# Patient Record
Sex: Male | Born: 1975 | Race: Black or African American | Hispanic: No | Marital: Single | Smoking: Former smoker
Health system: Southern US, Community
[De-identification: ages and names within clinical notes are randomized; demographics above are authoritative.]

## PROBLEM LIST (undated history)

## (undated) DIAGNOSIS — F329 Major depressive disorder, single episode, unspecified: Secondary | ICD-10-CM

## (undated) DIAGNOSIS — Z21 Asymptomatic human immunodeficiency virus [HIV] infection status: Secondary | ICD-10-CM

## (undated) DIAGNOSIS — H6690 Otitis media, unspecified, unspecified ear: Secondary | ICD-10-CM

## (undated) DIAGNOSIS — R0789 Other chest pain: Secondary | ICD-10-CM

## (undated) DIAGNOSIS — B20 Human immunodeficiency virus [HIV] disease: Secondary | ICD-10-CM

## (undated) DIAGNOSIS — A549 Gonococcal infection, unspecified: Secondary | ICD-10-CM

## (undated) DIAGNOSIS — I10 Essential (primary) hypertension: Secondary | ICD-10-CM

## (undated) DIAGNOSIS — J029 Acute pharyngitis, unspecified: Secondary | ICD-10-CM

## (undated) DIAGNOSIS — K219 Gastro-esophageal reflux disease without esophagitis: Secondary | ICD-10-CM

## (undated) DIAGNOSIS — J302 Other seasonal allergic rhinitis: Secondary | ICD-10-CM

## (undated) HISTORY — DX: Otitis media, unspecified, unspecified ear: H66.90

## (undated) HISTORY — DX: Other seasonal allergic rhinitis: J30.2

## (undated) HISTORY — DX: Acute pharyngitis, unspecified: J02.9

## (undated) HISTORY — DX: Major depressive disorder, single episode, unspecified: F32.9

---
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
## 2007-09-08 ENCOUNTER — Emergency Department (HOSPITAL_COMMUNITY): Admission: EM | Admit: 2007-09-08 | Discharge: 2007-09-08 | Payer: Self-pay | Admitting: Emergency Medicine

## 2011-03-16 ENCOUNTER — Emergency Department (HOSPITAL_COMMUNITY)
Admission: EM | Admit: 2011-03-16 | Discharge: 2011-03-16 | Disposition: A | Discharge status: Home / Self Care | Payer: Self-pay | Attending: Emergency Medicine | Admitting: Emergency Medicine

## 2011-03-16 DIAGNOSIS — R1084 Generalized abdominal pain: Secondary | ICD-10-CM | POA: Insufficient documentation

## 2011-03-16 DIAGNOSIS — R197 Diarrhea, unspecified: Secondary | ICD-10-CM | POA: Insufficient documentation

## 2011-03-16 DIAGNOSIS — R112 Nausea with vomiting, unspecified: Secondary | ICD-10-CM | POA: Insufficient documentation

## 2011-03-16 DIAGNOSIS — R42 Dizziness and giddiness: Secondary | ICD-10-CM | POA: Insufficient documentation

## 2011-03-16 LAB — DIFFERENTIAL
Basophils Relative: 0 % (ref 0–1)
Eosinophils Absolute: 0 10*3/uL (ref 0.0–0.7)
Eosinophils Relative: 0 % (ref 0–5)
Lymphs Abs: 1.4 10*3/uL (ref 0.7–4.0)
Monocytes Relative: 6 % (ref 3–12)

## 2011-03-16 LAB — CBC
HCT: 47.2 % (ref 39.0–52.0)
Hemoglobin: 16.6 g/dL (ref 13.0–17.0)
MCH: 32 pg (ref 26.0–34.0)
MCHC: 35.2 g/dL (ref 30.0–36.0)
MCV: 90.9 fL (ref 78.0–100.0)
Platelets: 264 10*3/uL (ref 150–400)
RBC: 5.19 MIL/uL (ref 4.22–5.81)
RDW: 11.9 % (ref 11.5–15.5)
WBC: 13.7 10*3/uL — ABNORMAL HIGH (ref 4.0–10.5)

## 2011-03-16 LAB — COMPREHENSIVE METABOLIC PANEL
AST: 20 U/L (ref 0–37)
Albumin: 4 g/dL (ref 3.5–5.2)
Calcium: 9.8 mg/dL (ref 8.4–10.5)
Creatinine, Ser: 0.87 mg/dL (ref 0.50–1.35)
GFR calc non Af Amer: >60 mL/min (ref >60)
Sodium: 138 mEq/L (ref 135–145)
Total Protein: 8.6 g/dL — ABNORMAL HIGH (ref 6.0–8.3)

## 2011-09-04 ENCOUNTER — Emergency Department (HOSPITAL_COMMUNITY)
Admission: EM | Admit: 2011-09-04 | Discharge: 2011-09-05 | Disposition: A | Discharge status: Home / Self Care | Payer: Self-pay | Attending: Emergency Medicine | Admitting: Emergency Medicine

## 2011-09-04 ENCOUNTER — Encounter (HOSPITAL_COMMUNITY): Payer: Self-pay | Admitting: *Deleted

## 2011-09-04 DIAGNOSIS — R599 Enlarged lymph nodes, unspecified: Secondary | ICD-10-CM | POA: Insufficient documentation

## 2011-09-04 DIAGNOSIS — J351 Hypertrophy of tonsils: Secondary | ICD-10-CM | POA: Insufficient documentation

## 2011-09-04 DIAGNOSIS — J36 Peritonsillar abscess: Secondary | ICD-10-CM

## 2011-09-04 DIAGNOSIS — R070 Pain in throat: Secondary | ICD-10-CM | POA: Insufficient documentation

## 2011-09-04 NOTE — ED Notes (Signed)
The pt  Has had a sorethroat for 4 days with a temp

## 2011-09-05 LAB — STREP A DNA PROBE: Group A Strep Probe: POSITIVE

## 2011-09-05 MED ORDER — HYDROCODONE-ACETAMINOPHEN 5-325 MG PO TABS
1.0000 | ORAL_TABLET | Freq: Once | ORAL | Status: AC
  Administered 2011-09-05: 1 via ORAL
  Filled 2011-09-05: qty 1

## 2011-09-05 MED ORDER — CLINDAMYCIN PHOSPHATE 900 MG/50ML IV SOLN
900.0000 mg | INTRAVENOUS | Status: AC
  Administered 2011-09-05: 900 mg via INTRAVENOUS
  Filled 2011-09-05: qty 50

## 2011-09-05 MED ORDER — SODIUM CHLORIDE 0.9 % IV BOLUS (SEPSIS)
1000.0000 mL | Freq: Once | INTRAVENOUS | Status: AC
  Administered 2011-09-05: 1000 mL via INTRAVENOUS

## 2011-09-05 MED ORDER — CLINDAMYCIN HCL 150 MG PO CAPS: 300.0000 mg | ORAL_CAPSULE | Freq: Three times a day (TID) | ORAL | Status: AC

## 2011-09-05 MED ORDER — HYDROCODONE-ACETAMINOPHEN 5-325 MG PO TABS: 1.0000 | ORAL_TABLET | Freq: Four times a day (QID) | ORAL | Status: AC | PRN

## 2011-09-05 NOTE — ED Notes (Signed)
Patient given copy of discharge paperwork; went over discharge instructions with patient.  Instructed patient to follow up with Dr. Annalee Genta at 9 am this morning, to take antibiotic and pain medication as directed, and to return to the ED for new, worsening, or concerning symptoms.

## 2011-09-05 NOTE — ED Provider Notes (Signed)
Medical screening examination/treatment/procedure(s) were performed by non-physician practitioner and as supervising physician I was immediately available for consultation/collaboration.  Flint Melter, MD 09/05/11 [phone]

## 2011-09-05 NOTE — ED Provider Notes (Signed)
Medical screening examination/treatment/procedure(s) were performed by non-physician practitioner and as supervising physician I was immediately available for consultation/collaboration.  Patient symptomatic for 5 days. He has clinical peritonsillar abscess and may exposure to streptococcal pharyngitis. Patient is currently mild distress. The PA will contact the ENT doctor to help arrange disposition/treatment.  Flint Melter, MD 09/05/11 Marlyne Beards

## 2011-09-05 NOTE — ED Notes (Signed)
Patient currently sitting up in bed; no respiratory or acute distress noted.  Patient updated on plan of care; informed patient that we are currently waiting on clindamycin to be delivered from pharmacy.  Patient has no other questions or concerns at this time; will continue to monitor.

## 2011-09-05 NOTE — ED Provider Notes (Signed)
History     CSN: [id number]  Arrival date & time 09/04/11  2215   First MD Initiated Contact with Patient 09/04/11 2328      Chief Complaint  Patient presents with  . Sore Throat    HPI  History provided by the patient. Patient is a 36 year old male with no significant past medical history who presents with complaints of sore throat for the past 4-5 days. Patient reports having acute onset of sore throat and taking a leftover Augmentin pill from old prescription and having some improvement of pains only to have return of significant right-sided sore throat and pain the past few days. he has been taking Tylenol and some ibuprofen for his pain without any improvement. He has been constant. Pain is worse with swallowing. Patient denies any difficulty swallowing or breathing. He reports feeling warm but has not taken his temperature. He denies having chills, sweats, nausea or vomiting. Patient has no other significant medical problems.   History reviewed. No pertinent past medical history.  History reviewed. No pertinent past surgical history.  History reviewed. No pertinent family history.  History  Substance Use Topics  . Smoking status: Current Everyday Smoker  . Smokeless tobacco: Not on file  . Alcohol Use: Yes      Review of Systems  Constitutional: Negative for fever, chills and diaphoresis.  HENT: Positive for sore throat.   Respiratory: Negative for cough.   Gastrointestinal: Negative for nausea and vomiting.  All other systems reviewed and are negative.    Allergies  Review of patient's allergies indicates no known allergies.  Home Medications   Current Outpatient Rx  Name Route Sig Dispense Refill  . ACETAMINOPHEN 500 MG PO TABS Oral Take 1,000 mg by mouth every 6 (six) hours as needed. For pain    . AMOXICILLIN-POT CLAVULANATE 875-125 MG PO TABS Oral Take 1 tablet by mouth daily.    . ADULT MULTIVITAMIN W/MINERALS CH Oral Take 1 tablet by mouth daily.       BP 130/91  Pulse 85  Temp(Src) 98.6 F (37 C) (Oral)  Resp 18  SpO2 97%  Physical Exam  Nursing note and vitals reviewed. Constitutional: He is oriented to person, place, and time. He appears well-developed and well-nourished.  HENT:  Head: Normocephalic and atraumatic.       Leftward deviation of the uvula with prominence of swelling around the right peritonsillar area. Tonsils with exudate. Pharynx erythematous. Voice is slightly muffled.  Neck: Normal range of motion. Neck supple. No tracheal deviation present.  Cardiovascular: Normal rate and regular rhythm.   Pulmonary/Chest: Effort normal and breath sounds normal. No respiratory distress. He has no wheezes. He has no rales.  Abdominal: Soft.  Lymphadenopathy:    He has cervical adenopathy.  Neurological: He is alert and oriented to person, place, and time.  Skin: Skin is warm. No rash noted. No erythema.  Psychiatric: He has a normal mood and affect. His behavior is normal.    ED Course  Procedures    Labs Reviewed  RAPID STREP SCREEN   Results for orders placed during the hospital encounter of 09/04/11  RAPID STREP SCREEN      Component Value Range   Streptococcus, Group A Screen (Direct) NEGATIVE  NEGATIVE       1. Peritonsillar abscess       MDM  11:45 PM patient seen and evaluated. Patient no acute distress.  Patient seen and discussed with attending physician. Plan to consult ENT specialist for further  recommendations.   12:30AM  I spoke with Dr. Annalee Genta on-call with ENT. He recommends giving the patient IV fluids to rehydrate him and to give 900 mg IV clindamycin. He would like to see patient in the office tomorrow. Patient has been instructed to call the office at 9 AM. Plan to also provide prescription for clindamycin and pain medication.      Angus Seller, [place] 09/05/11 [phone]

## 2011-09-05 NOTE — ED Notes (Signed)
Patient currently sitting up in bed; no respiratory or acute distress noted.  Patient updated on plan of care; informed patient that he can be discharged home as soon as his 1 liter NS bolus is finished.  Patient has no other questions or concerns at this time; family present at bedside.  Will continue to monitor.

## 2011-09-06 ENCOUNTER — Emergency Department (HOSPITAL_COMMUNITY)
Admission: EM | Admit: 2011-09-06 | Discharge: 2011-09-06 | Disposition: A | Discharge status: Home / Self Care | Payer: Self-pay | Attending: Otolaryngology | Admitting: Otolaryngology

## 2011-09-06 ENCOUNTER — Encounter (HOSPITAL_COMMUNITY): Payer: Self-pay | Admitting: Emergency Medicine

## 2011-09-06 DIAGNOSIS — R509 Fever, unspecified: Secondary | ICD-10-CM | POA: Insufficient documentation

## 2011-09-06 DIAGNOSIS — R259 Unspecified abnormal involuntary movements: Secondary | ICD-10-CM | POA: Insufficient documentation

## 2011-09-06 DIAGNOSIS — J36 Peritonsillar abscess: Secondary | ICD-10-CM | POA: Insufficient documentation

## 2011-09-06 DIAGNOSIS — R599 Enlarged lymph nodes, unspecified: Secondary | ICD-10-CM | POA: Insufficient documentation

## 2011-09-06 DIAGNOSIS — J351 Hypertrophy of tonsils: Secondary | ICD-10-CM | POA: Insufficient documentation

## 2011-09-06 DIAGNOSIS — J029 Acute pharyngitis, unspecified: Secondary | ICD-10-CM | POA: Insufficient documentation

## 2011-09-06 MED ORDER — CLINDAMYCIN PHOSPHATE 600 MG/4ML IJ SOLN
INTRAMUSCULAR | Status: AC
  Filled 2011-09-06: qty 4

## 2011-09-06 MED ORDER — DEXTROSE 5 % IV SOLN
INTRAVENOUS | Status: AC
  Filled 2011-09-06: qty 50

## 2011-09-06 MED ORDER — LIDOCAINE-EPINEPHRINE 1 %-1:100000 IJ SOLN
INTRAMUSCULAR | Status: AC
  Filled 2011-09-06: qty 1

## 2011-09-06 MED ORDER — CLINDAMYCIN PHOSPHATE 600 MG/50ML IV SOLN
600.0000 mg | INTRAVENOUS | Status: AC
  Administered 2011-09-06: 600 mg via INTRAVENOUS
  Filled 2011-09-06: qty 50

## 2011-09-06 MED ORDER — MORPHINE SULFATE 4 MG/ML IJ SOLN
4.0000 mg | Freq: Once | INTRAMUSCULAR | Status: AC
  Administered 2011-09-06: 4 mg via INTRAVENOUS
  Filled 2011-09-06: qty 1

## 2011-09-06 NOTE — Progress Notes (Signed)
Reason for Consult:Acute tonsillitis and Rt PTA Referring Physician: EDP  Jayquan Bradsher is an 36 y.o. male.  HPI: Hx of progressive ST and fever, pt txed with Cleocin. Cont trismus, fever and ST.  History reviewed. No pertinent past medical history.  History reviewed. No pertinent past surgical history.  No family history on file.  Social History:  reports that he has been smoking.  He does not have any smokeless tobacco history on file. He reports that he drinks alcohol. His drug history not on file.  Allergies: No Known Allergies  Medications: I have reviewed the patient's current medications.  Results for orders placed during the hospital encounter of 09/04/11 (from the past 48 hour(s))  RAPID STREP SCREEN     Status: Normal   Collection Time   09/04/11 10:14 PM      Component Value Range Comment   Streptococcus, Group A Screen (Direct) NEGATIVE  NEGATIVE    STREP A DNA PROBE     Status: Normal   Collection Time   09/04/11 10:14 PM      Component Value Range Comment   Specimen Description THROAT      Special Requests ADDED 09/05/11 0024      Group A Strep Probe POSITIVE      Report Status 09/06/2011 FINAL       No results found.  ROS:per ER adm  Blood pressure 145/97, pulse 99, temperature 100.4 F (38 C), temperature source Oral, resp. rate 18, SpO2 97.00%.  PHYSICAL EXAM: Overall appearance:  Healthy appearing, in no distress Head:  Normocephalic, atraumatic. Ears: External auditory canals are clear; tympanic membranes are intact in the middle ears are free of any effusion. Nose: External nose is healthy in appearance. Internal nasal exam free of any lesions or obstruction. Oral Cavity:  3+ tonsils with exudate, Rt PTA, trismus.  Neuro:  No identifiable neurologic deficits. Neck: Bil LA   Studies Reviewed:N/A  Assessment/Plan: I&D Rt PTA, no complications. Cont current po cleocin and prn pain meds, Motrin 600 tid, increase po fluids. Plan f/u in 2 - 3  wks  Arminta Gamm L 09/06/2011, 4:46 PM

## 2011-09-06 NOTE — ED Provider Notes (Signed)
Medical screening examination/treatment/procedure(s) were conducted as a shared visit with non-physician practitioner(s) and myself.  I personally evaluated the patient during the encounter   Joya Gaskins, MD 09/06/11 [phone]

## 2011-09-06 NOTE — ED Notes (Signed)
Dr Shoemaker at bedside. 

## 2011-09-06 NOTE — ED Notes (Signed)
Was seen on 1/13 for sore throat dx w/ peritonsilar abcess pt states is no better

## 2011-09-06 NOTE — ED Notes (Signed)
Awaiting dr Annalee Genta. ent cart and tray at bedside.

## 2011-09-06 NOTE — ED Notes (Signed)
Pt states got rx filled and has been taking

## 2011-09-06 NOTE — ED Provider Notes (Signed)
History     CSN: [id number]  Arrival date & time 09/06/11  1227   First MD Initiated Contact with Patient 09/06/11 1455      Chief Complaint  Patient presents with  . Sore Throat    Patient is a 36 y.o. male presenting with pharyngitis. The history is provided by the patient.  Sore Throat This is a recurrent problem. The current episode started more than 2 days ago. The problem occurs hourly. The problem has been gradually worsening. Pertinent negatives include no chest pain and no shortness of breath. The symptoms are aggravated by swallowing. The symptoms are relieved by nothing.  his course is worsening He reports recent diagnosis of peritonsillar abscess, started on antibiotics, was supposed to f/u with ENT, he called them and they told him to go to the ER for evaluation.    No cp/sob No drooling No focal weakness  PMH - none  History reviewed. No pertinent past surgical history.  No family history on file.  History  Substance Use Topics  . Smoking status: Current Everyday Smoker  . Smokeless tobacco: Not on file  . Alcohol Use: Yes      Review of Systems  Respiratory: Negative for shortness of breath.   Cardiovascular: Negative for chest pain.  All other systems reviewed and are negative.    Allergies  Review of patient's allergies indicates no known allergies.  Home Medications   Current Outpatient Rx  Name Route Sig Dispense Refill  . ACETAMINOPHEN 500 MG PO TABS Oral Take 1,000 mg by mouth every 6 (six) hours as needed. For pain    . CLINDAMYCIN HCL 150 MG PO CAPS Oral Take 2 capsules (300 mg total) by mouth 3 (three) times daily. 42 capsule 0  . HYDROCODONE-ACETAMINOPHEN 5-325 MG PO TABS Oral Take 1 tablet by mouth every 6 (six) hours as needed for pain. 30 tablet 0  . ADULT MULTIVITAMIN W/MINERALS CH Oral Take 1 tablet by mouth daily.      BP 145/97  Pulse 99  Temp(Src) 100.4 F (38 C) (Oral)  Resp 18  SpO2 97%  Physical Exam CONSTITUTIONAL:  Well developed/well nourished HEAD AND FACE: Normocephalic/atraumatic EYES: EOMI/PERRL ENMT: Mucous membranes moist, right PTA noted with deviation of uvula to left No stridor NECK: supple no meningeal signs SPINE:entire spine nontender CV: S1/S2 noted, no murmurs/rubs/gallops noted LUNGS: Lungs are clear to auscultation bilaterally, no apparent distress ABDOMEN: soft, nontender, no rebound or guarding GU:no cva tenderness NEURO: Pt is awake/alert, moves all extremitiesx4 EXTREMITIES: pulses normal, full ROM SKIN: warm, color normal PSYCH: no abnormalities of mood noted  ED Course  Procedures    3:26 PM D/w dr Annalee Genta, he is going to see patient in and drain PTA Pt stable at this time  MDM  Nursing notes reviewed and considered in documentation Previous records reviewed and considered         Joya Gaskins, MD 09/06/11 1526

## 2011-09-06 NOTE — ED Provider Notes (Signed)
Patient has been seen and treated by Dr. Annalee Genta.  His instructions are to continue current po cleocin and prn pain meds, Motrin 600 tid, increase po fluids. Plan f/u in 2 - 3 wks.   Jimmye Norman, NP 09/06/11 1845

## 2011-09-06 NOTE — Progress Notes (Signed)
09/06/2011 4:53 PM    Jennye Moccasin  [id number]   Pre-Op Dx:  Elinor Parkinson  Peritonsillar Abscess  Post-op Dx: same  Proc:  I&D Rt PTA  Surg: Kary Kos: local  EBL:  min  Comp:  none  Findings:  C/w PTA  Procedure: Local anesth. I&D Rt PTA for mod purulent d/c.  No complications  Dispo:  D/c to home  Plan:  Hydration, antibiosis, analgesia.   Given low anticipated risk of post-anesthetic or post-surgical complications, feel an outpatient venue is appropriate.  Osborn Coho, MD

## 2011-09-06 NOTE — ED Notes (Signed)
Consulting md to drain abcess at bedside.

## 2012-02-08 ENCOUNTER — Encounter (HOSPITAL_COMMUNITY): Payer: Self-pay | Admitting: Emergency Medicine

## 2012-02-08 ENCOUNTER — Emergency Department (HOSPITAL_COMMUNITY)
Admission: EM | Admit: 2012-02-08 | Discharge: 2012-02-08 | Disposition: A | Discharge status: Home / Self Care | Payer: BC Managed Care – PPO | Attending: Emergency Medicine | Admitting: Emergency Medicine

## 2012-02-08 DIAGNOSIS — F17200 Nicotine dependence, unspecified, uncomplicated: Secondary | ICD-10-CM | POA: Insufficient documentation

## 2012-02-08 DIAGNOSIS — J069 Acute upper respiratory infection, unspecified: Secondary | ICD-10-CM

## 2012-02-08 DIAGNOSIS — H6980 Other specified disorders of Eustachian tube, unspecified ear: Secondary | ICD-10-CM

## 2012-02-08 DIAGNOSIS — H6990 Unspecified Eustachian tube disorder, unspecified ear: Secondary | ICD-10-CM | POA: Insufficient documentation

## 2012-02-08 MED ORDER — AMOXICILLIN 500 MG PO CAPS: 500.0000 mg | ORAL_CAPSULE | Freq: Three times a day (TID) | ORAL | Status: AC

## 2012-02-08 MED ORDER — AMOXICILLIN 500 MG PO CAPS: 500.0000 mg | ORAL_CAPSULE | Freq: Three times a day (TID) | ORAL | Status: DC

## 2012-02-08 MED ORDER — FLUTICASONE PROPIONATE 50 MCG/ACT NA SUSP: 2.0000 | Freq: Every day | NASAL | Status: DC

## 2012-02-08 NOTE — Discharge Instructions (Signed)
Take motrin every 6 hours for discomfort. Return for worsening symptoms  RESOURCE GUIDE  Chronic Pain Problems: Contact [facility]  [phone] Patients need to be referred by their primary care doctor.  Insufficient Money for Medicine: Contact United Way:  call "211" or [facility] 8594999929.  No Primary Care Doctor: - Call [facility]  [phone] - can help you locate a primary care doctor that  accepts your insurance, provides certain services, etc. - Physician Referral Service- [phone]  Agencies that provide inexpensive medical care: - [facility]  [phone] - [facility]  [phone] - [facility]  [phone] - Women's Clinic  [phone] - Planned Parenthood  [phone] [facility]  [phone]  Medicaid-accepting [facility] Providers: - [facility]- [address]  [phone], Mon-Fri 9am-7pm, Sat 9am-1pm - [facility]- [address] [place], [address] [place]  [phone] - [facility]- [address] [place]  [phone] [facility]- [address]  [phone] - Renaye Rakers- [address] [place], [address], [phone]  Only accepts [place] patients after they have their name  applied to their card  Self Pay (no insurance) in [place]: - Sickle Cell Patients: Dr Willey Blade, [facility]  [address] [place], [phone] - [facility]- [address] [place]  [phone]       [facility] [place]- [address]       -     [facility]- see information above (Speak to [employer] if you do not have insurance)       -  [facility]- [address] [place], [phone]       -  [facility]- [address],  [phone]       -  [facility]- [address], [phone]       -  Dr Julio Sicks-  [address], [address], [place], [phone]       -  [facility]- [address] [phone]       -  [facility]- [address] [phone], also [address], [phone]       -    [facility]- [address] [place], [phone], 1st & 3rd Saturday   every month, 10am-1pm  1) Find a Doctor and Pay Out of Pocket Although you won't have to find out who is covered by your insurance plan, it is a good idea to ask around and get recommendations. You will then need to call the office and see if the doctor you have chosen will accept you as a new patient and what types of options they offer for patients who are self-pay. Some doctors offer discounts or will set up payment plans for their patients who do not have insurance, but you will need to ask so you aren't surprised when you get to your appointment.  2) Contact Your Local Health Department Not all health departments have doctors that can see patients for sick visits, but many do, so it is worth a call to see if yours does. If you don't know where your local health department is, you can check in your phone book. The CDC also has a tool to help  you locate your state's health department, and many state websites also have listings of all of their local health departments.  3) Find a Walk-in Clinic If your illness is not likely to be very severe or complicated, you may want to try a walk in clinic. These are popping up all over the country in pharmacies, drugstores, and shopping centers. They're usually staffed by nurse practitioners or physician assistants that have been trained to treat common illnesses and complaints. They're usually fairly quick and inexpensive. However, if you have serious medical issues or chronic medical problems, these are probably not your best option  STD Testing - [facility] Department of [facility] [place], STD Clinic, [address] [place], phone [phone] or [phone].   Monday - Friday, call for an appointment. [facility] Department of [employer], STD Clinic, [address], [place], phone [phone] or [phone].  Monday - Friday, call for an appointment.  Abuse/Neglect: [facility] Child Abuse Hotline 716-815-6143 [facility] Child Abuse Hotline 636 318 4732 (After Hours)  Emergency Shelter:  [facility] [phone]  Maternity Homes: - Room at the [place] [facility] 9597857098 - [facility] [phone]  MRSA Hotline #:   (616) 562-0647  [facility] Resources  [facility]  [employer] [facility] Dept. [address]                 [address]         [address] [place] [address]  Blondell Reveal Phone:  [phone]                                  Phone:  [phone]                   Phone:  [phone]  [facility], [phone] - [phone]       -     [facility] in [place], [address]                                  ([phone], [facility] Child Abuse Hotline 940-040-1236 or 360-127-1717 (After Hours)   Behavioral Health Services  Substance Abuse Resources: - [facility] Drug Services  805-094-0852 - [facility] [phone] - [facility] [phone] Floydene Flock [phone] - Residential & Outpatient Substance Abuse Program  [phone]  Psychological Services: [facility]  ([phone] Services  [phone] - [facility], [phone] [place]. [address] [place], ACCESS LINE: 8731556106 or ([phone], [employer]  Dental Assistance  If unable to pay or uninsured, contact:  [facility] or [facility]. to become qualified for the adult dental clinic.  Patients with Medicaid: [facility] [phone]  Sarina Ser, [phone] [address] [phone]  If unable to pay, or uninsured, contact [facility] (807)[phone]) or [facility] Department [phone] in [place], [phone] in [facility]) to become qualified for the adult dental clinic  Other Low-Cost Community Dental Services: - [facility]- [address] [place], [place], [place], [address], [phone], [address]. [address], 2nd and 4th Thursday of the month at 6:30am.  10 clients each day by appointment, can sometimes see walk-in patients if someone does not show for an appointment. [facility]- [address] [place], [place], [address], [phone] - [facility]- [address] [place], [place], [address], [phone] - [facility]- [phone] [facility] Health Department- [phone] [facility]- [phone]

## 2012-02-08 NOTE — ED Provider Notes (Signed)
Medical screening examination/treatment/procedure(s) were performed by non-physician practitioner and as supervising physician I was immediately available for consultation/collaboration.  Cheri Guppy, MD 02/08/12 [phone]

## 2012-02-08 NOTE — ED Notes (Signed)
Pt. Stated, I'm having ear pain for 2 weeks with nasal congestion and some chest pain.

## 2012-02-08 NOTE — ED Provider Notes (Signed)
History     CSN: [id number]  Arrival date & time 02/08/12  1000   First MD Initiated Contact with Patient 02/08/12 1103      Chief Complaint  Patient presents with  . Otalgia    (Consider location/radiation/quality/duration/timing/severity/associated sxs/prior treatment) Patient is a 36 y.o. male presenting with ear pain. The history is provided by the patient.  Otalgia The current episode started more than 1 week ago. There is pain in the left ear. The problem occurs daily. The problem has been gradually worsening. There has been no fever. Pertinent negatives include no cough.   36 y/o male iNAD c/o left ear pressure/popping sensation with purulent nasal drainage x2wks. Denies fever, cough and SOB. Pt also reports a sternal chest pain that predominantly occurs at night x2 months. No FH of cardiac disease. Pt also had single episode of NBNB emesis and diarrhea x2 days ago.    History reviewed. No pertinent past medical history.  History reviewed. No pertinent past surgical history.  No family history on file.  History  Substance Use Topics  . Smoking status: Current Everyday Smoker    Types: Cigarettes  . Smokeless tobacco: Not on file  . Alcohol Use: Yes      Review of Systems  Constitutional: Negative for fever.  HENT: Positive for ear pain.   Respiratory: Negative for cough and shortness of breath.   Cardiovascular: Positive for chest pain. Negative for palpitations and leg swelling.  All other systems reviewed and are negative.    Allergies  Review of patient's allergies indicates not on file.  Home Medications   Current Outpatient Rx  Name Route Sig Dispense Refill  . ADULT MULTIVITAMIN W/MINERALS CH Oral Take 1 tablet by mouth daily.      BP 136/87  Pulse 80  Temp 98 F (36.7 C) (Oral)  Resp 19  SpO2 97%  Physical Exam  Nursing note and vitals reviewed. Constitutional: He is oriented to person, place, and time. He appears well-developed and  well-nourished. No distress.  HENT:  Head: Normocephalic and atraumatic.  Right Ear: External ear normal.  Left Ear: External ear normal.  Nose: Nose normal.  Mouth/Throat: Oropharynx is clear and moist. No oropharyngeal exudate.       Air fluid level in both ears  Eyes: Conjunctivae and EOM are normal. Pupils are equal, round, and reactive to light.  Neck: Normal range of motion. Neck supple.  Cardiovascular: Normal rate and regular rhythm.   Pulmonary/Chest: Effort normal and breath sounds normal. No respiratory distress. He has no wheezes. He has no rales. He exhibits no tenderness.  Abdominal: Soft. Bowel sounds are normal.  Musculoskeletal: Normal range of motion.  Neurological: He is alert and oriented to person, place, and time.  Skin: Skin is warm and dry. He is not diaphoretic.  Psychiatric: He has a normal mood and affect. His behavior is normal.    ED Course  Procedures (including critical care time)  Labs Reviewed - No data to display No results found.   1. Eustachian tube dysfunction   2. URI (upper respiratory infection)       MDM  36 y/o mael with left ear pressure and runny nose worsening over the course of 2 weeks. Will treat with amoxicillin and fluticasone.  Pt verbalized understanding and agrees with care plan. Outpatient follow-up and return precautions given.           Wynetta Emery, PA-C 02/08/12 1209

## 2012-02-09 ENCOUNTER — Encounter (HOSPITAL_COMMUNITY): Payer: Self-pay | Admitting: Emergency Medicine

## 2012-08-14 ENCOUNTER — Emergency Department (HOSPITAL_COMMUNITY)
Admission: EM | Admit: 2012-08-14 | Discharge: 2012-08-14 | Disposition: A | Discharge status: Home / Self Care | Payer: BC Managed Care – PPO | Attending: Emergency Medicine | Admitting: Emergency Medicine

## 2012-08-14 ENCOUNTER — Encounter (HOSPITAL_COMMUNITY): Payer: Self-pay | Admitting: Emergency Medicine

## 2012-08-14 DIAGNOSIS — Z8619 Personal history of other infectious and parasitic diseases: Secondary | ICD-10-CM | POA: Insufficient documentation

## 2012-08-14 DIAGNOSIS — J309 Allergic rhinitis, unspecified: Secondary | ICD-10-CM | POA: Insufficient documentation

## 2012-08-14 DIAGNOSIS — A64 Unspecified sexually transmitted disease: Secondary | ICD-10-CM | POA: Insufficient documentation

## 2012-08-14 DIAGNOSIS — H9209 Otalgia, unspecified ear: Secondary | ICD-10-CM | POA: Insufficient documentation

## 2012-08-14 DIAGNOSIS — A549 Gonococcal infection, unspecified: Secondary | ICD-10-CM | POA: Insufficient documentation

## 2012-08-14 DIAGNOSIS — N342 Other urethritis: Secondary | ICD-10-CM

## 2012-08-14 DIAGNOSIS — F17200 Nicotine dependence, unspecified, uncomplicated: Secondary | ICD-10-CM | POA: Insufficient documentation

## 2012-08-14 DIAGNOSIS — Z7251 High risk heterosexual behavior: Secondary | ICD-10-CM | POA: Insufficient documentation

## 2012-08-14 DIAGNOSIS — R300 Dysuria: Secondary | ICD-10-CM | POA: Insufficient documentation

## 2012-08-14 DIAGNOSIS — J3489 Other specified disorders of nose and nasal sinuses: Secondary | ICD-10-CM | POA: Insufficient documentation

## 2012-08-14 HISTORY — DX: Gonococcal infection, unspecified: A54.9

## 2012-08-14 MED ORDER — CEFTRIAXONE SODIUM 250 MG IJ SOLR
250.0000 mg | Freq: Once | INTRAMUSCULAR | Status: AC
  Administered 2012-08-14: 250 mg via INTRAMUSCULAR
  Filled 2012-08-14: qty 250

## 2012-08-14 MED ORDER — FLUTICASONE PROPIONATE 50 MCG/ACT NA SUSP: 2.0000 | Freq: Every day | NASAL | Status: DC

## 2012-08-14 MED ORDER — AZITHROMYCIN 250 MG PO TABS
1000.0000 mg | ORAL_TABLET | Freq: Once | ORAL | Status: AC
  Administered 2012-08-14: 1000 mg via ORAL
  Filled 2012-08-14: qty 4

## 2012-08-14 MED ORDER — OXYMETAZOLINE HCL 0.05 % NA SOLN
1.0000 | Freq: Once | NASAL | Status: AC
  Administered 2012-08-14: 1 via NASAL
  Filled 2012-08-14: qty 15

## 2012-08-14 MED ORDER — LIDOCAINE HCL (PF) 1 % IJ SOLN
INTRAMUSCULAR | Status: AC
  Filled 2012-08-14: qty 5

## 2012-08-14 NOTE — ED Notes (Signed)
Pt c/o bilateral ear pain x 1.5 months; pt sts penile discharge x 3 days with burning with urination; pt sts has had unprotected sex

## 2012-08-14 NOTE — ED Notes (Signed)
Pt states "my nose is steady running and I cant breathe or smell through my nose and my ears feel like they on an airplane." Pt also c/o burning with urination and a yellow penile discharge.

## 2012-08-14 NOTE — ED Provider Notes (Signed)
History   This chart was scribed for Gwyneth Sprout, MD by Sofie Rower, ED Scribe. The patient was seen in room [facility] and the patient's care was started at 6:47PM.     CSN: [id number]  Arrival date & time 08/14/12  1729   First MD Initiated Contact with Patient 08/14/12 1847      Chief Complaint  Patient presents with  . Otalgia  . Penile Discharge    (Consider location/radiation/quality/duration/timing/severity/associated sxs/prior treatment) Patient is a 36 y.o. male presenting with ear pain and penile discharge. The history is provided by the patient. No language interpreter was used.  Otalgia The current episode started more than 1 week ago. There is pain in both ears. The problem occurs constantly. The problem has been gradually worsening. There has been no fever. The pain is moderate. Pertinent negatives include no headaches and no abdominal pain.  Penile Discharge This is a new problem. The current episode started more than 2 days ago. The problem occurs constantly. The problem has been gradually worsening. Pertinent negatives include no abdominal pain and no headaches. Nothing aggravates the symptoms. Nothing relieves the symptoms. He has tried nothing for the symptoms. The treatment provided no relief.    Cody Barton is a 36 y.o. male , with a hx of STD (diagnosed 5 years ago), who presents to the Emergency Department complaining of gradual, progressively worsening, otalgia, located at the bilateral ears, onset 1.5 months ago.  Associated symptoms include rhinorrhea. The pt has taken nasal spray which does not provide relief of his otalgia. Furthermore, the pt presents to the emergency department complaining of sudden, progressively worsening yellow penile discharge, onset three days ago (08/11/12). Associated symptoms include dysuria. The pt reports he had unprotected sex two weeks ago and has two separate sexual partners.  The pt denies fever and abdominal pain. The pt also  denies any allergy to medications.   The pt is a current everyday smoker, in addition to drinking alcohol.       Past Medical History  Diagnosis Date  . Gonorrhea     History reviewed. No pertinent past surgical history.  History reviewed. No pertinent family history.  History  Substance Use Topics  . Smoking status: Current Every Day Smoker  . Smokeless tobacco: Not on file  . Alcohol Use: Yes      Review of Systems  HENT: Positive for ear pain.   Gastrointestinal: Negative for abdominal pain.  Genitourinary: Positive for dysuria and discharge.  Neurological: Negative for headaches.  All other systems reviewed and are negative.    Allergies  Review of patient's allergies indicates no known allergies.  Home Medications  No current outpatient prescriptions on file.  BP 163/91  Pulse 75  Temp 97.9 F (36.6 C) (Oral)  Resp 18  SpO2 97%  Physical Exam  Nursing note and vitals reviewed. Constitutional: He is oriented to person, place, and time. He appears well-developed and well-nourished.  HENT:  Head: Atraumatic.  Right Ear: Tympanic membrane is not erythematous.  Left Ear: Tympanic membrane is not erythematous.  Nose: Mucosal edema and rhinorrhea present.       Fluid detected within the bilateral ears.   Eyes: EOM are normal.  Neck: Normal range of motion.  Cardiovascular: Normal rate.   Pulmonary/Chest: Effort normal.  Genitourinary: Discharge (yellow) found.       No penile lesions. Testicles normal. Non-tender.   Musculoskeletal: Normal range of motion.  Neurological: He is alert and oriented to person, place, and time.  Skin: Skin is warm and dry.  Psychiatric: He has a normal mood and affect. His behavior is normal.    ED Course  Procedures (including critical care time)  DIAGNOSTIC STUDIES: Oxygen Saturation is 97% on room air, normal by my interpretation.    COORDINATION OF CARE:  7:03 PM- Treatment plan discussed with patient. Pt  agrees with treatment.     Labs Reviewed - No data to display No results found.   1. Urethritis   2. STD (male)   3. Allergic rhinitis       MDM   Patient here with 2 issues. The first issue is penile discharge for 3 days after having unprotected sex approximately 2 weeks ago. Patient has sex with males only. He is unaware of his partner having any symptoms. He declined syphilis and HIV testing today. Patient was given Rocephin and azithromycin and GC Chlamydia culture was sent. Secondly patient's complaining of fullness in his ear some persistent rhinorrhea. He has evidence of allergic rhinitis with bilateral ear effusions. He was given afrin and a nasal steroid      I personally performed the services described in this documentation, which was scribed in my presence.  The recorded information has been reviewed and considered.    Gwyneth Sprout, MD 08/14/12 [phone]

## 2012-08-16 ENCOUNTER — Encounter (HOSPITAL_COMMUNITY): Payer: Self-pay | Admitting: Emergency Medicine

## 2012-08-26 ENCOUNTER — Emergency Department (HOSPITAL_COMMUNITY)
Admission: EM | Admit: 2012-08-26 | Discharge: 2012-08-26 | Disposition: A | Discharge status: Home / Self Care | Payer: Self-pay | Attending: Emergency Medicine | Admitting: Emergency Medicine

## 2012-08-26 ENCOUNTER — Encounter (HOSPITAL_COMMUNITY): Payer: Self-pay | Admitting: Emergency Medicine

## 2012-08-26 DIAGNOSIS — H9390 Unspecified disorder of ear, unspecified ear: Secondary | ICD-10-CM | POA: Insufficient documentation

## 2012-08-26 DIAGNOSIS — H6590 Unspecified nonsuppurative otitis media, unspecified ear: Secondary | ICD-10-CM

## 2012-08-26 DIAGNOSIS — J020 Streptococcal pharyngitis: Secondary | ICD-10-CM

## 2012-08-26 DIAGNOSIS — H938X9 Other specified disorders of ear, unspecified ear: Secondary | ICD-10-CM

## 2012-08-26 DIAGNOSIS — F17200 Nicotine dependence, unspecified, uncomplicated: Secondary | ICD-10-CM | POA: Insufficient documentation

## 2012-08-26 DIAGNOSIS — Z8619 Personal history of other infectious and parasitic diseases: Secondary | ICD-10-CM | POA: Insufficient documentation

## 2012-08-26 MED ORDER — IBUPROFEN 600 MG PO TABS: 600.0000 mg | ORAL_TABLET | Freq: Four times a day (QID) | ORAL | Status: DC | PRN

## 2012-08-26 MED ORDER — LORATADINE 10 MG PO TABS
10.0000 mg | ORAL_TABLET | Freq: Once | ORAL | Status: AC
  Administered 2012-08-26: 10 mg via ORAL
  Filled 2012-08-26: qty 1

## 2012-08-26 MED ORDER — IBUPROFEN 400 MG PO TABS
800.0000 mg | ORAL_TABLET | Freq: Once | ORAL | Status: AC
  Administered 2012-08-26: 800 mg via ORAL
  Filled 2012-08-26: qty 2

## 2012-08-26 MED ORDER — HYDROCODONE-ACETAMINOPHEN 5-325 MG PO TABS: 1.0000 | ORAL_TABLET | Freq: Four times a day (QID) | ORAL | Status: DC | PRN

## 2012-08-26 MED ORDER — DEXAMETHASONE 2 MG PO TABS
10.0000 mg | ORAL_TABLET | Freq: Once | ORAL | Status: AC
  Administered 2012-08-26: 10 mg via ORAL
  Filled 2012-08-26: qty 5

## 2012-08-26 MED ORDER — DYCLONINE HCL 3 MG MT LOZG: 1.0000 | LOZENGE | OROMUCOSAL | Status: DC | PRN

## 2012-08-26 MED ORDER — PSEUDOEPHEDRINE HCL 60 MG PO TABS
30.0000 mg | ORAL_TABLET | ORAL | Status: DC | PRN
Start: 2012-08-26 — End: 2013-03-21

## 2012-08-26 MED ORDER — PSEUDOEPHEDRINE HCL 60 MG PO TABS
60.0000 mg | ORAL_TABLET | Freq: Once | ORAL | Status: AC
  Administered 2012-08-26: 60 mg via ORAL
  Filled 2012-08-26: qty 1

## 2012-08-26 MED ORDER — PENICILLIN G BENZATHINE 1200000 UNIT/2ML IM SUSP
1.2000 10*6.[IU] | Freq: Once | INTRAMUSCULAR | Status: AC
  Administered 2012-08-26: 1.2 10*6.[IU] via INTRAMUSCULAR
  Filled 2012-08-26: qty 2

## 2012-08-26 MED ORDER — BENZOCAINE-MENTHOL 15-10 MG MT LOZG: 1.0000 | LOZENGE | OROMUCOSAL | Status: DC | PRN

## 2012-08-26 NOTE — ED Notes (Signed)
Pt reports sore throat just x 2 days and ear pain for 2 months. Painful upon swallowing.

## 2012-08-27 NOTE — ED Provider Notes (Signed)
History     CSN: [id number]  Arrival date & time 08/26/12  1410   First MD Initiated Contact with Patient 08/26/12 1453      Chief Complaint  Patient presents with  . Sore Throat  . Otalgia    (Consider location/radiation/quality/duration/timing/severity/associated sxs/prior treatment) HPIConstantine Barton is a 37 y.o. male presenting with severe sore throat pain and swelling for 2 days not responding to OTC medications and ear pain for 2 months.  He also describes a history of congestion and rhinorrhea over past few days as well.  Has had subjective fevers and cervical LAD.  No problems breathing or handling secretions, no sensation of being choked. Denies chest pain, dyspnea, N/V/D.  Past Medical History  Diagnosis Date  . Gonorrhea     No past surgical history on file.  No family history on file.  History  Substance Use Topics  . Smoking status: Current Every Day Smoker    Types: Cigarettes  . Smokeless tobacco: Not on file  . Alcohol Use: Yes      Review of Systems At least 10pt or greater review of systems completed and are negative except where specified in the HPI.  Allergies  Review of patient's allergies indicates no known allergies.  Home Medications   Current Outpatient Rx  Name  Route  Sig  Dispense  Refill  . FLUTICASONE PROPIONATE 50 MCG/ACT NA SUSP   Nasal   Place 2 sprays into the nose daily.   16 g   2   . ADULT MULTIVITAMIN W/MINERALS CH   Oral   Take 1 tablet by mouth daily.         Marland Kitchen BENZOCAINE-MENTHOL 15-10 MG MT LOZG   Mouth/Throat   Use as directed 1 lozenge in the mouth or throat every 2 (two) hours as needed (for sore throat).   15 lozenge   0   . DYCLONINE HCL 3 MG MT LOZG   Mouth/Throat   Use as directed 1 lozenge (3 mg total) in the mouth or throat every 2 (two) hours as needed (sore throat).   18 each   0   . HYDROCODONE-ACETAMINOPHEN 5-325 MG PO TABS   Oral   Take 1-2 tablets by mouth every 6 (six) hours as needed  for pain.   17 tablet   0   . IBUPROFEN 600 MG PO TABS   Oral   Take 1 tablet (600 mg total) by mouth every 6 (six) hours as needed for pain.   30 tablet   0   . PSEUDOEPHEDRINE HCL 60 MG PO TABS   Oral   Take 0.5-1 tablets (30-60 mg total) by mouth every 4 (four) hours as needed for congestion.   30 tablet   0     BP 157/109  Pulse 90  Temp 98.6 F (37 C) (Oral)  Resp 18  SpO2 97%  Physical Exam  Nursing notes reviewed.  Electronic medical record reviewed. VITAL SIGNS:   Filed Vitals:   08/26/12 1418  BP: 157/109  Pulse: 90  Temp: 98.6 F (37 C)  TempSrc: Oral  Resp: 18  SpO2: 97%   CONSTITUTIONAL: Awake, oriented, appears non-toxic HENT: Atraumatic, normocephalic, oral mucosa pink and moist, airway patent. Tonsils enlarged, right slightly larger than left, almost touching. No tonsillar exudate, mild erythema.  And congestion with moderate coryza. RhinorrheaExternal ears normal, serous otitis media. EYES: Conjunctiva clear, EOMI, PERRLA NECK: Trachea midline, non-tender in the midline, supple. Anterior LAD mildly TTP. CARDIOVASCULAR: Normal heart rate,  Normal rhythm, No murmurs, rubs, gallops PULMONARY/CHEST: Clear to auscultation, no rhonchi, wheezes, or rales. Symmetrical breath sounds. Non-tender. ABDOMINAL: Non-distended, soft, non-tender - no rebound or guarding.  BS normal. NEUROLOGIC: Non-focal, moving all four extremities, no gross sensory or motor deficits. EXTREMITIES: No clubbing, cyanosis, or edema SKIN: Warm, Dry, No erythema, No rash  ED Course  Procedures (including critical care time)  Labs Reviewed - No data to display No results found.   1. Strep pharyngitis   2. Ear fullness   3. Serous otitis media       MDM  Cody Barton is a 37 y.o. male presenting with likely streptococcal pharyngitis.  Testing will not alter my treatment course - will treat with IM PCN.  Will also treat with topical anesthetic cough drops and pain  medicine.  Pt will also need systemic decongestant for serous OM.  No evidence for PTA or RPA at this time.    I explained the diagnosis and have given explicit precautions to return to the ER including difficulty breathing or swallowing saliva or any other new or worsening symptoms. The patient understands and accepts the medical plan as it's been dictated and I have answered his questions. Discharge instructions concerning home care and prescriptions have been given.  The patient is STABLE and is discharged to home in good condition.         Jones Skene, MD 08/27/12 1342

## 2012-08-30 ENCOUNTER — Ambulatory Visit (INDEPENDENT_AMBULATORY_CARE_PROVIDER_SITE_OTHER): Payer: BC Managed Care – PPO

## 2012-08-30 DIAGNOSIS — B20 Human immunodeficiency virus [HIV] disease: Secondary | ICD-10-CM

## 2012-08-30 DIAGNOSIS — Z23 Encounter for immunization: Secondary | ICD-10-CM

## 2012-08-30 DIAGNOSIS — Z111 Encounter for screening for respiratory tuberculosis: Secondary | ICD-10-CM

## 2012-08-30 DIAGNOSIS — F419 Anxiety disorder, unspecified: Secondary | ICD-10-CM

## 2012-08-30 DIAGNOSIS — F329 Major depressive disorder, single episode, unspecified: Secondary | ICD-10-CM

## 2012-08-30 LAB — LIPID PANEL
LDL Cholesterol: 78 mg/dL (ref 0–99)
Triglycerides: 160 mg/dL — ABNORMAL HIGH (ref <150)
VLDL: 32 mg/dL (ref 0–40)

## 2012-08-31 DIAGNOSIS — Z23 Encounter for immunization: Secondary | ICD-10-CM

## 2012-08-31 LAB — URINALYSIS
Hgb urine dipstick: NEGATIVE
Ketones, ur: NEGATIVE mg/dL
Nitrite: NEGATIVE
Protein, ur: NEGATIVE mg/dL
Specific Gravity, Urine: 1.028 (ref 1.005–1.030)
Urobilinogen, UA: 0.2 mg/dL (ref 0.0–1.0)

## 2012-08-31 LAB — CBC WITH DIFFERENTIAL/PLATELET
Basophils Absolute: 0.1 10*3/uL (ref 0.0–0.1)
Eosinophils Relative: 1 % (ref 0–5)
Lymphocytes Relative: 34 % (ref 12–46)
MCV: 90.5 fL (ref 78.0–100.0)
Neutrophils Relative %: 52 % (ref 43–77)
Platelets: 333 10*3/uL (ref 150–400)
RDW: 12.3 % (ref 11.5–15.5)
WBC: 7.2 10*3/uL (ref 4.0–10.5)

## 2012-08-31 LAB — COMPLETE METABOLIC PANEL WITH GFR
ALT: 10 U/L (ref 0–53)
AST: 12 U/L (ref 0–37)
Calcium: 9.6 mg/dL (ref 8.4–10.5)
Chloride: 103 mEq/L (ref 96–112)
Creat: 0.87 mg/dL (ref 0.50–1.35)
Total Bilirubin: 0.5 mg/dL (ref 0.3–1.2)

## 2012-08-31 LAB — HEPATITIS B CORE ANTIBODY, TOTAL: Hep B Core Total Ab: NEGATIVE

## 2012-08-31 LAB — HEPATITIS B SURFACE ANTIGEN: Hepatitis B Surface Ag: NEGATIVE

## 2012-08-31 LAB — HEPATITIS C ANTIBODY: HCV Ab: NEGATIVE

## 2012-08-31 LAB — HEPATITIS A ANTIBODY, TOTAL: Hep A Total Ab: NEGATIVE

## 2012-08-31 NOTE — Progress Notes (Signed)
Pt state he was in a previous relationship for 2 years and thinks his partner knew he was positive since he always refused testing.   No condom use.   Laurell Josephs, RN

## 2012-09-02 LAB — HIV-1 RNA ULTRAQUANT REFLEX TO GENTYP+
HIV 1 RNA Quant: 28733 copies/mL — ABNORMAL HIGH (ref <20)
HIV-1 RNA Quant, Log: 4.46 {Log} — ABNORMAL HIGH (ref <1.30)

## 2012-09-03 LAB — TB SKIN TEST
Induration: 0 mm
TB Skin Test: NEGATIVE

## 2012-09-13 ENCOUNTER — Ambulatory Visit: Payer: Self-pay

## 2012-09-13 ENCOUNTER — Ambulatory Visit: Payer: BC Managed Care – PPO

## 2012-09-13 ENCOUNTER — Telehealth: Payer: Self-pay | Admitting: *Deleted

## 2012-09-13 ENCOUNTER — Ambulatory Visit: Payer: Self-pay | Admitting: Infectious Disease

## 2012-09-13 NOTE — Telephone Encounter (Signed)
Attempted to call patient 4 times at only number listed. Phone was busy each time.  Patient referred to [facility]. Andree Coss, RN

## 2012-09-17 ENCOUNTER — Ambulatory Visit: Payer: Self-pay | Admitting: Infectious Disease

## 2012-10-10 ENCOUNTER — Ambulatory Visit (INDEPENDENT_AMBULATORY_CARE_PROVIDER_SITE_OTHER): Payer: Self-pay | Admitting: Infectious Disease

## 2012-10-10 ENCOUNTER — Encounter: Payer: Self-pay | Admitting: Infectious Disease

## 2012-10-10 VITALS — BP 150/104 | HR 73 | Temp 98.7°F | Ht 73.0 in | Wt 186.0 lb

## 2012-10-10 DIAGNOSIS — I10 Essential (primary) hypertension: Secondary | ICD-10-CM | POA: Insufficient documentation

## 2012-10-10 DIAGNOSIS — B20 Human immunodeficiency virus [HIV] disease: Secondary | ICD-10-CM | POA: Insufficient documentation

## 2012-10-10 MED ORDER — AMLODIPINE BESYLATE 5 MG PO TABS: 5.0000 mg | ORAL_TABLET | Freq: Every day | ORAL | Status: DC

## 2012-10-10 MED ORDER — EMTRICITAB-RILPIVIR-TENOFOV DF 200-25-300 MG PO TABS: 1.0000 | ORAL_TABLET | Freq: Every day | ORAL | Status: DC

## 2012-10-10 NOTE — Progress Notes (Signed)
  Subjective:    Patient ID: Cody Barton, male    DOB: [birth year], 37 y.o.   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 We had extensive discussion of first line that DHHS guidelines therapy and ultimately decided to treat him with spelled COMPLEra. We  spent greater than 60 minutes with the patient including greater than 50% of time in face to face counsel of the patient and in coordination of their care.    Review of Systems  Constitutional: Negative for fever, chills, diaphoresis, activity change, appetite change, fatigue and unexpected weight change.  HENT: Negative for congestion, sore throat, rhinorrhea, sneezing, trouble swallowing and sinus pressure.   Eyes: Negative for photophobia and visual disturbance.  Respiratory: Negative for cough, chest tightness, shortness of breath, wheezing and stridor.   Cardiovascular: Negative for chest pain, palpitations and leg swelling.  Gastrointestinal: Negative for nausea, vomiting, abdominal pain, diarrhea, constipation, blood in stool, abdominal distention and anal bleeding.  Genitourinary: Negative for dysuria, hematuria, flank pain and difficulty urinating.  Musculoskeletal: Positive for arthralgias. Negative for myalgias, back pain, joint swelling and gait problem.  Skin: Negative for color change, pallor, rash and wound.  Neurological: Negative for dizziness, tremors, weakness and light-headedness.  Hematological: Negative for adenopathy. Does not bruise/bleed easily.  Psychiatric/Behavioral: Negative for behavioral problems, confusion, sleep disturbance, dysphoric mood, decreased concentration and agitation.       Objective:   Physical Exam  Constitutional: He is oriented to person, place, and time. He appears well-nourished. No distress.  HENT:  Head: Normocephalic and atraumatic.  Mouth/Throat: Oropharynx is clear  and moist. No oropharyngeal exudate.  Eyes: Conjunctivae and EOM are normal. No scleral icterus.  Neck: Normal range of motion. Neck supple.  Cardiovascular: Normal rate, regular rhythm and normal heart sounds.  Exam reveals no gallop and no friction rub.   No murmur heard. Pulmonary/Chest: Effort normal and breath sounds normal. No respiratory distress. He has no wheezes. He has no rales. He exhibits no tenderness.  Abdominal: He exhibits no distension and no mass. There is no tenderness. There is no rebound and no guarding.  Musculoskeletal: He exhibits no edema and no tenderness.       Right shoulder: He exhibits normal range of motion, no tenderness and no bony tenderness.  Neurological: He is alert and oriented to person, place, and time. He exhibits normal muscle tone. Coordination normal.  Skin: Skin is warm and dry. He is not diaphoretic.  Psychiatric: He has a normal mood and affect. His behavior is normal. Judgment and thought content normal.          Assessment & Plan:  HIV: start complera rtc in one month HTN: start norvasc  HCM: hepa and b vax

## 2012-10-22 ENCOUNTER — Ambulatory Visit: Payer: Self-pay

## 2012-10-22 ENCOUNTER — Other Ambulatory Visit: Payer: Self-pay | Admitting: *Deleted

## 2012-10-22 MED ORDER — AMLODIPINE BESYLATE 5 MG PO TABS: 5.0000 mg | ORAL_TABLET | Freq: Every day | ORAL | Status: DC

## 2012-10-22 MED ORDER — EMTRICITAB-RILPIVIR-TENOFOV DF 200-25-300 MG PO TABS: 1.0000 | ORAL_TABLET | Freq: Every day | ORAL | Status: DC

## 2012-11-05 ENCOUNTER — Telehealth: Payer: Self-pay | Admitting: *Deleted

## 2012-11-05 NOTE — Telephone Encounter (Signed)
Left message to notify patient that his ADAP was approved and asking where he would like [place] to send the prescription. Andree Coss, RN

## 2012-11-08 ENCOUNTER — Telehealth: Payer: Self-pay | Admitting: *Deleted

## 2012-11-08 ENCOUNTER — Other Ambulatory Visit: Payer: Self-pay | Admitting: *Deleted

## 2012-11-08 MED ORDER — EMTRICITAB-RILPIVIR-TENOFOV DF 200-25-300 MG PO TABS: 1.0000 | ORAL_TABLET | Freq: Every day | ORAL | Status: DC

## 2012-11-08 NOTE — Telephone Encounter (Signed)
Patient is ADAP approved.  He gave his new phone number to K. Poff, now also documented in his chart.  RN called patient to let him know the [facility] location closest to him does not participate in the ADAP program and if he wants to pick the medication up he will have to use the one on [address] and [address].  Patient initially answered the phone call, but hung up after I identified myself.  I called back and left the information in a voicemail.  Medication has been sent to [facility] Andree Coss, RN

## 2012-11-09 ENCOUNTER — Other Ambulatory Visit: Payer: Self-pay | Admitting: *Deleted

## 2012-11-09 DIAGNOSIS — B20 Human immunodeficiency virus [HIV] disease: Secondary | ICD-10-CM

## 2012-11-09 MED ORDER — EMTRICITAB-RILPIVIR-TENOFOV DF 200-25-300 MG PO TABS: 1.0000 | ORAL_TABLET | Freq: Every day | ORAL | Status: DC

## 2012-12-17 ENCOUNTER — Other Ambulatory Visit: Payer: Self-pay

## 2012-12-31 ENCOUNTER — Other Ambulatory Visit: Payer: Self-pay | Admitting: *Deleted

## 2012-12-31 ENCOUNTER — Encounter: Payer: Self-pay | Admitting: Infectious Disease

## 2012-12-31 ENCOUNTER — Ambulatory Visit (INDEPENDENT_AMBULATORY_CARE_PROVIDER_SITE_OTHER): Payer: Self-pay | Admitting: Infectious Disease

## 2012-12-31 VITALS — BP 116/74 | HR 93 | Temp 98.0°F | Ht 73.0 in | Wt 186.0 lb

## 2012-12-31 DIAGNOSIS — J302 Other seasonal allergic rhinitis: Secondary | ICD-10-CM

## 2012-12-31 DIAGNOSIS — B20 Human immunodeficiency virus [HIV] disease: Secondary | ICD-10-CM

## 2012-12-31 DIAGNOSIS — F1090 Alcohol use, unspecified, uncomplicated: Secondary | ICD-10-CM

## 2012-12-31 DIAGNOSIS — Z7289 Other problems related to lifestyle: Secondary | ICD-10-CM

## 2012-12-31 DIAGNOSIS — J309 Allergic rhinitis, unspecified: Secondary | ICD-10-CM

## 2012-12-31 DIAGNOSIS — Z113 Encounter for screening for infections with a predominantly sexual mode of transmission: Secondary | ICD-10-CM

## 2012-12-31 DIAGNOSIS — F1010 Alcohol abuse, uncomplicated: Secondary | ICD-10-CM

## 2012-12-31 LAB — COMPLETE METABOLIC PANEL WITH GFR
Albumin: 3.8 g/dL (ref 3.5–5.2)
BUN: 13 mg/dL (ref 6–23)
Calcium: 9.7 mg/dL (ref 8.4–10.5)
Chloride: 107 mEq/L (ref 96–112)
Creat: 1.02 mg/dL (ref 0.50–1.35)
GFR, Est African American: >89 mL/min
GFR, Est Non African American: >89 mL/min
Glucose, Bld: 91 mg/dL (ref 70–99)
Potassium: 4.2 mEq/L (ref 3.5–5.3)

## 2012-12-31 LAB — CBC WITH DIFFERENTIAL/PLATELET
Eosinophils Relative: 0 % (ref 0–5)
HCT: 41 % (ref 39.0–52.0)
Hemoglobin: 13.8 g/dL (ref 13.0–17.0)
Lymphocytes Relative: 39 % (ref 12–46)
MCHC: 33.7 g/dL (ref 30.0–36.0)
MCV: 91.5 fL (ref 78.0–100.0)
Monocytes Absolute: 0.5 10*3/uL (ref 0.1–1.0)
Monocytes Relative: 8 % (ref 3–12)
Neutro Abs: 3.2 10*3/uL (ref 1.7–7.7)
RBC: 4.48 MIL/uL (ref 4.22–5.81)

## 2012-12-31 MED ORDER — FLUTICASONE PROPIONATE 50 MCG/ACT NA SUSP: 2.0000 | Freq: Every day | NASAL | Status: DC

## 2012-12-31 NOTE — Progress Notes (Signed)
HPI: Cody Barton is a 37 y.o. male presenting for his first follow up for HIV after starting ART with Complera. He reports having missed his medications ~3 days since starting them 2 months prior. He had concerns about taking the medications while being intoxicated or when not eating full meals and cites these as why he missed his medications. He has also experienced some GI upset and fatigue with the medication and says his stools are different but not loose.   Allergies: No Known Allergies  Vitals: Temp: 98 F (36.7 C) (05/12 0935) Temp src: Oral (05/12 0935) BP: 116/74 mmHg (05/12 0935) Pulse Rate: 93 (05/12 0935)  Past Medical History: Past Medical History  Diagnosis Date  . Gonorrhea     Social History: History   Social History  . Marital Status: Single    Spouse Name: N/A    Number of Children: N/A  . Years of Education: N/A   Social History Main Topics  . Smoking status: Current Every Day Smoker -- 0.30 packs/day for 1.5 years    Types: Cigars  . Smokeless tobacco: Never Used  . Alcohol Use: 150.0 oz/week    300 drink(s) per week     Comment: ciroc and grey goose   . Drug Use: 3.00 per week    Special: Marijuana     Comment: daily use   . Sexually Active: Not Currently -- Male partner(s)    Birth Control/ Protection: None   Other Topics Concern  . None   Social History Narrative   ** Merged History Encounter **       ** Merged History Encounter **        Current Regimen: Complera  Labs: HIV 1 RNA Quant (copies/mL)  Date Value  08/30/2012 28733*     CD4 T Cell Abs (cmm)  Date Value  08/30/2012 750      Hep B S Ab (no units)  Date Value  08/30/2012 NONREACTIVE      Hepatitis B Surface Ag (no units)  Date Value  08/30/2012 NEGATIVE      HCV Ab (no units)  Date Value  08/30/2012 NEGATIVE     CrCl: Estimated Creatinine Clearance: 133.9 ml/min (by C-G formula based on Cr of 0.87).  Lipids:    Component Value Date/Time   CHOL 141  08/30/2012 1634   TRIG 160* 08/30/2012 1634   HDL 31* 08/30/2012 1634   CHOLHDL 4.5 08/30/2012 1634   VLDL 32 08/30/2012 1634   LDLCALC 78 08/30/2012 1634    Assessment: Patient seems to have a somewhat chaotic life and was very concerned with how he took his medications. He was counseled on why daily adherence is important and instructed to call with questions if they will inhibit him from taking his medication. He was told that while not ideal to take meds while intoxicated that it was better than skipping and when not hungry to just have a small snack with the medication. Also spoke about not missing doses gives the body time to adapt and often side effects go away if adherent.  Recommendations: - Continue Complera once daily - Would have a low threshold to change medications and if still missing doses next time would start something with a higher barrier to resistance (PI based or possibly dolutegravir based) - If still having side effects but adherent could consider stribild as another once daily alternative to see if he tolerates better  Drue Stager, PharmD Clinical Infectious Disease Pharmacist [facility]  12/31/2012, 11:22 AM   I examined the patient and reviewed his labs extensively with him. He has been suffering also from some seasonal rhinitis.  We will recheck HIV labs today and have him rtc in one month for recheck labs  I am sending in rx for inhaled flonase for his allergies and encouraged inhaled afrin short term  IN all we spent greater than 45 minutes with the patient including greater than 50% of time in face to face counsel of the patient and in coordination of their care.

## 2013-01-01 LAB — T-HELPER CELL (CD4) - (RCID CLINIC ONLY)
CD4 % Helper T Cell: 26 % — ABNORMAL LOW (ref 33–55)
CD4 T Cell Abs: 630 uL (ref 400–2700)

## 2013-01-01 LAB — HIV-1 RNA ULTRAQUANT REFLEX TO GENTYP+: HIV 1 RNA Quant: 24 copies/mL — ABNORMAL HIGH (ref <20)

## 2013-02-04 ENCOUNTER — Other Ambulatory Visit: Payer: Self-pay

## 2013-02-04 DIAGNOSIS — B20 Human immunodeficiency virus [HIV] disease: Secondary | ICD-10-CM

## 2013-02-04 LAB — COMPLETE METABOLIC PANEL WITH GFR
Alkaline Phosphatase: 86 U/L (ref 39–117)
BUN: 12 mg/dL (ref 6–23)
CO2: 28 mEq/L (ref 19–32)
Creat: 0.93 mg/dL (ref 0.50–1.35)
GFR, Est African American: >89 mL/min
GFR, Est Non African American: >89 mL/min
Glucose, Bld: 74 mg/dL (ref 70–99)
Sodium: 139 mEq/L (ref 135–145)
Total Bilirubin: 1.3 mg/dL — ABNORMAL HIGH (ref 0.3–1.2)
Total Protein: 8.8 g/dL — ABNORMAL HIGH (ref 6.0–8.3)

## 2013-02-04 LAB — CBC WITH DIFFERENTIAL/PLATELET
Basophils Absolute: 0 10*3/uL (ref 0.0–0.1)
Basophils Relative: 1 % (ref 0–1)
HCT: 45.4 % (ref 39.0–52.0)
Hemoglobin: 15.3 g/dL (ref 13.0–17.0)
Lymphocytes Relative: 40 % (ref 12–46)
MCHC: 33.7 g/dL (ref 30.0–36.0)
Monocytes Absolute: 1.2 10*3/uL — ABNORMAL HIGH (ref 0.1–1.0)
Monocytes Relative: 15 % — ABNORMAL HIGH (ref 3–12)
Neutro Abs: 3.4 10*3/uL (ref 1.7–7.7)
Neutrophils Relative %: 44 % (ref 43–77)
RBC: 4.94 MIL/uL (ref 4.22–5.81)
WBC: 7.7 10*3/uL (ref 4.0–10.5)

## 2013-02-18 ENCOUNTER — Telehealth: Payer: Self-pay | Admitting: *Deleted

## 2013-02-18 ENCOUNTER — Ambulatory Visit: Payer: Self-pay | Admitting: Infectious Disease

## 2013-02-18 NOTE — Telephone Encounter (Signed)
RN called patient's [facility] case manager Baird Lyons to make him aware of patient's no-show today.  Per Baird Lyons, both he and [facility]) were in contact with patient regarding his appointment this morning.  Pt apparently texted Mitch to let him know he would not be able to make today's appointment.  Via text back, Mitch encouraged patient to come, told him that he has had too many no-shows to be rescheduled, that he will have to be seen on walk-in basis only.  Pt did not reply.  Pt should be restricted to walk-in only. Andree Coss, RN

## 2013-03-21 ENCOUNTER — Emergency Department (HOSPITAL_COMMUNITY)
Admission: EM | Admit: 2013-03-21 | Discharge: 2013-03-21 | Disposition: A | Discharge status: Home / Self Care | Payer: Self-pay | Attending: Emergency Medicine | Admitting: Emergency Medicine

## 2013-03-21 ENCOUNTER — Encounter (HOSPITAL_COMMUNITY): Payer: Self-pay | Admitting: *Deleted

## 2013-03-21 DIAGNOSIS — Z79899 Other long term (current) drug therapy: Secondary | ICD-10-CM | POA: Insufficient documentation

## 2013-03-21 DIAGNOSIS — G510 Bell's palsy: Secondary | ICD-10-CM | POA: Insufficient documentation

## 2013-03-21 DIAGNOSIS — R29810 Facial weakness: Secondary | ICD-10-CM | POA: Insufficient documentation

## 2013-03-21 DIAGNOSIS — F17200 Nicotine dependence, unspecified, uncomplicated: Secondary | ICD-10-CM | POA: Insufficient documentation

## 2013-03-21 DIAGNOSIS — Z8619 Personal history of other infectious and parasitic diseases: Secondary | ICD-10-CM | POA: Insufficient documentation

## 2013-03-21 MED ORDER — PREDNISONE 20 MG PO TABS: 60.0000 mg | ORAL_TABLET | Freq: Every day | ORAL | Status: DC

## 2013-03-21 MED ORDER — ACYCLOVIR 200 MG PO CAPS
400.0000 mg | ORAL_CAPSULE | Freq: Once | ORAL | Status: AC
  Administered 2013-03-21: 400 mg via ORAL
  Filled 2013-03-21: qty 2

## 2013-03-21 MED ORDER — ACYCLOVIR 400 MG PO TABS: 400.0000 mg | ORAL_TABLET | Freq: Every day | ORAL | Status: DC

## 2013-03-21 MED ORDER — PREDNISONE 20 MG PO TABS
60.0000 mg | ORAL_TABLET | Freq: Once | ORAL | Status: AC
  Administered 2013-03-21: 60 mg via ORAL
  Filled 2013-03-21: qty 3

## 2013-03-21 NOTE — ED Provider Notes (Signed)
CSN: [id number]     Arrival date & time 03/21/13  1348 History     First MD Initiated Contact with Patient 03/21/13 1400     Chief Complaint  Patient presents with  . Weakness   (Consider location/radiation/quality/duration/timing/severity/associated sxs/prior Treatment) HPI Comments: Patient with history of HIV, last CD4 count one month ago of 780, presents with complaint of left-sided facial droop for the past 2 days. Patient also complains of irritation and watering of his left eye. No skin changes or rashes. No taste disturbance. Patient denies fever, nausea, vomiting. He denies numbness or tingling in his arms or his legs. He is not having trouble with moving his arms or his legs. No difficulty swallowing. Onset of symptoms gradual. Course is gradually worsening. Nothing makes symptoms better or worse.  Patient is a 37 y.o. male presenting with weakness. The history is provided by the patient.  Weakness Associated symptoms include weakness. Pertinent negatives include no abdominal pain, chest pain, coughing, fatigue, fever, headaches, myalgias, nausea, neck pain, numbness, rash, sore throat or vomiting.    Past Medical History  Diagnosis Date  . Gonorrhea    History reviewed. No pertinent past surgical history. Family History  Problem Relation Age of Onset  . Hypertension Father   . Cancer Maternal Grandfather    History  Substance Use Topics  . Smoking status: Current Every Day Smoker -- 0.30 packs/day for 1.5 years    Types: Cigars  . Smokeless tobacco: Never Used  . Alcohol Use: 150.0 oz/week    300 drink(s) per week     Comment: ciroc and grey goose     Review of Systems  Constitutional: Negative for fever and fatigue.  HENT: Negative for sore throat, rhinorrhea, neck pain and tinnitus.   Eyes: Negative for photophobia, pain, redness and visual disturbance.  Respiratory: Negative for cough and shortness of breath.   Cardiovascular: Negative for chest pain.   Gastrointestinal: Negative for nausea, vomiting, abdominal pain and diarrhea.  Genitourinary: Negative for dysuria.  Musculoskeletal: Negative for myalgias, back pain and gait problem.  Skin: Negative for rash and wound.  Neurological: Positive for weakness. Negative for dizziness, light-headedness, numbness and headaches.  Psychiatric/Behavioral: Negative for confusion and decreased concentration.    Allergies  Review of patient's allergies indicates no known allergies.  Home Medications   Current Outpatient Rx  Name  Route  Sig  Dispense  Refill  . Emtricitab-Rilpivir-Tenofovir 200-25-300 MG TABS   Oral   Take 1 tablet by mouth daily.   30 tablet   11   . Multiple Vitamins-Minerals (MULTIVITAMIN WITH MINERALS) tablet   Oral   Take 1 tablet by mouth daily.          BP 133/81  Pulse 67  Temp(Src) 98.7 F (37.1 C) (Oral)  Resp 18  SpO2 100% Physical Exam  Nursing note and vitals reviewed. Constitutional: He is oriented to person, place, and time. He appears well-developed and well-nourished.  HENT:  Head: Normocephalic and atraumatic.  Right Ear: Tympanic membrane, external ear and ear canal normal.  Left Ear: Tympanic membrane, external ear and ear canal normal.  Nose: Nose normal.  Mouth/Throat: Uvula is midline, oropharynx is clear and moist and mucous membranes are normal.  Eyes: Conjunctivae, EOM and lids are normal. Pupils are equal, round, and reactive to light.  Neck: Normal range of motion. Neck supple.  Cardiovascular: Normal rate and regular rhythm.   Pulmonary/Chest: Effort normal and breath sounds normal.  Abdominal: Soft. There is no tenderness.  Musculoskeletal: Normal range of motion.       Cervical back: He exhibits normal range of motion, no tenderness and no bony tenderness.  Neurological: He is alert and oriented to person, place, and time. He has normal reflexes. A cranial nerve deficit is present. No sensory deficit. He exhibits normal muscle  tone. He displays a negative Romberg sign. Coordination and gait normal. GCS eye subscore is 4. GCS verbal subscore is 5. GCS motor subscore is 6.  Left-sided facial droop, paralysis includes forehead. Normal range of motion of eyes. Normal sensation of face. Normal range of motion of tongue. Normal gait. Normal strength in extremities bilaterally.  Skin: Skin is warm and dry.  Psychiatric: He has a normal mood and affect.    ED Course   Procedures (including critical care time)  Labs Reviewed - No data to display No results found. 1. Bell's palsy     2:23 PM Patient seen and examined. Medications ordered.   Vital signs reviewed and are as follows: Filed Vitals:   03/21/13 1502  BP: 133/81  Pulse:   Temp:   Resp:   BP 133/81  Pulse 67  Temp(Src) 98.7 F (37.1 C) (Oral)  Resp 18  SpO2 100%  Patient counseled on course of Bell's palsy.   Patient urged to return with worsening symptoms or other concerns. Patient verbalized understanding and agrees with plan.    MDM  Patient with left-sided facial nerve palsy consistent with Bell's palsy. There are no other neurological deficits. No Ramsay Hunt syndrome. History of HIV with CD4 count of 780. Will treat with steroids and acyclovir. Do not suspect central nervous system etiology.  Renne Crigler, PA-C 03/21/13 1600

## 2013-03-21 NOTE — ED Notes (Signed)
Patient verbalized understanding of discharge instructions.  Encouraged to return/call 911 for any new or worsening sx with weakness in any other area of his body, headaches, difficulty swallowing

## 2013-03-21 NOTE — ED Notes (Signed)
Patient reports he noticed his left eye was watering on Tuesday,  He now has facial weakness on the left side,  No other weakness reported.  Patient denies headache, denies chest pain.  Patient denies any difficulty swallowing.  No fevers.

## 2013-03-22 NOTE — ED Provider Notes (Signed)
Medical screening examination/treatment/procedure(s) were performed by non-physician practitioner and as supervising physician I was immediately available for consultation/collaboration.  Martha K Linker, MD 03/22/13 1507 

## 2013-04-02 ENCOUNTER — Encounter: Payer: Self-pay | Admitting: Internal Medicine

## 2013-04-02 ENCOUNTER — Ambulatory Visit (INDEPENDENT_AMBULATORY_CARE_PROVIDER_SITE_OTHER): Payer: Self-pay | Admitting: Internal Medicine

## 2013-04-02 VITALS — BP 141/99 | HR 71 | Temp 98.3°F | Ht 73.0 in | Wt 202.0 lb

## 2013-04-02 DIAGNOSIS — B20 Human immunodeficiency virus [HIV] disease: Secondary | ICD-10-CM

## 2013-04-02 DIAGNOSIS — I10 Essential (primary) hypertension: Secondary | ICD-10-CM

## 2013-04-02 DIAGNOSIS — Z23 Encounter for immunization: Secondary | ICD-10-CM

## 2013-04-03 NOTE — Assessment & Plan Note (Signed)
He is doing well and no indication to change pending labs today.  He will be called with any concerns, otherwise will follow up with his primary provider in 2 months.

## 2013-04-03 NOTE — Progress Notes (Signed)
  Subjective:    Patient ID: Cody Barton, male    DOB: December 13, 1975, 37 y.o.   MRN: [id number]  HPI Here for follow up.  He was started earlier this year on complera after diagnosis.  He missed his follow up appointment with his primary provider and is here as a walk in.  He initially after starting missed some doses of the complera since he was unsure that he could take it with alcohol and therefore had skipped it 2-3 times.  After reassurance at his previous visit though, he has taken it daily with no missed doses. He takes it with the appropriate food and feels well and is pleased with the medicine.  He smokes and does not exercise.  No diarrhea, no weight loss.    Review of Systems  Constitutional: Negative for fever, activity change, appetite change, fatigue and unexpected weight change.  HENT: Negative for sore throat and trouble swallowing.   Eyes: Negative for visual disturbance.  Respiratory: Negative for cough and shortness of breath.   Cardiovascular: Negative for chest pain and leg swelling.  Gastrointestinal: Negative for nausea, abdominal pain and diarrhea.  Musculoskeletal: Negative for myalgias and arthralgias.  Skin: Negative for rash.  Neurological: Negative for dizziness and headaches.  Hematological: Negative for adenopathy.  Psychiatric/Behavioral: Negative for dysphoric mood.       Objective:   Physical Exam  Constitutional: He is oriented to person, place, and time. He appears well-developed and well-nourished. No distress.  HENT:  Mouth/Throat: Oropharynx is clear and moist. No oropharyngeal exudate.  Eyes: Right eye exhibits no discharge. Left eye exhibits no discharge. No scleral icterus.  Cardiovascular: Normal rate, regular rhythm and normal heart sounds.   No murmur heard. Pulmonary/Chest: Effort normal and breath sounds normal. No respiratory distress. He has no wheezes.  Lymphadenopathy:    He has no cervical adenopathy.  Neurological: He is alert  and oriented to person, place, and time.  Skin: Skin is warm and dry. No rash noted.  Psychiatric: He has a normal mood and affect. His behavior is normal.          Assessment & Plan:

## 2013-04-03 NOTE — Assessment & Plan Note (Signed)
Still elevated.  I discussed smoking cessation mainly and he is motivated to quit.  Will follow.

## 2013-05-20 ENCOUNTER — Emergency Department (HOSPITAL_BASED_OUTPATIENT_CLINIC_OR_DEPARTMENT_OTHER)
Admission: EM | Admit: 2013-05-20 | Discharge: 2013-05-20 | Disposition: A | Discharge status: Home / Self Care | Payer: Self-pay | Attending: Emergency Medicine | Admitting: Emergency Medicine

## 2013-05-20 ENCOUNTER — Other Ambulatory Visit: Payer: Self-pay

## 2013-05-20 ENCOUNTER — Encounter (HOSPITAL_BASED_OUTPATIENT_CLINIC_OR_DEPARTMENT_OTHER): Payer: Self-pay

## 2013-05-20 DIAGNOSIS — F17200 Nicotine dependence, unspecified, uncomplicated: Secondary | ICD-10-CM | POA: Insufficient documentation

## 2013-05-20 DIAGNOSIS — Z21 Asymptomatic human immunodeficiency virus [HIV] infection status: Secondary | ICD-10-CM | POA: Insufficient documentation

## 2013-05-20 DIAGNOSIS — J029 Acute pharyngitis, unspecified: Secondary | ICD-10-CM | POA: Insufficient documentation

## 2013-05-20 DIAGNOSIS — H6690 Otitis media, unspecified, unspecified ear: Secondary | ICD-10-CM | POA: Insufficient documentation

## 2013-05-20 DIAGNOSIS — Z8619 Personal history of other infectious and parasitic diseases: Secondary | ICD-10-CM | POA: Insufficient documentation

## 2013-05-20 DIAGNOSIS — Z79899 Other long term (current) drug therapy: Secondary | ICD-10-CM | POA: Insufficient documentation

## 2013-05-20 HISTORY — DX: Asymptomatic human immunodeficiency virus (hiv) infection status: Z21

## 2013-05-20 HISTORY — DX: Human immunodeficiency virus (HIV) disease: B20

## 2013-05-20 MED ORDER — AMOXICILLIN-POT CLAVULANATE 875-125 MG PO TABS: 1.0000 | ORAL_TABLET | Freq: Two times a day (BID) | ORAL | Status: DC

## 2013-05-20 MED ORDER — AMOXICILLIN-POT CLAVULANATE 875-125 MG PO TABS
1.0000 | ORAL_TABLET | Freq: Once | ORAL | Status: AC
  Administered 2013-05-20: 1 via ORAL
  Filled 2013-05-20: qty 1

## 2013-05-20 MED ORDER — OXYMETAZOLINE HCL 0.05 % NA SOLN
1.0000 | Freq: Once | NASAL | Status: AC
  Administered 2013-05-20: 1 via NASAL
  Filled 2013-05-20: qty 15

## 2013-05-20 NOTE — ED Notes (Signed)
Pt called x2 with no response °

## 2013-05-20 NOTE — ED Provider Notes (Signed)
CSN: [id number]     Arrival date & time 05/20/13  4098 History  This chart was scribed for Hilario Quarry, MD by Quintella Reichert, ED scribe.  This patient was seen in room MH09/MH09 and the patient's care was started at 8:56 PM.   Chief Complaint  Patient presents with  . Otalgia    The history is provided by the patient. No language interpreter was used.    HPI Comments: Cody Barton is a 36 y.o. male with h/o HIV who presents to the Emergency Department complaining of progressively-worsening moderate left ear pain that began 4 days ago.  Pt states he woke up 4-5 days ago with sore throat and nasal congestion and gradually developed pain to his left ear.  His sore throat has gradually resolved. He denies fever.  Pt medicates regularly with antiretrovirals. He states he goes to a HIV clinic and his counts have been "average" recently.   Past Medical History  Diagnosis Date  . Gonorrhea   . HIV (human immunodeficiency virus infection)     History reviewed. No pertinent past surgical history.   Family History  Problem Relation Age of Onset  . Hypertension Father   . Cancer Maternal Grandfather     History  Substance Use Topics  . Smoking status: Current Every Day Smoker -- 0.30 packs/day for 1.5 years    Types: Cigars  . Smokeless tobacco: Never Used  . Alcohol Use: 0.0 oz/week     Comment:    weekend     Review of Systems  Constitutional: Negative for fever.  HENT: Positive for ear pain, congestion and sore throat.   All other systems reviewed and are negative.     Allergies  Review of patient's allergies indicates no known allergies.  Home Medications   Current Outpatient Rx  Name  Route  Sig  Dispense  Refill  . Emtricitab-Rilpivir-Tenofovir 200-25-300 MG TABS   Oral   Take 1 tablet by mouth daily.   30 tablet   11    BP 139/105  Pulse 86  Temp(Src) 98.2 F (36.8 C) (Oral)  Resp 16  Ht 6\' 1"  (1.854 m)  Wt 190 lb (86.183 kg)  BMI 25.07 kg/m2   SpO2 97%  Physical Exam  Nursing note and vitals reviewed. Constitutional: He is oriented to person, place, and time. He appears well-developed and well-nourished.  HENT:  Head: Normocephalic and atraumatic.  Right Ear: External ear normal.  Left Ear: External ear normal. Tympanic membrane is bulging.  Nose: Nose normal.  Mouth/Throat: Oropharynx is clear and moist.  Bulging left TM  Eyes: Conjunctivae and EOM are normal. Pupils are equal, round, and reactive to light.  Neck: Normal range of motion. Neck supple.  Cardiovascular: Normal rate, regular rhythm, normal heart sounds and intact distal pulses.   Pulmonary/Chest: Effort normal and breath sounds normal. No respiratory distress. He has no wheezes. He exhibits no tenderness.  Abdominal: Soft. Bowel sounds are normal. He exhibits no distension and no mass. There is no tenderness. There is no guarding.  Musculoskeletal: Normal range of motion.  Neurological: He is alert and oriented to person, place, and time. He has normal reflexes. He exhibits normal muscle tone. Coordination normal.  Skin: Skin is warm and dry.  Psychiatric: He has a normal mood and affect. His behavior is normal. Judgment and thought content normal.    ED Course  Procedures (including critical care time)  DIAGNOSTIC STUDIES: Oxygen Saturation is 97% on room air, normal by my  interpretation.    COORDINATION OF CARE: 9:02 PM-Discussed treatment plan which includes antibiotics with pt at bedside and pt agreed to plan.    Labs Review Labs Reviewed - No data to display  Imaging Review No results found.  MDM  No diagnosis found. I personally performed the services described in this documentation, which was scribed in my presence. The recorded information has been reviewed and considered.   Hilario Quarry, MD 05/20/13 [phone]

## 2013-05-20 NOTE — ED Notes (Signed)
MD at bedside. 

## 2013-05-20 NOTE — ED Notes (Signed)
Pt reports left ear pain since Thursday.  

## 2013-06-03 ENCOUNTER — Ambulatory Visit: Payer: Self-pay | Admitting: Infectious Disease

## 2013-06-10 ENCOUNTER — Ambulatory Visit (INDEPENDENT_AMBULATORY_CARE_PROVIDER_SITE_OTHER): Payer: Self-pay | Admitting: Infectious Disease

## 2013-06-10 ENCOUNTER — Encounter: Payer: Self-pay | Admitting: Infectious Disease

## 2013-06-10 VITALS — BP 148/101 | HR 68 | Temp 98.0°F | Ht 73.0 in | Wt 208.0 lb

## 2013-06-10 DIAGNOSIS — Z113 Encounter for screening for infections with a predominantly sexual mode of transmission: Secondary | ICD-10-CM

## 2013-06-10 DIAGNOSIS — F17200 Nicotine dependence, unspecified, uncomplicated: Secondary | ICD-10-CM

## 2013-06-10 DIAGNOSIS — Z23 Encounter for immunization: Secondary | ICD-10-CM

## 2013-06-10 DIAGNOSIS — Z79899 Other long term (current) drug therapy: Secondary | ICD-10-CM

## 2013-06-10 DIAGNOSIS — I10 Essential (primary) hypertension: Secondary | ICD-10-CM

## 2013-06-10 DIAGNOSIS — B20 Human immunodeficiency virus [HIV] disease: Secondary | ICD-10-CM

## 2013-06-10 LAB — COMPLETE METABOLIC PANEL WITH GFR
AST: 20 U/L (ref 0–37)
BUN: 13 mg/dL (ref 6–23)
CO2: 26 mEq/L (ref 19–32)
Calcium: 9.8 mg/dL (ref 8.4–10.5)
Chloride: 105 mEq/L (ref 96–112)
Creat: 0.97 mg/dL (ref 0.50–1.35)
GFR, Est African American: >89 mL/min

## 2013-06-10 LAB — CBC WITH DIFFERENTIAL/PLATELET
Lymphocytes Relative: 35 % (ref 12–46)
Lymphs Abs: 1.5 10*3/uL (ref 0.7–4.0)
Neutro Abs: 2.2 10*3/uL (ref 1.7–7.7)
Neutrophils Relative %: 52 % (ref 43–77)
Platelets: 310 10*3/uL (ref 150–400)
RBC: 4.66 MIL/uL (ref 4.22–5.81)
WBC: 4.3 10*3/uL (ref 4.0–10.5)

## 2013-06-10 LAB — LIPID PANEL
HDL: 45 mg/dL (ref >39)
LDL Cholesterol: 74 mg/dL (ref 0–99)
Total CHOL/HDL Ratio: 3 Ratio
VLDL: 18 mg/dL (ref 0–40)

## 2013-06-10 MED ORDER — AMLODIPINE BESYLATE 5 MG PO TABS: 5.0000 mg | ORAL_TABLET | Freq: Every day | ORAL | Status: DC

## 2013-06-10 NOTE — Progress Notes (Signed)
  Subjective:    Patient ID: Cody Barton, male    DOB: [birth year], 37 y.o.   MRN: [id number]  HPI   37year-old Afro-American recently diagnosed patient with HIV infection acquired infection in whom we had started Complera and who has had perfect virological suppression. He is due for labs for fall. Tolerating Complera without any problems and is taking it correctly with a 400 calorie meal and a voiding proton pump inhibitors and antacids.   Review of Systems  Constitutional: Negative for fever, chills, diaphoresis, activity change, appetite change, fatigue and unexpected weight change.  HENT: Negative for congestion, rhinorrhea, sinus pressure, sneezing, sore throat and trouble swallowing.   Eyes: Negative for photophobia and visual disturbance.  Respiratory: Negative for cough, chest tightness, shortness of breath, wheezing and stridor.   Cardiovascular: Negative for chest pain, palpitations and leg swelling.  Gastrointestinal: Negative for nausea, vomiting, abdominal pain, diarrhea, constipation, blood in stool, abdominal distention and anal bleeding.  Genitourinary: Negative for dysuria, hematuria, flank pain and difficulty urinating.  Musculoskeletal: Positive for arthralgias. Negative for back pain, gait problem, joint swelling and myalgias.  Skin: Negative for color change, pallor, rash and wound.  Neurological: Negative for dizziness, tremors, weakness and light-headedness.  Hematological: Negative for adenopathy. Does not bruise/bleed easily.  Psychiatric/Behavioral: Negative for behavioral problems, confusion, sleep disturbance, dysphoric mood, decreased concentration and agitation.       Objective:   Physical Exam  Constitutional: He is oriented to person, place, and time. He appears well-nourished. No distress.  HENT:  Head: Normocephalic and atraumatic.  Mouth/Throat: Oropharynx is clear and moist. No oropharyngeal exudate.  Eyes: Conjunctivae and EOM are normal. No  scleral icterus.  Neck: Normal range of motion. Neck supple.  Cardiovascular: Normal rate, regular rhythm and normal heart sounds.  Exam reveals no gallop and no friction rub.   No murmur heard. Pulmonary/Chest: Effort normal and breath sounds normal. No respiratory distress. He has no wheezes. He has no rales. He exhibits no tenderness.  Abdominal: He exhibits no distension and no mass. There is no tenderness. There is no rebound and no guarding.  Musculoskeletal: He exhibits no edema and no tenderness.       Right shoulder: He exhibits normal range of motion, no tenderness and no bony tenderness.  Neurological: He is alert and oriented to person, place, and time. He exhibits normal muscle tone. Coordination normal.  Skin: Skin is warm and dry. He is not diaphoretic.  Psychiatric: He has a normal mood and affect. His behavior is normal. Judgment and thought content normal.          Assessment & Plan:  HIV: check labs and continue  Complera rtc3 months  HTN: restart norvasc  HCM: hep b vax, check titers, flu shot  Smoker: counselled to quit

## 2013-06-11 LAB — T-HELPER CELL (CD4) - (RCID CLINIC ONLY)
CD4 % Helper T Cell: 35 % (ref 33–55)
CD4 T Cell Abs: 570 /uL (ref 400–2700)

## 2013-06-11 LAB — MICROALBUMIN / CREATININE URINE RATIO
Creatinine, Urine: 212.1 mg/dL
Microalb Creat Ratio: 6.3 mg/g (ref 0.0–30.0)
Microalb, Ur: 1.34 mg/dL (ref 0.00–1.89)

## 2013-06-11 LAB — HIV-1 RNA ULTRAQUANT REFLEX TO GENTYP+: HIV 1 RNA Quant: <20 copies/mL (ref <20)

## 2013-06-18 LAB — HLA B*5701

## 2013-06-18 LAB — VITAMIN D PNL(25-HYDRXY+1,25-DIHY)-BLD
Vitamin D 1, 25 (OH)2 Total: 61 pg/mL (ref 18–72)
Vitamin D3 1, 25 (OH)2: 61 pg/mL

## 2013-10-08 ENCOUNTER — Other Ambulatory Visit: Payer: Self-pay

## 2013-10-10 ENCOUNTER — Other Ambulatory Visit: Payer: Self-pay

## 2013-10-10 DIAGNOSIS — B20 Human immunodeficiency virus [HIV] disease: Secondary | ICD-10-CM

## 2013-10-10 LAB — CBC WITH DIFFERENTIAL/PLATELET
BASOS ABS: 0 10*3/uL (ref 0.0–0.1)
BASOS PCT: 0 % (ref 0–1)
EOS ABS: 0.2 10*3/uL (ref 0.0–0.7)
Eosinophils Relative: 3 % (ref 0–5)
HCT: 43.2 % (ref 39.0–52.0)
Hemoglobin: 14.4 g/dL (ref 13.0–17.0)
Lymphocytes Relative: 34 % (ref 12–46)
Lymphs Abs: 2.2 10*3/uL (ref 0.7–4.0)
MCH: 30.8 pg (ref 26.0–34.0)
MCHC: 33.3 g/dL (ref 30.0–36.0)
MCV: 92.3 fL (ref 78.0–100.0)
Monocytes Absolute: 0.5 10*3/uL (ref 0.1–1.0)
Monocytes Relative: 8 % (ref 3–12)
NEUTROS PCT: 55 % (ref 43–77)
Neutro Abs: 3.5 10*3/uL (ref 1.7–7.7)
Platelets: 368 10*3/uL (ref 150–400)
RBC: 4.68 MIL/uL (ref 4.22–5.81)
RDW: 12.4 % (ref 11.5–15.5)
WBC: 6.4 10*3/uL (ref 4.0–10.5)

## 2013-10-10 LAB — COMPLETE METABOLIC PANEL WITH GFR
ALT: 13 U/L (ref 0–53)
AST: 14 U/L (ref 0–37)
Albumin: 4.1 g/dL (ref 3.5–5.2)
Alkaline Phosphatase: 90 U/L (ref 39–117)
BUN: 12 mg/dL (ref 6–23)
CO2: 29 meq/L (ref 19–32)
Calcium: 9.7 mg/dL (ref 8.4–10.5)
Chloride: 102 mEq/L (ref 96–112)
Creat: 0.97 mg/dL (ref 0.50–1.35)
GFR, Est African American: >89 mL/min
GFR, Est Non African American: >89 mL/min
GLUCOSE: 104 mg/dL — AB (ref 70–99)
Potassium: 4.3 mEq/L (ref 3.5–5.3)
Sodium: 138 mEq/L (ref 135–145)
Total Bilirubin: 0.9 mg/dL (ref 0.2–1.2)
Total Protein: 7.7 g/dL (ref 6.0–8.3)

## 2013-10-11 LAB — T-HELPER CELL (CD4) - (RCID CLINIC ONLY)
CD4 % Helper T Cell: 33 % (ref 33–55)
CD4 T CELL ABS: 660 /uL (ref 400–2700)

## 2013-10-12 LAB — HIV-1 RNA QUANT-NO REFLEX-BLD: HIV 1 RNA Quant: <20 copies/mL (ref <20)

## 2013-10-23 ENCOUNTER — Ambulatory Visit: Payer: Self-pay | Admitting: Infectious Disease

## 2013-10-25 ENCOUNTER — Encounter: Payer: Self-pay | Admitting: *Deleted

## 2013-10-25 ENCOUNTER — Other Ambulatory Visit: Payer: Self-pay | Admitting: *Deleted

## 2013-10-25 DIAGNOSIS — B20 Human immunodeficiency virus [HIV] disease: Secondary | ICD-10-CM

## 2013-10-25 MED ORDER — EMTRICITAB-RILPIVIR-TENOFOV DF 200-25-300 MG PO TABS: 1.0000 | ORAL_TABLET | Freq: Every day | ORAL | Status: DC

## 2013-10-25 NOTE — Progress Notes (Signed)
ADAP application 

## 2013-11-23 ENCOUNTER — Emergency Department (HOSPITAL_COMMUNITY)
Admission: EM | Admit: 2013-11-23 | Discharge: 2013-11-23 | Disposition: A | Discharge status: Home / Self Care | Payer: Self-pay | Attending: Emergency Medicine | Admitting: Emergency Medicine

## 2013-11-23 ENCOUNTER — Emergency Department (HOSPITAL_COMMUNITY): Payer: Self-pay

## 2013-11-23 ENCOUNTER — Encounter (HOSPITAL_COMMUNITY): Payer: Self-pay | Admitting: Emergency Medicine

## 2013-11-23 DIAGNOSIS — F17200 Nicotine dependence, unspecified, uncomplicated: Secondary | ICD-10-CM | POA: Insufficient documentation

## 2013-11-23 DIAGNOSIS — M549 Dorsalgia, unspecified: Secondary | ICD-10-CM | POA: Insufficient documentation

## 2013-11-23 DIAGNOSIS — Z8619 Personal history of other infectious and parasitic diseases: Secondary | ICD-10-CM | POA: Insufficient documentation

## 2013-11-23 DIAGNOSIS — Z79899 Other long term (current) drug therapy: Secondary | ICD-10-CM | POA: Insufficient documentation

## 2013-11-23 DIAGNOSIS — Z21 Asymptomatic human immunodeficiency virus [HIV] infection status: Secondary | ICD-10-CM | POA: Insufficient documentation

## 2013-11-23 MED ORDER — HYDROCODONE-ACETAMINOPHEN 5-325 MG PO TABS: 1.0000 | ORAL_TABLET | ORAL | Status: DC | PRN

## 2013-11-23 MED ORDER — IBUPROFEN 800 MG PO TABS: 800.0000 mg | ORAL_TABLET | Freq: Three times a day (TID) | ORAL | Status: DC

## 2013-11-23 NOTE — ED Notes (Signed)
Pt c/o back pain onset Tuesday. Pt has tried ibuprofen without relief. Pain does not radiate. Pt ambulatory in triage. Pt denies recent injury.

## 2013-11-23 NOTE — ED Provider Notes (Signed)
CSN: [id number]     Arrival date & time 11/23/13  1728 History  This chart was scribed for non-physician practitioner Elpidio AnisShari Mariah Gerstenberger, PA-C working with Shanna CiscoMegan E Docherty, MD by Dorothey Basemania Sutton, ED Scribe. This patient was seen in room TR09C/TR09C and the patient's care was started at 5:58 PM.    Chief Complaint  Patient presents with  . Back Pain    The history is provided by the patient. No language interpreter was used.   HPI Comments: Cody Barton is a 38 y.o. male who presents to the Emergency Department complaining of an intermittent, non-radiating pain to the left side of the mid-back onset 5 days ago. He denies any potential injury or trauma to the area or recent heavy lifting. He states that this type of pain is new for him. Patient reports that the pain is exacerbated with laying down, but denies exacerbation with deep breathing. He reports taking ibuprofen at home with a moderate amount of temporary relief. He denies shortness of breath, cough, chest pain, abdominal pain, nausea, emesis. Patient has a history of HIV.   Past Medical History  Diagnosis Date  . Gonorrhea   . HIV (human immunodeficiency virus infection)    History reviewed. No pertinent past surgical history. Family History  Problem Relation Age of Onset  . Hypertension Father   . Cancer Maternal Grandfather    History  Substance Use Topics  . Smoking status: Current Every Day Smoker -- 0.30 packs/day for 1.5 years    Types: Cigarettes  . Smokeless tobacco: Never Used     Comment:  4-6 day  . Alcohol Use: 0.0 oz/week     Comment:    weekend    Review of Systems  Respiratory: Negative for cough and shortness of breath.   Cardiovascular: Negative for chest pain.  Gastrointestinal: Negative for nausea, vomiting and abdominal pain.  Musculoskeletal: Positive for back pain.  All other systems reviewed and are negative.   Allergies  Review of patient's allergies indicates no known allergies.  Home  Medications   Current Outpatient Rx  Name  Route  Sig  Dispense  Refill  . amLODipine (NORVASC) 5 MG tablet   Oral   Take 1 tablet (5 mg total) by mouth daily.   30 tablet   11   . Emtricitab-Rilpivir-Tenofovir 200-25-300 MG TABS   Oral   Take 1 tablet by mouth daily.   30 tablet   11    Triage Vitals: BP 128/70  Pulse 76  Temp(Src) 98.1 F (36.7 C) (Oral)  Resp 22  Ht 6\' 1"  (1.854 m)  Wt 211 lb (95.709 kg)  BMI 27.84 kg/m2  SpO2 97%  Physical Exam  Nursing note and vitals reviewed. Constitutional: He is oriented to person, place, and time. He appears well-developed and well-nourished. No distress.  HENT:  Head: Normocephalic and atraumatic.  Eyes: Conjunctivae are normal.  Neck: Normal range of motion. Neck supple.  Cardiovascular: Normal rate, regular rhythm and normal heart sounds.   Pulmonary/Chest: Effort normal and breath sounds normal. No respiratory distress.  Abdominal: He exhibits no distension.  Genitourinary:  No CVA tenderness.   Musculoskeletal: Normal range of motion.  No reproducible pain to the midline or paraspinal muscles along the entire length of the spine without swelling or discoloration.   Neurological: He is alert and oriented to person, place, and time.  Skin: Skin is warm and dry.  Psychiatric: He has a normal mood and affect. His behavior is normal.  ED Course  Procedures (including critical care time)  DIAGNOSTIC STUDIES: Oxygen Saturation is 97% on room air, normal by my interpretation.    COORDINATION OF CARE: 6:03 PM- Discussed that concern for pneumonia is very low, but will order a chest x-ray based on patient's medical history to rule out. Discussed treatment plan with patient at bedside and patient verbalized agreement.     Labs Review Labs Reviewed - No data to display  Imaging Review Dg Chest 2 View  11/23/2013   CLINICAL DATA:  Back pain, chest pain  EXAM: CHEST  2 VIEW  COMPARISON:  None.  FINDINGS:  Cardiomediastinal silhouette is unremarkable. No acute infiltrate or pleural effusion. No pulmonary edema. Bony thorax is unremarkable.  IMPRESSION: No active cardiopulmonary disease.   Electronically Signed   By: Natasha Mead M.D.   On: 11/23/2013 18:30     EKG Interpretation None      MDM   Final diagnoses:  None    1. Muscular back pain  CXR done secondary to history of HIV and location of back pain. He appears well. CXR negative. Will treat as muscular pain.  I personally performed the services described in this documentation, which was scribed in my presence. The recorded information has been reviewed and is accurate.      Arnoldo Hooker, PA-C 11/23/13 2037

## 2013-11-23 NOTE — Discharge Instructions (Signed)
Back Pain, Adult Low back pain is very common. About 1 in 5 people have back pain.The cause of low back pain is rarely dangerous. The pain often gets better over time.About half of people with a sudden onset of back pain feel better in just 2 weeks. About 8 in 10 people feel better by 6 weeks.  CAUSES Some common causes of back pain include:  Strain of the muscles or ligaments supporting the spine.  Wear and tear (degeneration) of the spinal discs.  Arthritis.  Direct injury to the back. DIAGNOSIS Most of the time, the direct cause of low back pain is not known.However, back pain can be treated effectively even when the exact cause of the pain is unknown.Answering your caregiver's questions about your overall health and symptoms is one of the most accurate ways to make sure the cause of your pain is not dangerous. If your caregiver needs more information, he or she may order lab work or imaging tests (X-rays or MRIs).However, even if imaging tests show changes in your back, this usually does not require surgery. HOME CARE INSTRUCTIONS For many people, back pain returns.Since low back pain is rarely dangerous, it is often a condition that people can learn to manageon their own.   Remain active. It is stressful on the back to sit or stand in one place. Do not sit, drive, or stand in one place for more than 30 minutes at a time. Take short walks on level surfaces as soon as pain allows.Try to increase the length of time you walk each day.  Do not stay in bed.Resting more than 1 or 2 days can delay your recovery.  Do not avoid exercise or work.Your body is made to move.It is not dangerous to be active, even though your back may hurt.Your back will likely heal faster if you return to being active before your pain is gone.  Pay attention to your body when you bend and lift. Many people have less discomfortwhen lifting if they bend their knees, keep the load close to their bodies,and  avoid twisting. Often, the most comfortable positions are those that put less stress on your recovering back.  Find a comfortable position to sleep. Use a firm mattress and lie on your side with your knees slightly bent. If you lie on your back, put a pillow under your knees.  Only take over-the-counter or prescription medicines as directed by your caregiver. Over-the-counter medicines to reduce pain and inflammation are often the most helpful.Your caregiver may prescribe muscle relaxant drugs.These medicines help dull your pain so you can more quickly return to your normal activities and healthy exercise.  Put ice on the injured area.  Put ice in a plastic bag.  Place a towel between your skin and the bag.  Leave the ice on for 15-20 minutes, 03-04 times a day for the first 2 to 3 days. After that, ice and heat may be alternated to reduce pain and spasms.  Ask your caregiver about trying back exercises and gentle massage. This may be of some benefit.  Avoid feeling anxious or stressed.Stress increases muscle tension and can worsen back pain.It is important to recognize when you are anxious or stressed and learn ways to manage it.Exercise is a great option. SEEK MEDICAL CARE IF:  You have pain that is not relieved with rest or medicine.  You have pain that does not improve in 1 week.  You have new symptoms.  You are generally not feeling well. SEEK   IMMEDIATE MEDICAL CARE IF:   You have pain that radiates from your back into your legs.  You develop new bowel or bladder control problems.  You have unusual weakness or numbness in your arms or legs.  You develop nausea or vomiting.  You develop abdominal pain.  You feel faint. Document Released: 08/08/2005 Document Revised: 02/07/2012 Document Reviewed: 12/27/2010 [employer] Patient Information 2014 [employer].  

## 2013-11-23 NOTE — ED Notes (Signed)
Discharge inst given  Voiced unsderstanding

## 2013-11-23 NOTE — ED Notes (Signed)
Back from xray

## 2013-11-24 NOTE — ED Provider Notes (Signed)
Medical screening examination/treatment/procedure(s) were performed by non-physician practitioner and as supervising physician I was immediately available for consultation/collaboration.   Megan E Docherty, MD 11/24/13 1037 

## 2013-12-13 ENCOUNTER — Other Ambulatory Visit: Payer: Self-pay | Admitting: *Deleted

## 2013-12-13 DIAGNOSIS — B20 Human immunodeficiency virus [HIV] disease: Secondary | ICD-10-CM

## 2013-12-13 MED ORDER — EMTRICITAB-RILPIVIR-TENOFOV DF 200-25-300 MG PO TABS: 1.0000 | ORAL_TABLET | Freq: Every day | ORAL | Status: DC

## 2013-12-17 ENCOUNTER — Other Ambulatory Visit: Payer: Self-pay | Admitting: Licensed Clinical Social Worker

## 2013-12-17 DIAGNOSIS — B20 Human immunodeficiency virus [HIV] disease: Secondary | ICD-10-CM

## 2013-12-17 MED ORDER — EMTRICITAB-RILPIVIR-TENOFOV DF 200-25-300 MG PO TABS: 1.0000 | ORAL_TABLET | Freq: Every day | ORAL | Status: DC

## 2014-04-26 ENCOUNTER — Encounter (HOSPITAL_COMMUNITY): Payer: Self-pay | Admitting: Emergency Medicine

## 2014-04-26 ENCOUNTER — Emergency Department (HOSPITAL_COMMUNITY)
Admission: EM | Admit: 2014-04-26 | Discharge: 2014-04-26 | Disposition: A | Discharge status: Home / Self Care | Payer: Self-pay | Attending: Emergency Medicine | Admitting: Emergency Medicine

## 2014-04-26 DIAGNOSIS — Z79899 Other long term (current) drug therapy: Secondary | ICD-10-CM | POA: Insufficient documentation

## 2014-04-26 DIAGNOSIS — Z21 Asymptomatic human immunodeficiency virus [HIV] infection status: Secondary | ICD-10-CM | POA: Insufficient documentation

## 2014-04-26 DIAGNOSIS — R51 Headache: Secondary | ICD-10-CM | POA: Insufficient documentation

## 2014-04-26 DIAGNOSIS — F17200 Nicotine dependence, unspecified, uncomplicated: Secondary | ICD-10-CM | POA: Insufficient documentation

## 2014-04-26 DIAGNOSIS — Z8619 Personal history of other infectious and parasitic diseases: Secondary | ICD-10-CM | POA: Insufficient documentation

## 2014-04-26 DIAGNOSIS — J0100 Acute maxillary sinusitis, unspecified: Secondary | ICD-10-CM | POA: Insufficient documentation

## 2014-04-26 MED ORDER — SULFAMETHOXAZOLE-TRIMETHOPRIM 800-160 MG PO TABS: 1.0000 | ORAL_TABLET | Freq: Two times a day (BID) | ORAL | Status: DC

## 2014-04-26 NOTE — ED Notes (Signed)
Pt having cold symptoms since Thursday, reports headache, ear pain, sore throat, runny nose. No distress noted at triage.

## 2014-04-26 NOTE — ED Provider Notes (Signed)
CSN: [id number]     Arrival date & time 04/26/14  1410 History  This chart was scribed for Elpidio Anis, PA, working with Arby Barrette, MD found by Elon Spanner, ED Scribe. This patient was seen in room TR06C/TR06C and the patient's care was started at 2:38 PM.    Chief Complaint  Patient presents with  . URI   Patient is a 38 y.o. male presenting with URI. The history is provided by the patient. No language interpreter was used.  URI Presenting symptoms: congestion, ear pain and rhinorrhea   Presenting symptoms: no cough     HPI Comments: Teofil Maniaci is a 38 y.o. male who presents to the Emergency Department complaining of headache, rhinorrhea, and right ear pain onset 2 days ago.  Patient also reports night-sweats and a subjective fever.  Patient reports using a nasal spray with no relief.   Patient reports he is prescribed anti-virals for HIV and is compliant.  Patient denies history of asthma.  Patient is a current smoker.  Patient denies cough.   PCP: Artist Pais Past Medical History  Diagnosis Date  . Gonorrhea   . HIV (human immunodeficiency virus infection)    History reviewed. No pertinent past surgical history. Family History  Problem Relation Age of Onset  . Hypertension Father   . Cancer Maternal Grandfather    History  Substance Use Topics  . Smoking status: Current Every Day Smoker -- 0.30 packs/day for 1.5 years    Types: Cigarettes  . Smokeless tobacco: Never Used     Comment:  4-6 day  . Alcohol Use: 0.0 oz/week     Comment:    weekend    Review of Systems  HENT: Positive for congestion, ear pain and rhinorrhea.   Respiratory: Negative for cough.       Allergies  Review of patient's allergies indicates no known allergies.  Home Medications   Prior to Admission medications   Medication Sig Start Date End Date Taking? Authorizing Provider  amLODipine (NORVASC) 5 MG tablet Take 1 tablet (5 mg total) by mouth daily. 06/10/13   Randall Hiss,  MD  Emtricitab-Rilpivir-Tenofovir 200-25-300 MG TABS Take 1 tablet by mouth daily. 12/17/13   Randall Hiss, MD  HYDROcodone-acetaminophen (NORCO/VICODIN) 5-325 MG per tablet Take 1-2 tablets by mouth every 4 (four) hours as needed. 11/23/13   Kaige Whistler A Audianna Landgren, PA-C  ibuprofen (ADVIL,MOTRIN) 800 MG tablet Take 800 mg by mouth every 8 (eight) hours as needed for moderate pain.    Historical Provider, MD  ibuprofen (ADVIL,MOTRIN) 800 MG tablet Take 1 tablet (800 mg total) by mouth 3 (three) times daily. 11/23/13   Yuritzy Zehring A Bethaney Oshana, PA-C  Multiple Vitamins-Minerals (MULTI-VITAMIN GUMMIES PO) Take 1 tablet by mouth daily.    Historical Provider, MD   BP 141/89  Pulse 99  Temp(Src) 98.3 F (36.8 C) (Oral)  Resp 18  Ht  (1.854 m)  Wt 211 lb (95.709 kg)  BMI 27.84 kg/m2  SpO2 93% Physical Exam  Nursing note and vitals reviewed. Constitutional: He is oriented to person, place, and time. He appears well-developed and well-nourished. No distress.  HENT:  Head: Normocephalic and atraumatic.  Right tm is erythematous, dull, and sightly bulging with loss of landmarks.  Left tm unremarkable.  Oropharynx is benign.  Nasal mucosa edema.  Cervical adenopathy.   Eyes: Conjunctivae and EOM are normal.  Neck: Neck supple. No tracheal deviation present.  Cardiovascular: Normal rate, regular rhythm and normal heart sounds.  No murmur heard. Pulmonary/Chest: Effort normal and breath sounds normal. No respiratory distress. He has no wheezes. He has no rales.  Musculoskeletal: Normal range of motion.  Neurological: He is alert and oriented to person, place, and time.  Skin: Skin is warm and dry.  Psychiatric: He has a normal mood and affect. His behavior is normal.    ED Course  Procedures (including critical care time)  DIAGNOSTIC STUDIES: Oxygen Saturation is 93% on RA, adequate by my interpretation.    COORDINATION OF CARE:  2:44 PM Will order imaging and prescribe antibiotics.  Patient  acknowledges and agrees with plan.    Labs Review Labs Reviewed - No data to display  Imaging Review No results found.   EKG Interpretation None      MDM   Final diagnoses:  None    1. Sinusitis  Suboptimal pulse ox noted, however, patient is not having any cough, SOB, wheezing. History of HIV - records reviewed and viral load low with last CD4 greater than 600. He is well appearing. Consideration is to treat with abx for sinus infection and encourage ID follow up for recheck neck week. No CXR warranted without respiratory symptoms, despite pulse ox 93%. Discussed with Dr. Donnald Garre. Patient's questions answered - stable for discharge.   I personally performed the services described in this documentation, which was scribed in my presence. The recorded information has been reviewed and is accurate.     Arnoldo Hooker, PA-C 04/27/14 [phone]

## 2014-04-26 NOTE — Discharge Instructions (Signed)

## 2014-04-29 NOTE — ED Provider Notes (Signed)
Medical screening examination/treatment/procedure(s) were performed by non-physician practitioner and as supervising physician I was immediately available for consultation/collaboration.   EKG Interpretation None       Arby Barrette, MD 04/29/14 1108

## 2014-06-18 ENCOUNTER — Other Ambulatory Visit: Payer: Self-pay

## 2014-07-07 ENCOUNTER — Ambulatory Visit: Payer: Self-pay | Admitting: Infectious Disease

## 2014-07-22 ENCOUNTER — Emergency Department (HOSPITAL_COMMUNITY)
Admission: EM | Admit: 2014-07-22 | Discharge: 2014-07-22 | Disposition: A | Discharge status: Home / Self Care | Payer: Self-pay | Attending: Emergency Medicine | Admitting: Emergency Medicine

## 2014-07-22 ENCOUNTER — Encounter (HOSPITAL_COMMUNITY): Payer: Self-pay | Admitting: *Deleted

## 2014-07-22 DIAGNOSIS — Z79899 Other long term (current) drug therapy: Secondary | ICD-10-CM | POA: Insufficient documentation

## 2014-07-22 DIAGNOSIS — Z720 Tobacco use: Secondary | ICD-10-CM | POA: Insufficient documentation

## 2014-07-22 DIAGNOSIS — Z791 Long term (current) use of non-steroidal anti-inflammatories (NSAID): Secondary | ICD-10-CM | POA: Insufficient documentation

## 2014-07-22 DIAGNOSIS — Z8619 Personal history of other infectious and parasitic diseases: Secondary | ICD-10-CM | POA: Insufficient documentation

## 2014-07-22 DIAGNOSIS — Z21 Asymptomatic human immunodeficiency virus [HIV] infection status: Secondary | ICD-10-CM | POA: Insufficient documentation

## 2014-07-22 DIAGNOSIS — N508 Other specified disorders of male genital organs: Secondary | ICD-10-CM | POA: Insufficient documentation

## 2014-07-22 DIAGNOSIS — N5089 Other specified disorders of the male genital organs: Secondary | ICD-10-CM

## 2014-07-22 NOTE — Discharge Instructions (Signed)
Please follow up with Dr. Daiva EvesVan Dam this week to discuss your test results this week. Please refrain from sexual intercourse until your tests have resulted. Please read all discharge instructions and return precautions.   Sexually Transmitted Disease A sexually transmitted disease (STD) is a disease or infection that may be passed (transmitted) from person to person, usually during sexual activity. This may happen by way of saliva, semen, blood, vaginal mucus, or urine. Common STDs include:   Gonorrhea.   Chlamydia.   Syphilis.   HIV and AIDS.   Genital herpes.   Hepatitis B and C.   Trichomonas.   Human papillomavirus (HPV).   Pubic lice.   Scabies.  Mites.  Bacterial vaginosis. WHAT ARE CAUSES OF STDs? An STD may be caused by bacteria, a virus, or parasites. STDs are often transmitted during sexual activity if one person is infected. However, they may also be transmitted through nonsexual means. STDs may be transmitted after:   Sexual intercourse with an infected person.   Sharing sex toys with an infected person.   Sharing needles with an infected person or using unclean piercing or tattoo needles.  Having intimate contact with the genitals, mouth, or rectal areas of an infected person.   Exposure to infected fluids during birth. WHAT ARE THE SIGNS AND SYMPTOMS OF STDs? Different STDs have different symptoms. Some people may not have any symptoms. If symptoms are present, they may include:   Painful or bloody urination.   Pain in the pelvis, abdomen, vagina, anus, throat, or eyes.   A skin rash, itching, or irritation.  Growths, ulcerations, blisters, or sores in the genital and anal areas.  Abnormal vaginal discharge with or without bad odor.   Penile discharge in men.   Fever.   Pain or bleeding during sexual intercourse.   Swollen glands in the groin area.   Yellow skin and eyes (jaundice). This is seen with hepatitis.   Swollen  testicles.  Infertility.  Sores and blisters in the mouth. HOW ARE STDs DIAGNOSED? To make a diagnosis, your health care provider may:   Take a medical history.   Perform a physical exam.   Take a sample of any discharge to examine.  Swab the throat, cervix, opening to the penis, rectum, or vagina for testing.  Test a sample of your first morning urine.   Perform blood tests.   Perform a Pap test, if this applies.   Perform a colposcopy.   Perform a laparoscopy.  HOW ARE STDs TREATED? Treatment depends on the STD. Some STDs may be treated but not cured.   Chlamydia, gonorrhea, trichomonas, and syphilis can be cured with antibiotic medicine.   Genital herpes, hepatitis, and HIV can be treated, but not cured, with prescribed medicines. The medicines lessen symptoms.   Genital warts from HPV can be treated with medicine or by freezing, burning (electrocautery), or surgery. Warts may come back.   HPV cannot be cured with medicine or surgery. However, abnormal areas may be removed from the cervix, vagina, or vulva.   If your diagnosis is confirmed, your recent sexual partners need treatment. This is true even if they are symptom-free or have a negative culture or evaluation. They should not have sex until their health care providers say it is okay. HOW CAN I REDUCE MY RISK OF GETTING AN STD? Take these steps to reduce your risk of getting an STD:  Use latex condoms, dental dams, and water-soluble lubricants during sexual activity. Do not use petroleum  jelly or oils.  Avoid having multiple sex partners.  Do not have sex with someone who has other sex partners.  Do not have sex with anyone you do not know or who is at high risk for an STD.  Avoid risky sex practices that can break your skin.  Do not have sex if you have open sores on your mouth or skin.  Avoid drinking too much alcohol or taking illegal drugs. Alcohol and drugs can affect your judgment and put  you in a vulnerable position.  Avoid engaging in oral and anal sex acts.  Get vaccinated for HPV and hepatitis. If you have not received these vaccines in the past, talk to your health care provider about whether one or both might be right for you.   If you are at risk of being infected with HIV, it is recommended that you take a prescription medicine daily to prevent HIV infection. This is called pre-exposure prophylaxis (PrEP). You are considered at risk if:  You are a man who has sex with other men (MSM).  You are a heterosexual man or woman and are sexually active with more than one partner.  You take drugs by injection.  You are sexually active with a partner who has HIV.  Talk with your health care provider about whether you are at high risk of being infected with HIV. If you choose to begin PrEP, you should first be tested for HIV. You should then be tested every 3 months for as long as you are taking PrEP.  WHAT SHOULD I DO IF I THINK I HAVE AN STD?  See your health care provider.   Tell your sexual partner(s). They should be tested and treated for any STDs.  Do not have sex until your health care provider says it is okay. WHEN SHOULD I GET IMMEDIATE MEDICAL CARE? Contact your health care provider right away if:   You have severe abdominal pain.  You are a man and notice swelling or pain in your testicles.  You are a woman and notice swelling or pain in your vagina. Document Released: 10/29/2002 Document Revised: 08/13/2013 Document Reviewed: 02/26/2013 [facility] Patient Information 2015 [place], [place]. This information is not intended to replace advice given to you by your health care provider. Make sure you discuss any questions you have with your health care provider.

## 2014-07-22 NOTE — ED Provider Notes (Signed)
CSN: [id number]     Arrival date & time 07/22/14  1555 History  This chart was scribed for non-physician practitioner, Francee PiccoloJennifer Jameek Bruntz, PA-C working with Gerhard Munchobert Lockwood, MD by Greggory StallionKayla Andersen, ED scribe. This patient was seen in room TR10C/TR10C and the patient's care was started at 4:22 PM.   Chief Complaint  Patient presents with  . Groin Swelling   The history is provided by the patient. No language interpreter was used.    HPI Comments: Cody Barton is a 38 y.o. male with history of HIV who presents to the Emergency Department complaining of raised areas to his left penis that started 5 days ago. States it has started to resolve and scab over. Denies itching. Denies drainage or bleeding from the area. Denies recent unprotected sex. Denies fever, chills, dysuria, penile pain, testicular pain, scrotal swelling, penile discharge, groin pain. Patient is followed by Dr. Daiva EvesVan Dam of ID. CD4 counts > 400.   Past Medical History  Diagnosis Date  . Gonorrhea   . HIV (human immunodeficiency virus infection)    History reviewed. No pertinent past surgical history. Family History  Problem Relation Age of Onset  . Hypertension Father   . Cancer Maternal Grandfather    History  Substance Use Topics  . Smoking status: Current Every Day Smoker -- 0.30 packs/day for 1.5 years    Types: Cigarettes  . Smokeless tobacco: Never Used     Comment:  4-6 day  . Alcohol Use: 0.0 oz/week     Comment:    weekend    Review of Systems  Constitutional: Negative for fever and chills.  Genitourinary: Negative for dysuria, discharge, penile swelling, scrotal swelling, penile pain and testicular pain.  Skin: Positive for rash.  All other systems reviewed and are negative.  Allergies  Review of patient's allergies indicates no known allergies.  Home Medications   Prior to Admission medications   Medication Sig Start Date End Date Taking? Authorizing Provider  amLODipine (NORVASC) 5 MG tablet  Take 1 tablet (5 mg total) by mouth daily. 06/10/13   Randall Hissornelius N Van Dam, MD  Emtricitab-Rilpivir-Tenofovir 200-25-300 MG TABS Take 1 tablet by mouth daily. 12/17/13   Randall Hissornelius N Van Dam, MD  HYDROcodone-acetaminophen (NORCO/VICODIN) 5-325 MG per tablet Take 1-2 tablets by mouth every 4 (four) hours as needed. 11/23/13   Shari A Upstill, PA-C  ibuprofen (ADVIL,MOTRIN) 800 MG tablet Take 800 mg by mouth every 8 (eight) hours as needed for moderate pain.    Historical Provider, MD  ibuprofen (ADVIL,MOTRIN) 800 MG tablet Take 1 tablet (800 mg total) by mouth 3 (three) times daily. 11/23/13   Shari A Upstill, PA-C  Multiple Vitamins-Minerals (MULTI-VITAMIN GUMMIES PO) Take 1 tablet by mouth daily.    Historical Provider, MD  sulfamethoxazole-trimethoprim (SEPTRA DS) 800-160 MG per tablet Take 1 tablet by mouth every 12 (twelve) hours. 04/26/14   Shari A Upstill, PA-C   BP 152/105 mmHg  Pulse 68  Temp(Src) 98.1 F (36.7 C) (Oral)  Resp 18  Ht 6\' 1"  (1.854 m)  SpO2 95%   Physical Exam  Constitutional: He is oriented to person, place, and time. He appears well-developed and well-nourished. No distress.  HENT:  Head: Normocephalic and atraumatic.  Right Ear: External ear normal.  Left Ear: External ear normal.  Nose: Nose normal.  Mouth/Throat: Oropharynx is clear and moist.  Eyes: Conjunctivae are normal.  Neck: Normal range of motion. Neck supple.  Cardiovascular: Normal rate, regular rhythm and normal heart sounds.  Exam  reveals no gallop and no friction rub.   No murmur heard. Pulmonary/Chest: Effort normal and breath sounds normal. No respiratory distress. He has no wheezes. He has no rhonchi. He has no rales.  Abdominal: Soft.  Genitourinary: Testes normal and penis normal. Right testis shows no mass, no swelling and no tenderness. Left testis shows no mass, no swelling and no tenderness. Circumcised. No penile erythema or penile tenderness. No discharge found.  No penile swelling. 0.5 cm non  raised, non tender pink lesion. No erythema, warmth. Not raised. Non-TTP. No induration or fluctuance. No erythema or warmth. No chancre appearance.  Musculoskeletal: Normal range of motion.  Lymphadenopathy:       Right: No inguinal adenopathy present.       Left: No inguinal adenopathy present.  Neurological: He is alert and oriented to person, place, and time.  Skin: Skin is warm and dry. He is not diaphoretic.  Psychiatric: He has a normal mood and affect.  Nursing note and vitals reviewed.   ED Course  Procedures (including critical care time)  DIAGNOSTIC STUDIES: Oxygen Saturation is 95% on RA, adequate by my interpretation.    COORDINATION OF CARE: 4:25 PM-Discussed treatment plan which includes UA and blood work with pt at bedside and pt agreed to plan.   Labs Review Labs Reviewed  GC/CHLAMYDIA PROBE AMP  RPR    Imaging Review No results found.   EKG Interpretation None      MDM   Final diagnoses:  Genital lesion, male    Filed Vitals:   07/22/14 1557  BP: 152/105  Pulse: 68  Temp: 98.1 F (36.7 C)  Resp: 18   Afebrile, NAD, non-toxic appearing, AAOx4.  Patient with small lesion on penis. Not raised. Non-TTP. No induration or fluctuance. No erythema or warmth. No chancre appearance. Will send GC/Chlmydia, RPR tests. Given no recent unprotected sexual intercourse and physical examination will defer on treatment. Advised follow up with Dr. Daiva EvesVan Dam this week to discuss test results and advised to withhold from sexual intercourse until tests result. Return precautions discussed. Patient is agreeable to plan.  Patient is stable at time of discharge Patient d/w with Dr. Jeraldine LootsLockwood, agrees with plan.    I personally performed the services described in this documentation, which was scribed in my presence. The recorded information has been reviewed and is accurate.  Jeannetta EllisJennifer L Radek Carnero, PA-C 07/22/14 1729  Gerhard Munchobert Lockwood, MD 07/23/14 [phone]

## 2014-07-22 NOTE — ED Notes (Addendum)
Spot, raised area on left lateral side of penis. Sexually active. Uses protection. No abnormal d/c.

## 2014-07-23 LAB — GC/CHLAMYDIA PROBE AMP
CT Probe RNA: NEGATIVE
GC Probe RNA: NEGATIVE

## 2014-07-23 LAB — RPR

## 2014-08-06 ENCOUNTER — Ambulatory Visit: Payer: Self-pay | Admitting: Infectious Disease

## 2014-08-13 ENCOUNTER — Ambulatory Visit: Payer: Self-pay | Admitting: Infectious Disease

## 2014-09-03 ENCOUNTER — Ambulatory Visit (INDEPENDENT_AMBULATORY_CARE_PROVIDER_SITE_OTHER): Payer: Self-pay | Admitting: Infectious Disease

## 2014-09-03 ENCOUNTER — Other Ambulatory Visit: Payer: Self-pay | Admitting: *Deleted

## 2014-09-03 ENCOUNTER — Encounter: Payer: Self-pay | Admitting: Infectious Disease

## 2014-09-03 VITALS — BP 126/80 | HR 67 | Temp 97.6°F | Wt 208.0 lb

## 2014-09-03 DIAGNOSIS — Z23 Encounter for immunization: Secondary | ICD-10-CM

## 2014-09-03 DIAGNOSIS — IMO0001 Reserved for inherently not codable concepts without codable children: Secondary | ICD-10-CM

## 2014-09-03 DIAGNOSIS — B20 Human immunodeficiency virus [HIV] disease: Secondary | ICD-10-CM

## 2014-09-03 DIAGNOSIS — R030 Elevated blood-pressure reading, without diagnosis of hypertension: Secondary | ICD-10-CM

## 2014-09-03 DIAGNOSIS — Z113 Encounter for screening for infections with a predominantly sexual mode of transmission: Secondary | ICD-10-CM

## 2014-09-03 LAB — CBC WITH DIFFERENTIAL/PLATELET
BASOS ABS: 0.1 10*3/uL (ref 0.0–0.1)
Basophils Relative: 1 % (ref 0–1)
Eosinophils Absolute: 0.1 10*3/uL (ref 0.0–0.7)
Eosinophils Relative: 2 % (ref 0–5)
HEMATOCRIT: 42.2 % (ref 39.0–52.0)
HEMOGLOBIN: 13.8 g/dL (ref 13.0–17.0)
LYMPHS ABS: 1.4 10*3/uL (ref 0.7–4.0)
Lymphocytes Relative: 24 % (ref 12–46)
MCH: 30.4 pg (ref 26.0–34.0)
MCHC: 32.7 g/dL (ref 30.0–36.0)
MCV: 93 fL (ref 78.0–100.0)
MONOS PCT: 11 % (ref 3–12)
MPV: 9.1 fL (ref 8.6–12.4)
Monocytes Absolute: 0.7 10*3/uL (ref 0.1–1.0)
NEUTROS ABS: 3.7 10*3/uL (ref 1.7–7.7)
Neutrophils Relative %: 62 % (ref 43–77)
Platelets: 346 10*3/uL (ref 150–400)
RBC: 4.54 MIL/uL (ref 4.22–5.81)
RDW: 12.5 % (ref 11.5–15.5)
WBC: 6 10*3/uL (ref 4.0–10.5)

## 2014-09-03 LAB — COMPLETE METABOLIC PANEL WITH GFR
ALT: 39 U/L (ref 0–53)
AST: 27 U/L (ref 0–37)
Albumin: 3.7 g/dL (ref 3.5–5.2)
Alkaline Phosphatase: 129 U/L — ABNORMAL HIGH (ref 39–117)
BILIRUBIN TOTAL: 0.7 mg/dL (ref 0.2–1.2)
BUN: 11 mg/dL (ref 6–23)
CO2: 24 mEq/L (ref 19–32)
CREATININE: 0.84 mg/dL (ref 0.50–1.35)
Calcium: 9.6 mg/dL (ref 8.4–10.5)
Chloride: 105 mEq/L (ref 96–112)
GFR, Est African American: >89 mL/min
GFR, Est Non African American: >89 mL/min
Glucose, Bld: 86 mg/dL (ref 70–99)
Potassium: 4.4 mEq/L (ref 3.5–5.3)
Sodium: 136 mEq/L (ref 135–145)
TOTAL PROTEIN: 8.2 g/dL (ref 6.0–8.3)

## 2014-09-03 MED ORDER — ABACAVIR-DOLUTEGRAVIR-LAMIVUD 600-50-300 MG PO TABS: 1.0000 | ORAL_TABLET | Freq: Every day | ORAL | Status: DC

## 2014-09-03 NOTE — Progress Notes (Signed)
  Subjective:    Patient ID: Cody MoccasinConstantine Musial, male    DOB: 04-24-1976, 39 y.o.   MRN: [id number]  HPI   [age]year-old Afro-American recently diagnosed patient with HIV infection acquired infection in whom we had started Complera and who has had perfect virological suppression. He admitted today to me that he sometimes take the complera on an empty stomach which I said was absolutely a bad idea. [facility] will [facility] provided he does not have new R mutations that will make this incomplete regimen   Review of Systems  Constitutional: Negative for fever, chills, diaphoresis, activity change, appetite change, fatigue and unexpected weight change.  HENT: Negative for congestion, rhinorrhea, sinus pressure, sneezing, sore throat and trouble swallowing.   Eyes: Negative for photophobia and visual disturbance.  Respiratory: Negative for cough, chest tightness, shortness of breath, wheezing and stridor.   Cardiovascular: Negative for chest pain, palpitations and leg swelling.  Gastrointestinal: Negative for nausea, vomiting, abdominal pain, diarrhea, constipation, blood in stool, abdominal distention and anal bleeding.  Genitourinary: Negative for dysuria, hematuria, flank pain and difficulty urinating.  Musculoskeletal: Positive for arthralgias. Negative for myalgias, back pain, joint swelling and gait problem.  Skin: Negative for color change, pallor, rash and wound.  Neurological: Negative for dizziness, tremors, weakness and light-headedness.  Hematological: Negative for adenopathy. Does not bruise/bleed easily.  Psychiatric/Behavioral: Negative for behavioral problems, confusion, sleep disturbance, dysphoric mood, decreased concentration and agitation.       Objective:   Physical Exam  Constitutional: He is oriented to person, place, and time. He appears well-nourished. No distress.  HENT:  Head: Normocephalic and atraumatic.  Mouth/Throat: Oropharynx is clear and moist. No  oropharyngeal exudate.  Eyes: Conjunctivae and EOM are normal. No scleral icterus.  Neck: Normal range of motion. Neck supple.  Cardiovascular: Normal rate, regular rhythm and normal heart sounds.  Exam reveals no gallop and no friction rub.   No murmur heard. Pulmonary/Chest: Effort normal and breath sounds normal. No respiratory distress. He has no wheezes. He has no rales. He exhibits no tenderness.  Abdominal: He exhibits no distension and no mass. There is no tenderness. There is no rebound and no guarding.  Musculoskeletal: He exhibits no edema or tenderness.       Right shoulder: He exhibits normal range of motion, no tenderness and no bony tenderness.  Neurological: He is alert and oriented to person, place, and time. He exhibits normal muscle tone. Coordination normal.  Skin: Skin is warm and dry. He is not diaphoretic.  Psychiatric: He has a normal mood and affect. His behavior is normal. Judgment and thought content normal.          Assessment & Plan:  HIV: check labs and switcht o TRIUMEQ and bring back in 1 month for labs and 6 weeks for appt,  Renew ADAP. I spent greater than 25 minutes with the patient including greater than 50% of time in face to face counsel of the patient and in coordination of their care.   HTN: DC norvasc. Normotensive off anti-HTNSives   Smoker: counselled to quit with greater than 3 minutes spent counselling pt.

## 2014-09-03 NOTE — Telephone Encounter (Signed)
ADAP Application 

## 2014-09-04 ENCOUNTER — Telehealth: Payer: Self-pay | Admitting: Licensed Clinical Social Worker

## 2014-09-04 ENCOUNTER — Ambulatory Visit (INDEPENDENT_AMBULATORY_CARE_PROVIDER_SITE_OTHER): Payer: Self-pay | Admitting: *Deleted

## 2014-09-04 DIAGNOSIS — A549 Gonococcal infection, unspecified: Secondary | ICD-10-CM

## 2014-09-04 DIAGNOSIS — A749 Chlamydial infection, unspecified: Secondary | ICD-10-CM

## 2014-09-04 LAB — T-HELPER CELL (CD4) - (RCID CLINIC ONLY)
CD4 % Helper T Cell: 36 % (ref 33–55)
CD4 T CELL ABS: 530 /uL (ref 400–2700)

## 2014-09-04 LAB — URINE CYTOLOGY ANCILLARY ONLY
Chlamydia: NEGATIVE
NEISSERIA GONORRHEA: POSITIVE — AB

## 2014-09-04 LAB — RPR

## 2014-09-04 MED ORDER — CEFTRIAXONE SODIUM 1 G IJ SOLR
250.0000 mg | Freq: Once | INTRAMUSCULAR | Status: AC
  Administered 2014-09-04: 250 mg via INTRAMUSCULAR

## 2014-09-04 MED ORDER — AZITHROMYCIN 250 MG PO TABS
1000.0000 mg | ORAL_TABLET | Freq: Once | ORAL | Status: AC
  Administered 2014-09-04: 1000 mg via ORAL

## 2014-09-04 NOTE — Telephone Encounter (Signed)
Error

## 2014-09-04 NOTE — Telephone Encounter (Signed)
-----   Message from Randall Hissornelius N Van Dam, MD sent at 09/04/2014  3:29 PM EST ----- Pt positive for GC needs rocephin IM  and azithromycin po dose, partners need to be tested, treated and he needs to use condoms

## 2014-09-04 NOTE — Telephone Encounter (Signed)
Left message with patient's mother to have him call the office. See note from Dr.  Daiva EvesVan Dam below

## 2014-09-04 NOTE — Telephone Encounter (Signed)
Per Dr Daiva EvesVan Dam give  Rocephin 250 mg injection, and 1 gram azithromycin once.

## 2014-09-04 NOTE — Patient Instructions (Signed)
Pt instructed to use condoms with every type of sexual encounter.  Pt to instruct partner to obtain testing and treatment.  Pt given 2 bags of condoms.

## 2014-09-05 LAB — HIV-1 RNA QUANT-NO REFLEX-BLD

## 2014-10-02 NOTE — Progress Notes (Signed)
Patient ID: Cody Barton, male   DOB: [birth year], 39 y.o.   MRN: [id number] Patient is not open for case management. Cm attempted to contact today for [employer] voucher. Cm spoke with mother and left message.

## 2014-10-06 ENCOUNTER — Other Ambulatory Visit: Payer: Self-pay

## 2014-10-20 ENCOUNTER — Ambulatory Visit: Payer: Self-pay | Admitting: Infectious Disease

## 2014-11-06 ENCOUNTER — Other Ambulatory Visit (INDEPENDENT_AMBULATORY_CARE_PROVIDER_SITE_OTHER): Payer: Self-pay

## 2014-11-06 DIAGNOSIS — IMO0001 Reserved for inherently not codable concepts without codable children: Secondary | ICD-10-CM

## 2014-11-06 DIAGNOSIS — B20 Human immunodeficiency virus [HIV] disease: Secondary | ICD-10-CM

## 2014-11-06 LAB — COMPLETE METABOLIC PANEL WITH GFR
ALT: 18 U/L (ref 0–53)
AST: 18 U/L (ref 0–37)
Albumin: 3.9 g/dL (ref 3.5–5.2)
Alkaline Phosphatase: 93 U/L (ref 39–117)
BUN: 12 mg/dL (ref 6–23)
CALCIUM: 9.7 mg/dL (ref 8.4–10.5)
CHLORIDE: 104 meq/L (ref 96–112)
CO2: 24 meq/L (ref 19–32)
CREATININE: 1.02 mg/dL (ref 0.50–1.35)
GFR, Est African American: >89 mL/min
GFR, Est Non African American: >89 mL/min
Glucose, Bld: 127 mg/dL — ABNORMAL HIGH (ref 70–99)
Potassium: 4.4 mEq/L (ref 3.5–5.3)
SODIUM: 137 meq/L (ref 135–145)
Total Bilirubin: 0.6 mg/dL (ref 0.2–1.2)
Total Protein: 7.8 g/dL (ref 6.0–8.3)

## 2014-11-06 LAB — CBC WITH DIFFERENTIAL/PLATELET
BASOS PCT: 1 % (ref 0–1)
Basophils Absolute: 0.1 10*3/uL (ref 0.0–0.1)
Eosinophils Absolute: 0.1 10*3/uL (ref 0.0–0.7)
Eosinophils Relative: 2 % (ref 0–5)
HEMATOCRIT: 42.9 % (ref 39.0–52.0)
Hemoglobin: 14.2 g/dL (ref 13.0–17.0)
LYMPHS ABS: 1.7 10*3/uL (ref 0.7–4.0)
Lymphocytes Relative: 27 % (ref 12–46)
MCH: 31.3 pg (ref 26.0–34.0)
MCHC: 33.1 g/dL (ref 30.0–36.0)
MCV: 94.5 fL (ref 78.0–100.0)
MONOS PCT: 8 % (ref 3–12)
MPV: 9.3 fL (ref 8.6–12.4)
Monocytes Absolute: 0.5 10*3/uL (ref 0.1–1.0)
NEUTROS ABS: 3.9 10*3/uL (ref 1.7–7.7)
Neutrophils Relative %: 62 % (ref 43–77)
Platelets: 316 10*3/uL (ref 150–400)
RBC: 4.54 MIL/uL (ref 4.22–5.81)
RDW: 12.5 % (ref 11.5–15.5)
WBC: 6.3 10*3/uL (ref 4.0–10.5)

## 2014-11-07 LAB — HIV-1 RNA QUANT-NO REFLEX-BLD
HIV 1 RNA Quant: <20 copies/mL (ref <20)
HIV-1 RNA Quant, Log: <1.3 {Log} (ref <1.30)

## 2014-11-07 LAB — T-HELPER CELL (CD4) - (RCID CLINIC ONLY)
CD4 % Helper T Cell: 37 % (ref 33–55)
CD4 T CELL ABS: 650 /uL (ref 400–2700)

## 2014-11-26 ENCOUNTER — Emergency Department (HOSPITAL_COMMUNITY)
Admission: EM | Admit: 2014-11-26 | Discharge: 2014-11-26 | Disposition: A | Discharge status: Home / Self Care | Payer: Self-pay | Attending: Emergency Medicine | Admitting: Emergency Medicine

## 2014-11-26 ENCOUNTER — Encounter (HOSPITAL_COMMUNITY): Payer: Self-pay | Admitting: *Deleted

## 2014-11-26 DIAGNOSIS — R220 Localized swelling, mass and lump, head: Secondary | ICD-10-CM | POA: Insufficient documentation

## 2014-11-26 DIAGNOSIS — J309 Allergic rhinitis, unspecified: Secondary | ICD-10-CM | POA: Insufficient documentation

## 2014-11-26 DIAGNOSIS — Z8619 Personal history of other infectious and parasitic diseases: Secondary | ICD-10-CM | POA: Insufficient documentation

## 2014-11-26 DIAGNOSIS — H9202 Otalgia, left ear: Secondary | ICD-10-CM | POA: Insufficient documentation

## 2014-11-26 DIAGNOSIS — Z720 Tobacco use: Secondary | ICD-10-CM | POA: Insufficient documentation

## 2014-11-26 DIAGNOSIS — Z8719 Personal history of other diseases of the digestive system: Secondary | ICD-10-CM | POA: Insufficient documentation

## 2014-11-26 DIAGNOSIS — M25552 Pain in left hip: Secondary | ICD-10-CM | POA: Insufficient documentation

## 2014-11-26 DIAGNOSIS — H6592 Unspecified nonsuppurative otitis media, left ear: Secondary | ICD-10-CM

## 2014-11-26 LAB — RAPID STREP SCREEN (MED CTR MEBANE ONLY): Streptococcus, Group A Screen (Direct): NEGATIVE

## 2014-11-26 MED ORDER — CETIRIZINE-PSEUDOEPHEDRINE ER 5-120 MG PO TB12: 1.0000 | ORAL_TABLET | Freq: Two times a day (BID) | ORAL | Status: DC

## 2014-11-26 MED ORDER — SALINE SPRAY 0.65 % NA SOLN: 1.0000 | NASAL | Status: DC | PRN

## 2014-11-26 NOTE — ED Notes (Signed)
Pt reports nasal congestion, left ear pain and left hip pain. Ambulatory on arrival.

## 2014-11-26 NOTE — ED Provider Notes (Signed)
CSN: [id number]     Arrival date & time 11/26/14  [id number] History   First MD Initiated Contact with Patient 11/26/14 [id number]     Chief Complaint  Patient presents with  . Otalgia  . Hip Pain     (Consider location/radiation/quality/duration/timing/severity/associated sxs/prior Treatment) HPI Cody Barton is a 39 year old male with past medical history of HIV who presents the ER complaining of left ear discomfort along with a scratchy throat. Patient states these symptoms have been occurring for approximately one week now. He describes the ear discomfort as an "echoing and muffled sensation" in his left ear. He also describes a left-sided "scratchy throat". Patient also reports associated nasal congestion and mild amount of clear discharge over the past week. Patient denies dysphagia, throat swelling, shortness of breath, fever him a headache, cough.  Patient also reports left-sided hip pain that has been going on for several weeks now. Patient states he does not have any specific injury to this hip, does not report any overuse of this hip. Patient states he does go to the gym on occasion, and he feels this is recently aggravated his hip. He states the hip pain is worse with range of motion, movement, heavy lifting.  Past Medical History  Diagnosis Date  . Gonorrhea   . HIV (human immunodeficiency virus infection)    History reviewed. No pertinent past surgical history. Family History  Problem Relation Age of Onset  . Hypertension Father   . Cancer Maternal Grandfather    History  Substance Use Topics  . Smoking status: Current Every Day Smoker -- 0.30 packs/day for 1.5 years    Types: Cigarettes  . Smokeless tobacco: Never Used     Comment:  4-6 day  . Alcohol Use: 0.0 oz/week    0 Standard drinks or equivalent per week     Comment:    weekend    Review of Systems  Constitutional: Negative for fever.  HENT: Positive for congestion, rhinorrhea and sore throat. Negative for  trouble swallowing and voice change.        Ear pressure, muffled hearing  Eyes: Negative for visual disturbance.  Respiratory: Negative for shortness of breath.   Cardiovascular: Negative for chest pain.  Gastrointestinal: Negative for nausea, vomiting and abdominal pain.  Genitourinary: Negative for dysuria.  Skin: Negative for rash.  Neurological: Negative for dizziness, syncope, weakness, numbness and headaches.  Psychiatric/Behavioral: Negative.       Allergies  Review of patient's allergies indicates no known allergies.  Home Medications   Prior to Admission medications   Medication Sig Start Date End Date Taking? Authorizing Provider  Abacavir-Dolutegravir-Lamivud (TRIUMEQ) 600-50-300 MG TABS Take 1 tablet by mouth daily. 09/03/14   Gardiner Barefootobert W Comer, MD  cetirizine-pseudoephedrine (ZYRTEC-D) 5-120 MG per tablet Take 1 tablet by mouth 2 (two) times daily. 11/26/14   Ladona MowJoe Wyona Neils, PA-C  Multiple Vitamins-Minerals (MULTI-VITAMIN GUMMIES PO) Take 1 tablet by mouth daily.    Historical Provider, MD  sodium chloride (OCEAN) 0.65 % SOLN nasal spray Place 1 spray into both nostrils as needed for congestion. 11/26/14   Ladona MowJoe Cashae Weich, PA-C   BP 137/89 mmHg  Pulse 63  Temp(Src) 98.3 F (36.8 C) (Oral)  Resp 18  SpO2 96% Physical Exam  Constitutional: He is oriented to person, place, and time. He appears well-developed and well-nourished. No distress.  HENT:  Head: Normocephalic and atraumatic.  Right Ear: Tympanic membrane normal.  Left Ear: External ear normal. No mastoid tenderness. Tympanic membrane is bulging. Tympanic membrane is  not injected and not erythematous. A middle ear effusion is present. No hemotympanum.  Nose: Rhinorrhea present. No nasal septal hematoma. No epistaxis.  Nasal turbinates mildly edematous bilaterally with a bluish gray color. Right is impaired membrane normal. Left tympanic membrane with serous effusion. No obvious erythema to tympanic membrane.  Eyes: Right eye  exhibits no discharge. Left eye exhibits no discharge. No scleral icterus.  Neck: Normal range of motion and full passive range of motion without pain. Neck supple. No spinous process tenderness and no muscular tenderness present. No rigidity. No edema, no erythema and normal range of motion present. No Brudzinski's sign and no Kernig's sign noted.  Pulmonary/Chest: Effort normal and breath sounds normal. No accessory muscle usage. No tachypnea. No respiratory distress.  Musculoskeletal: Normal range of motion.       Left hip: Normal. He exhibits normal range of motion, normal strength, no bony tenderness, no swelling and no deformity.       Legs: No obvious injury, erythema, edema, ecchymosis to hip. Pain is all anteriorly in flexor muscles. No trochanteric discomfort. Full active and passive range of motion of hip. Patient has 5 out of 5 motor strength at hip, knee, ankle. DP pulse 2+. Distal sensation intact. Negative logroll test. Patient has no discomfort with range of motion of his hip that I can elicit.  Neurological: He is alert and oriented to person, place, and time. He has normal strength. No cranial nerve deficit. Gait normal. GCS eye subscore is 4. GCS verbal subscore is 5. GCS motor subscore is 6.  Skin: Skin is warm and dry. He is not diaphoretic.  Psychiatric: He has a normal mood and affect.  Nursing note and vitals reviewed.   ED Course  Procedures (including critical care time) Labs Review Labs Reviewed  RAPID STREP SCREEN  CULTURE, GROUP A STREP    Imaging Review No results found.   EKG Interpretation None      MDM   Final diagnoses:  Allergic rhinitis, unspecified allergic rhinitis type  Otalgia of left ear  Middle ear effusion, left    L ear discomfort "echoing" and muffled sound with L sided throat "scratchy" and mild discomfort  With swallowing.  No fever.  Nasal congestion s discharge.  Nasal turbinates blue/grey.  Throat erythematous.  L ear with obvious  effusion and mild erythema.  Likely allergic rhinitis.  Will send rapid strep.  Also c/o hip pain.  No injury.  Pt declines XR.  Cannot ellicit pain on exam.    Patient's signs and symptoms consistent with an allergic rhinitis based on history and physical exam.rapid strep negative, no concern for PTA retropharyngeal abscess. No concern for otitis media. No signs of meningismus. We will encouraged patient to use Zyrtec with decongestant to help with his ear effusion and allergic rhinitis. Do not believe antibiotics will provide any benefit for patient signs and symptoms as there is no evidence of any bacterial infection or manifestations at this time.  Patient's hip pain is consistent with a musculoskeletal hip pain. I offered to x-ray patient's hip, however he states the pain is not present now, the pain is not unbearable and he declines x-rays at this time. I encouraged follow-up with a primary care provider for his hip pain, encouraged RICE therapy  Patient hemodynamically stable, afebrile, will be discharged at this time. Strongly encouraged to follow-up with a primary care provider and given resource guide to help find one. I discussed return precautions with patient, and patient verbalizes understanding  and agreement of this plan. I encouraged patient to call or return to ER with any worsening of symptoms or should he have any questions or concerns.  BP 137/89 mmHg  Pulse 63  Temp(Src) 98.3 F (36.8 C) (Oral)  Resp 18  SpO2 96%  Signed,  Ladona Mow, PA-C 5:22 PM   Ladona Mow, PA-C 11/26/14 1722  Donnetta Hutching, MD 11/27/14 0930

## 2014-11-26 NOTE — Discharge Instructions (Signed)
Allergic Rhinitis °Allergic rhinitis is when the mucous membranes in the nose respond to allergens. Allergens are particles in the air that cause your body to have an allergic reaction. This causes you to release allergic antibodies. Through a chain of events, these eventually cause you to release histamine into the blood stream. Although meant to protect the body, it is this release of histamine that causes your discomfort, such as frequent sneezing, congestion, and an itchy, runny nose.  °CAUSES  °Seasonal allergic rhinitis (hay fever) is caused by pollen allergens that may come from grasses, trees, and weeds. Year-round allergic rhinitis (perennial allergic rhinitis) is caused by allergens such as house dust mites, pet dander, and mold spores.  °SYMPTOMS  °· Nasal stuffiness (congestion). °· Itchy, runny nose with sneezing and tearing of the eyes. °DIAGNOSIS  °Your health care provider can help you determine the allergen or allergens that trigger your symptoms. If you and your health care provider are unable to determine the allergen, skin or blood testing may be used. °TREATMENT  °Allergic rhinitis does not have a cure, but it can be controlled by: °· Medicines and allergy shots (immunotherapy). °· Avoiding the allergen. °Hay fever may often be treated with antihistamines in pill or nasal spray forms. Antihistamines block the effects of histamine. There are over-the-counter medicines that may help with nasal congestion and swelling around the eyes. Check with your health care provider before taking or giving this medicine.  °If avoiding the allergen or the medicine prescribed do not work, there are many new medicines your health care provider can prescribe. Stronger medicine may be used if initial measures are ineffective. Desensitizing injections can be used if medicine and avoidance does not work. Desensitization is when a patient is given ongoing shots until the body becomes less sensitive to the allergen.  Make sure you follow up with your health care provider if problems continue. °HOME CARE INSTRUCTIONS °It is not possible to completely avoid allergens, but you can reduce your symptoms by taking steps to limit your exposure to them. It helps to know exactly what you are allergic to so that you can avoid your specific triggers. °SEEK MEDICAL CARE IF:  °· You have a fever. °· You develop a cough that does not stop easily (persistent). °· You have shortness of breath. °· You start wheezing. °· Symptoms interfere with normal daily activities. °Document Released: 05/03/2001 Document Revised: 08/13/2013 Document Reviewed: 04/15/2013 °ExitCare® Patient Information ©2015 [employer]. This information is not intended to replace advice given to you by your health care provider. Make sure you discuss any questions you have with your health care provider. ° ° °Emergency Department Resource Guide °1) Find a Doctor and Pay Out of Pocket °Although you won't have to find out who is covered by your insurance plan, it is a good idea to ask around and get recommendations. You will then need to call the office and see if the doctor you have chosen will accept you as a new patient and what types of options they offer for patients who are self-pay. Some doctors offer discounts or will set up payment plans for their patients who do not have insurance, but you will need to ask so you aren't surprised when you get to your appointment. ° °2) Contact Your Local Health Department °Not all health departments have doctors that can see patients for sick visits, but many do, so it is worth a call to see if yours does. If you don't know where your   local health department is, you can check in your phone book. The CDC also has a tool to help you locate your state's health department, and many state websites also have listings of all of their local health departments. ° °3) Find a Walk-in Clinic °If your illness is not likely to be very severe or  complicated, you may want to try a walk in clinic. These are popping up all over the country in pharmacies, drugstores, and shopping centers. They're usually staffed by nurse practitioners or physician assistants that have been trained to treat common illnesses and complaints. They're usually fairly quick and inexpensive. However, if you have serious medical issues or chronic medical problems, these are probably not your best option. ° °No Primary Care Doctor: °- Call [facility] at  832-8000 - they can help you locate a primary care doctor that  accepts your insurance, provides certain services, etc. °- Physician Referral Service- 1-800-533-3463 ° °Chronic Pain Problems: °Organization         Address  Phone   Notes  °[facility]  (336) 297-2271 Patients need to be referred by their primary care doctor.  ° °Medication Assistance: °Organization         Address  Phone   Notes  °[place] [facility] [address] °[place], [place] [address] (336) 641-8030 --Must be a resident of [place] °-- Must have NO insurance coverage whatsoever (no Medicaid/ Medicare, etc.) °-- The pt. MUST have a primary care doctor that directs their care regularly and follows them in the community °  °[facility]  (866) 331-1348   °[employer]  (888) 892-1162   ° °Agencies that provide inexpensive medical care: °Organization         Address  Phone   Notes  °[facility]  (336) 832-8035   °[facility]    (336) 832-7272   °[facility] [address] °[place], [place] [address] (336) 832-4777   °[facility] [address], °[place] (336) 271-4999   °[facility]    (336) 373-0678   °[facility]    (336) 272-1050   °[facility] ° [address], [place] Phone:  (336) 832-4444, Fax:  (336) 832-4440 Hours of Operation:  9 am - 6 pm, M-F.  Also accepts  Medicaid/Medicare and self-pay.  °[facility] ° [address], [place] Phone: (336) 832-3150, Fax: (336) 832-3151. Hours of Operation:  8:30 am - 5:30 pm, M-F.  Also accepts Medicaid and self-pay.  °[facility] [place] [address], [place] Phone: (336) 878-6027   °[facility] [address], [place], [place] (336)723-1848, Ext. 123 Mondays & Thursdays: 7-9 AM.  First 15 patients are seen on a first come, first serve basis. °  ° °Medicaid-accepting [place] Providers: ° °Organization         Address  Phone   Notes  °[facility] [address], [place] (336) 641-2100 Also accepts self-pay patients.  °[facility] [address], [place] ° (336) 856-9996   °[facility] [address], [place] (336) 288-8857   °[facility] [address] [place] [address], [place] (336) 299-7000   °Veita Bland [address],   ° (336) 373-1557 Only accepts [place] Access Medicaid patients after   they have their name applied to their card.  ° °Self-Pay (no insurance) in [place]: ° °Organization         Address  Phone   Notes  °Sickle Cell Patients, [facility] [address], [place] (336) 832-1970   °[facility] [address], [place] (336) 832-4400   °[facility] [place] ° [address], [place] (336) 992-4800   °[facility]/Dr. Osei-Bonsu ° [address] [place] [address], [place] or [address], [place] (336) 841-8500 Phone number for both [place] and [place] locations is the same.  °[facility] [address], [place] (336) 299-0000   °[facility] [address] [place] [address], [place] or [address] (336) 852-7530 °(336) 878-2260   °[facility] [address], [place] (336)  350-1642, phone; (336) 294-5005, fax Sees patients 1st and 3rd Saturday of every month.  Must not qualify for public or private insurance (i.e. Medicaid, Medicare, [place] Health Choice, Veterans' Benefits) • Household income should be no more than 200% of the poverty level •The clinic cannot treat you if you are pregnant or think you are pregnant • Sexually transmitted diseases are not treated at the clinic.  ° ° °Dental Care: °Organization         Address  Phone  Notes  °[facility] [address], [place] (336) 641-6152 Accepts children up to age 21 who are enrolled in Medicaid or [place] Health Choice; pregnant women with a Medicaid card; and children who have applied for Medicaid or [place] Health Choice, but were declined, whose parents can pay a reduced fee at time of service.  °[facility] [place]  [address], [place] (336) 641-7733 Accepts children up to age 21 who are enrolled in Medicaid or [place] Health Choice; pregnant women with a Medicaid card; and children who have applied for Medicaid or [place] Health Choice, but were declined, whose parents can pay a reduced fee at time of service.  °[facility] ° [address], [place] (336) 641-4533 Patients are seen by appointment only. Walk-ins are not accepted. [facility] will see patients 18 years of age and older. °Monday - Tuesday (8am-5pm) °Most Wednesdays (8:30-5pm) °$30 per visit, cash only  °[facility] ° [address], [place] (336) 641-4533 Patients are seen by appointment only. Walk-ins are not accepted. [facility] will see patients 18 years of age and older. °One Wednesday Evening (Monthly: Volunteer Based).  $30 per visit, cash only  °[facility]  (919) 537-3737 for adults; Children under age 4, call Graduate Pediatric Dentistry at ([phone]) [phone]. Children aged  4-14, please call (919) [phone] to request a pediatric application. ° Dental services are provided in all areas of dental care including fillings, crowns and bridges, complete and partial dentures, implants, gum treatment, root canals, and extractions. Preventive care is also provided. Treatment is provided to both adults and children. °Patients are selected via a lottery and there is often a waiting list. °  °[facility] [address], °[place] ° (336) 763-8833 [email or web address] °  °[facility] [address], [place], [place] (336)723-1848, Ext. 123 Second and Fourth Thursday of each month, opens at 6:30 AM; Clinic ends at 9 AM.  Patients are   seen on a first-come first-served basis, and a limited number are seen during each clinic.  ° °[facility] ° [address], [place], [place] (336) 723-7904   Eligibility Requirements °You must have lived in [place], [place], or [place] counties for at least the last three months. °  You cannot be eligible for state or federal sponsored healthcare insurance, including Veterans Administration, Medicaid, or Medicare. °  You generally cannot be eligible for healthcare insurance through your employer.  °  How to apply: °Eligibility screenings are held every Tuesday and Wednesday afternoon from 1:00 pm until 4:00 pm. You do not need an appointment for the interview!  °[facility] [address], [place], [place] 336-631-2330   °[facility]  336-342-8273   °[facility]  336-703-3100   °[place]  336-570-6415   ° °Behavioral Health Resources in the Community: °Intensive Outpatient Programs °Organization         Address  Phone  Notes  °[facility] [address], [place], [place] 336-878-6098   °[facility] [address], [place], [place] 336-832-9800   °[facility] [address],  [place], [place] ° 336-882-2125   °[facility] [address],  °[place], [place] 1-800-853-5163 or 336-641-4981   °Substance Abuse Resources °Organization         Address  Phone  Notes  °[facility]  336-882-2125   °[facility]  336-784-9470   °[facility]  336-285-9073   °[facility]  336-845-3988   °Residential & Outpatient Substance Abuse Program  1-800-659-3381   °Psychological Services °Organization         Address  Phone  Notes  °[facility]  336- 832-9600   °[facility]  336- 378-7881   °[facility] [address], [place] 1-800-853-5163 or 336-641-4981   ° °Mobile Crisis Teams °Organization         Address  Phone  Notes  °[facility], Mobile Crisis Care Unit  1-877-626-1772   °Assertive °[facility] ° [address] [place], [place] 336-834-9664   °Sharon DeEsch [address] °[place] 336-554-5454   ° °Self-Help/Support Groups °Organization         Address  Phone             Notes  °[facility] - variety of support groups  336- 373-1402 Call for more information  °Narcotics Anonymous (NA), [facility] [address], °[place]  2 meetings at this location  ° °Residential Treatment Programs °Organization         Address  Phone  Notes  °[facility] [address],    °[place]  1-866-801-8205   °[facility] ° [address], [place], [place] 704-293-8524   °[facility] [address], [place] 336-845-3988 Admissions: 8am-3pm M-F  °[facility] [address]    °[place], [place] 336-841-1104   °[facility] [address], [place], [place] 336-379-7146   °[facility] [address]  °[place], [place] 336-285-9073   °[facility] Intensive Outpatient [address], [place], [place] 336-852-3033   °[facility]  ([facility] [address]  °[place], [place] 1-877-615-2722 or 336-784-9470   °[facility] [address]   [place], [place] 336-227-7417 Accepts Medicaid  °[facility] [address]  °[place] 1-800-659-3381 Substance Abuse/Addiction Treatment  ° °[place] Behavioral Health Resources °Organization         Address  Phone  Notes  °[facility]  (888) 581-9988   °Julie Brannon, PhD [address] [place], [place]   (336) 349-5553 or (336) 951-0000   °[facility]   [address] °[place], [place] (336) 349-4454   °[facility] [address], [place], [place] (336) 342-8316 Insurance/Medicaid/sponsorship through [facility]  °[facility] [address]                                    [place], [place] (336) 342-8316 Therapy/tele-psych/case  °[facility] [address]  ° [place],  (336) 349-2233    °Dr. Arfeen  (336) 349-4544   °[facility] [place]  [employer] [place] [facility] 1) [address], [place] °2) [address], [place] °3)  [address], [place] (336) 349-3220 °(336) 342-7768 ° °(336) 342-8140   °[place] [facility] (336) 342-1394 or (336) 342-3537 (After Hours)    ° ° ° ° °

## 2014-11-27 ENCOUNTER — Telehealth (HOSPITAL_BASED_OUTPATIENT_CLINIC_OR_DEPARTMENT_OTHER): Payer: Self-pay | Admitting: Emergency Medicine

## 2014-11-28 LAB — CULTURE, GROUP A STREP

## 2014-12-16 ENCOUNTER — Encounter: Payer: Self-pay | Admitting: Infectious Disease

## 2014-12-16 ENCOUNTER — Ambulatory Visit (INDEPENDENT_AMBULATORY_CARE_PROVIDER_SITE_OTHER): Payer: Self-pay | Admitting: Infectious Disease

## 2014-12-16 VITALS — BP 149/92 | HR 75 | Temp 98.5°F | Ht 76.0 in | Wt 198.0 lb

## 2014-12-16 DIAGNOSIS — J029 Acute pharyngitis, unspecified: Secondary | ICD-10-CM | POA: Insufficient documentation

## 2014-12-16 DIAGNOSIS — H6500 Acute serous otitis media, unspecified ear: Secondary | ICD-10-CM

## 2014-12-16 DIAGNOSIS — I10 Essential (primary) hypertension: Secondary | ICD-10-CM

## 2014-12-16 DIAGNOSIS — H6690 Otitis media, unspecified, unspecified ear: Secondary | ICD-10-CM

## 2014-12-16 DIAGNOSIS — B20 Human immunodeficiency virus [HIV] disease: Secondary | ICD-10-CM

## 2014-12-16 DIAGNOSIS — J302 Other seasonal allergic rhinitis: Secondary | ICD-10-CM

## 2014-12-16 DIAGNOSIS — H6501 Acute serous otitis media, right ear: Secondary | ICD-10-CM

## 2014-12-16 HISTORY — DX: Other seasonal allergic rhinitis: J30.2

## 2014-12-16 HISTORY — DX: Otitis media, unspecified, unspecified ear: H66.90

## 2014-12-16 HISTORY — DX: Acute pharyngitis, unspecified: J02.9

## 2014-12-16 MED ORDER — ABACAVIR-DOLUTEGRAVIR-LAMIVUD 600-50-300 MG PO TABS: 1.0000 | ORAL_TABLET | Freq: Every day | ORAL | Status: DC

## 2014-12-16 MED ORDER — AMOXICILLIN-POT CLAVULANATE 875-125 MG PO TABS: 1.0000 | ORAL_TABLET | Freq: Two times a day (BID) | ORAL | Status: DC

## 2014-12-16 NOTE — Progress Notes (Signed)
Subjective:    Patient ID: Cody Barton, male    DOB: 12/19/1975, 39 y.o.   MRN: [id number]  HPI   39 year-old Afro-American with HIV infection acquired infection in whom we had started Complera and who has had perfect virological suppression. He admitted that he sometimes was taking the complera on an empty stomach which I said was absolutely a bad idea and we  switched him to [facility] and he has maintained perfect virological suppression since then.  Lab Results  Component Value Date   HIV1RNAQUANT <20 11/06/2014   Lab Results  Component Value Date   CD4TABS 650 11/06/2014   CD4TABS 530 09/03/2014   CD4TABS 660 10/10/2013   He comes to clinic acutely for complaints of sore throat and ear pressure difficulty listening through left ear that has not responded to OTC allergy meds. No fevers or chills.  Review of Systems  Constitutional: Negative for fever, chills, diaphoresis, activity change, appetite change, fatigue and unexpected weight change.  HENT: Negative for congestion, rhinorrhea, sinus pressure, sneezing, sore throat and trouble swallowing.   Eyes: Negative for photophobia and visual disturbance.  Respiratory: Negative for cough, chest tightness, shortness of breath, wheezing and stridor.   Cardiovascular: Negative for chest pain, palpitations and leg swelling.  Gastrointestinal: Negative for nausea, vomiting, abdominal pain, diarrhea, constipation, blood in stool, abdominal distention and anal bleeding.  Genitourinary: Negative for dysuria, hematuria, flank pain and difficulty urinating.  Musculoskeletal: Negative for myalgias, back pain, joint swelling and gait problem.  Skin: Negative for color change, pallor, rash and wound.  Neurological: Negative for dizziness, tremors, weakness and light-headedness.  Hematological: Negative for adenopathy. Does not bruise/bleed easily.  Psychiatric/Behavioral: Negative for behavioral problems, confusion, sleep disturbance,  dysphoric mood, decreased concentration and agitation.       Objective:   Physical Exam  Constitutional: He is oriented to person, place, and time. He appears well-nourished. No distress.  HENT:  Head: Normocephalic and atraumatic.  Right Ear: No drainage, swelling or tenderness. No decreased hearing is noted.  Left Ear: No drainage, swelling or tenderness. A middle ear effusion is present. Decreased hearing is noted.  Mouth/Throat: Oropharynx is clear and moist. No oropharyngeal exudate.  Eyes: Conjunctivae and EOM are normal. No scleral icterus.  Neck: Normal range of motion. Neck supple.  Cardiovascular: Normal rate, regular rhythm and normal heart sounds.  Exam reveals no gallop and no friction rub.   No murmur heard. Pulmonary/Chest: Effort normal and breath sounds normal. No respiratory distress. He has no wheezes. He has no rales. He exhibits no tenderness.  Abdominal: He exhibits no distension and no mass. There is no tenderness. There is no rebound and no guarding.  Musculoskeletal: He exhibits no edema or tenderness.       Right shoulder: He exhibits normal range of motion, no tenderness and no bony tenderness.  Neurological: He is alert and oriented to person, place, and time. He exhibits normal muscle tone. Coordination normal.  Skin: Skin is warm and dry. He is not diaphoretic.  Psychiatric: He has a normal mood and affect. His behavior is normal. Judgment and thought content normal.          Assessment & Plan:   Seasonal allergies now with possible superimposed OMedia --augmentin for 10 days  Sore throat: residual from #1. He denies giving any oral sex (I am not an oral person) so did not test for GC from throat  HIV: perfectly suppressed renews ADAP with Baird Lyons   HTN: likely need reinstuttion  of blood pressure agent at next visit   Smoker: counselled to quit with greater than 3 minutes spent counselling pt.

## 2015-02-02 ENCOUNTER — Encounter (HOSPITAL_COMMUNITY): Payer: Self-pay | Admitting: Family Medicine

## 2015-02-02 ENCOUNTER — Emergency Department (HOSPITAL_COMMUNITY)
Admission: EM | Admit: 2015-02-02 | Discharge: 2015-02-02 | Disposition: A | Discharge status: Home / Self Care | Payer: Self-pay | Attending: Emergency Medicine | Admitting: Emergency Medicine

## 2015-02-02 DIAGNOSIS — Z8669 Personal history of other diseases of the nervous system and sense organs: Secondary | ICD-10-CM | POA: Insufficient documentation

## 2015-02-02 DIAGNOSIS — Z8619 Personal history of other infectious and parasitic diseases: Secondary | ICD-10-CM | POA: Insufficient documentation

## 2015-02-02 DIAGNOSIS — Z720 Tobacco use: Secondary | ICD-10-CM | POA: Insufficient documentation

## 2015-02-02 DIAGNOSIS — Z79899 Other long term (current) drug therapy: Secondary | ICD-10-CM | POA: Insufficient documentation

## 2015-02-02 DIAGNOSIS — Z202 Contact with and (suspected) exposure to infections with a predominantly sexual mode of transmission: Secondary | ICD-10-CM | POA: Insufficient documentation

## 2015-02-02 DIAGNOSIS — Z792 Long term (current) use of antibiotics: Secondary | ICD-10-CM | POA: Insufficient documentation

## 2015-02-02 DIAGNOSIS — Z21 Asymptomatic human immunodeficiency virus [HIV] infection status: Secondary | ICD-10-CM | POA: Insufficient documentation

## 2015-02-02 DIAGNOSIS — R369 Urethral discharge, unspecified: Secondary | ICD-10-CM | POA: Insufficient documentation

## 2015-02-02 DIAGNOSIS — Z711 Person with feared health complaint in whom no diagnosis is made: Secondary | ICD-10-CM

## 2015-02-02 MED ORDER — CEFTRIAXONE SODIUM 250 MG IJ SOLR
250.0000 mg | Freq: Once | INTRAMUSCULAR | Status: AC
  Administered 2015-02-02: 250 mg via INTRAMUSCULAR
  Filled 2015-02-02: qty 250

## 2015-02-02 MED ORDER — AZITHROMYCIN 250 MG PO TABS
1000.0000 mg | ORAL_TABLET | Freq: Once | ORAL | Status: AC
Start: 2015-02-02 — End: 2015-02-02
  Administered 2015-02-02: 1000 mg via ORAL
  Filled 2015-02-02: qty 4

## 2015-02-02 NOTE — ED Notes (Signed)
Pt here for discharge from penis. sts yellow. Hx of same.

## 2015-02-02 NOTE — Discharge Instructions (Signed)
°  Refrain from sexual intercourse for 7 days. Be sure to have all partners tested and treated for STDs.  Practice safe sex by always wearing condoms.  ° °

## 2015-02-02 NOTE — ED Provider Notes (Signed)
CSN: [id number]     Arrival date & time 02/02/15  1651 History   This chart was scribed for non-physician practitioner, Junius Finner, PA-C working with Blake Divine, MD by Doreatha Martin, ED scribe. This patient was seen in room TR09C/TR09C and the patient's care was started at 5:25 PM    Chief Complaint  Patient presents with  . Exposure to STD   The history is provided by the patient. No language interpreter was used.    HPI Comments: Cody Barton is a 39 y.o. male with Hx of HIV who presents to the Emergency Department complaining of possible STD exposure and requesting an STD panel. Pt states associated yellow penile discharge onset today. He states that he has recently had unprotected sex. Pt reports that he made an appointment with the doctor following his HIV regarding his symptoms, but wanted to get checked out sooner. He denies contact or exposure with known STDs. He also denies fevers, nausea and vomiting.   Past Medical History  Diagnosis Date  . Gonorrhea   . HIV (human immunodeficiency virus infection)   . Otitis media 12/16/2014  . Seasonal allergies 12/16/2014  . Sore throat 12/16/2014   History reviewed. No pertinent past surgical history. Family History  Problem Relation Age of Onset  . Hypertension Father   . Cancer Maternal Grandfather    History  Substance Use Topics  . Smoking status: Current Every Day Smoker -- 0.30 packs/day for 1.5 years    Types: Cigarettes  . Smokeless tobacco: Never Used     Comment:  4-6 day  . Alcohol Use: 0.0 oz/week    0 Standard drinks or equivalent per week     Comment:    weekend    Review of Systems  Constitutional: Negative for fever.  Gastrointestinal: Negative for nausea and vomiting.  Genitourinary: Positive for discharge.  All other systems reviewed and are negative.  Allergies  Review of patient's allergies indicates no known allergies.  Home Medications   Prior to Admission medications   Medication Sig Start  Date End Date Taking? Authorizing Provider  Abacavir-Dolutegravir-Lamivud (TRIUMEQ) 600-50-300 MG TABS Take 1 tablet by mouth daily. 12/16/14   Randall Hiss, MD  amoxicillin-clavulanate (AUGMENTIN) 875-125 MG per tablet Take 1 tablet by mouth 2 (two) times daily. 12/16/14   Randall Hiss, MD  cetirizine-pseudoephedrine (ZYRTEC-D) 5-120 MG per tablet Take 1 tablet by mouth 2 (two) times daily. Patient not taking: Reported on 12/16/2014 11/26/14   Ladona Mow, PA-C  Multiple Vitamins-Minerals (MULTI-VITAMIN GUMMIES PO) Take 1 tablet by mouth daily.    Historical Provider, MD  sodium chloride (OCEAN) 0.65 % SOLN nasal spray Place 1 spray into both nostrils as needed for congestion. Patient not taking: Reported on 12/16/2014 11/26/14   Ladona Mow, PA-C   BP 143/88 mmHg  Pulse 84  Temp(Src) 98 F (36.7 C)  Resp 18  SpO2 96% Physical Exam  Constitutional: He is oriented to person, place, and time. He appears well-developed and well-nourished.  HENT:  Head: Normocephalic and atraumatic.  Eyes: EOM are normal.  Neck: Normal range of motion.  Cardiovascular: Normal rate.   Pulmonary/Chest: Effort normal.  Genitourinary: Testes normal and penis normal. Right testis shows no mass, no swelling and no tenderness. Left testis shows no mass, no swelling and no tenderness. Circumcised. No phimosis, paraphimosis, hypospadias, penile erythema or penile tenderness. No discharge found.  Chaperoned exam.   Musculoskeletal: Normal range of motion.  Neurological: He is alert and oriented  to person, place, and time.  Skin: Skin is warm and dry.  Psychiatric: He has a normal mood and affect. His behavior is normal.  Nursing note and vitals reviewed.   ED Course  Procedures (including critical care time) DIAGNOSTIC STUDIES: Oxygen Saturation is 96% on RA, adequate by my interpretation.    COORDINATION OF CARE: 5:31 PM Discussed treatment plan with pt at bedside and pt agreed to plan.   Labs  Review Labs Reviewed  GC/CHLAMYDIA PROBE AMP () NOT AT [facility]    Imaging Review No results found.   EKG Interpretation None      MDM   Final diagnoses:  Concern about STD in male without diagnosis  Penile discharge    Pt presenting to ED with concern for STD due to penile discharge. Hx of unprotected sex.  Normal GU exam.  GC/Chlamydia swab sent to lab. Empirical treatment with azithromycin and rocephin given in ED. Home care instructions provided. Advised to f/u with PCP at [facility] as well as Health Department for recheck of symptoms and further treatment if needed.   I personally performed the services described in this documentation, which was scribed in my presence. The recorded information has been reviewed and is accurate.  d scribe attestation statement}   Junius Finner, PA-C 02/02/15 1823  Blake Divine, MD 02/02/15 [phone]

## 2015-02-02 NOTE — ED Notes (Signed)
Pt verbalizes understanding of d/c instructions and denies any further needs at this time. 

## 2015-02-03 LAB — GC/CHLAMYDIA PROBE AMP (~~LOC~~) NOT AT ARMC
Chlamydia: NEGATIVE
Neisseria Gonorrhea: POSITIVE — AB

## 2015-02-04 ENCOUNTER — Telehealth (HOSPITAL_BASED_OUTPATIENT_CLINIC_OR_DEPARTMENT_OTHER): Payer: Self-pay | Admitting: Emergency Medicine

## 2015-02-04 NOTE — Telephone Encounter (Signed)
Notified pt of Gonorrhea, educated re abstinence and notification of sexual partner(s)

## 2015-02-18 ENCOUNTER — Other Ambulatory Visit (INDEPENDENT_AMBULATORY_CARE_PROVIDER_SITE_OTHER): Payer: Self-pay

## 2015-02-18 DIAGNOSIS — B20 Human immunodeficiency virus [HIV] disease: Secondary | ICD-10-CM

## 2015-02-18 DIAGNOSIS — Z113 Encounter for screening for infections with a predominantly sexual mode of transmission: Secondary | ICD-10-CM

## 2015-02-18 DIAGNOSIS — Z79899 Other long term (current) drug therapy: Secondary | ICD-10-CM

## 2015-02-18 LAB — COMPLETE METABOLIC PANEL WITH GFR
ALBUMIN: 4 g/dL (ref 3.5–5.2)
ALT: 14 U/L (ref 0–53)
AST: 16 U/L (ref 0–37)
Alkaline Phosphatase: 85 U/L (ref 39–117)
BUN: 11 mg/dL (ref 6–23)
CALCIUM: 9.7 mg/dL (ref 8.4–10.5)
CHLORIDE: 104 meq/L (ref 96–112)
CO2: 27 mEq/L (ref 19–32)
CREATININE: 1.03 mg/dL (ref 0.50–1.35)
GFR, Est African American: >89 mL/min
GFR, Est Non African American: >89 mL/min
Glucose, Bld: 109 mg/dL — ABNORMAL HIGH (ref 70–99)
POTASSIUM: 4.4 meq/L (ref 3.5–5.3)
SODIUM: 138 meq/L (ref 135–145)
Total Bilirubin: 0.7 mg/dL (ref 0.2–1.2)
Total Protein: 7.6 g/dL (ref 6.0–8.3)

## 2015-02-18 LAB — LIPID PANEL
Cholesterol: 119 mg/dL (ref 0–200)
HDL: 38 mg/dL — ABNORMAL LOW (ref >=40)
LDL Cholesterol: 67 mg/dL (ref 0–99)
Total CHOL/HDL Ratio: 3.1 Ratio
Triglycerides: 68 mg/dL (ref <150)
VLDL: 14 mg/dL (ref 0–40)

## 2015-02-18 LAB — CBC WITH DIFFERENTIAL/PLATELET
Basophils Absolute: 0 10*3/uL (ref 0.0–0.1)
Basophils Relative: 1 % (ref 0–1)
EOS PCT: 3 % (ref 0–5)
Eosinophils Absolute: 0.1 10*3/uL (ref 0.0–0.7)
HCT: 45.2 % (ref 39.0–52.0)
HEMOGLOBIN: 14.9 g/dL (ref 13.0–17.0)
LYMPHS PCT: 39 % (ref 12–46)
Lymphs Abs: 1.7 10*3/uL (ref 0.7–4.0)
MCH: 31.2 pg (ref 26.0–34.0)
MCHC: 33 g/dL (ref 30.0–36.0)
MCV: 94.6 fL (ref 78.0–100.0)
MPV: 9.2 fL (ref 8.6–12.4)
Monocytes Absolute: 0.4 10*3/uL (ref 0.1–1.0)
Monocytes Relative: 10 % (ref 3–12)
NEUTROS ABS: 2.1 10*3/uL (ref 1.7–7.7)
NEUTROS PCT: 47 % (ref 43–77)
PLATELETS: 293 10*3/uL (ref 150–400)
RBC: 4.78 MIL/uL (ref 4.22–5.81)
RDW: 12.3 % (ref 11.5–15.5)
WBC: 4.4 10*3/uL (ref 4.0–10.5)

## 2015-02-18 LAB — RPR

## 2015-02-18 NOTE — Addendum Note (Signed)
Addended by: Mariea ClontsGREEN, Jeree Delcid D on: 02/18/2015 10:37 AM   Modules accepted: Orders

## 2015-02-19 LAB — URINE CYTOLOGY ANCILLARY ONLY
CHLAMYDIA, DNA PROBE: NEGATIVE
NEISSERIA GONORRHEA: NEGATIVE

## 2015-02-19 LAB — T-HELPER CELL (CD4) - (RCID CLINIC ONLY)
CD4 T CELL ABS: 580 /uL (ref 400–2700)
CD4 T CELL HELPER: 32 % — AB (ref 33–55)

## 2015-02-19 LAB — MICROALBUMIN / CREATININE URINE RATIO
Creatinine, Urine: 205.9 mg/dL
Microalb Creat Ratio: 4.9 mg/g (ref 0.0–30.0)
Microalb, Ur: 1 mg/dL (ref <2.0)

## 2015-02-20 LAB — HIV-1 RNA QUANT-NO REFLEX-BLD
HIV 1 RNA Quant: <20 copies/mL (ref <20)
HIV-1 RNA Quant, Log: <1.3 {Log} (ref <1.30)

## 2015-03-04 ENCOUNTER — Ambulatory Visit (INDEPENDENT_AMBULATORY_CARE_PROVIDER_SITE_OTHER): Payer: Self-pay | Admitting: Infectious Disease

## 2015-03-04 ENCOUNTER — Encounter: Payer: Self-pay | Admitting: Infectious Disease

## 2015-03-04 VITALS — BP 134/94 | HR 88 | Temp 98.0°F | Wt 197.0 lb

## 2015-03-04 DIAGNOSIS — I10 Essential (primary) hypertension: Secondary | ICD-10-CM

## 2015-03-04 DIAGNOSIS — A549 Gonococcal infection, unspecified: Secondary | ICD-10-CM

## 2015-03-04 DIAGNOSIS — B20 Human immunodeficiency virus [HIV] disease: Secondary | ICD-10-CM

## 2015-03-04 NOTE — Progress Notes (Signed)
Patient ID: Cody Barton, male   DOB: 11-27-1975, 39 y.o.   MRN: [id number]   I had addended his note but EPic is not letting me update note.  INFECTIOUS DISEASE ATTENDING ADDENDUM:     [facility]   Date: 03/04/2015  Patient name: Cody Barton  Medical record number: [id number]  Date of birth: 11-27-1975    This patient has been seen and discussed with the house staff. Please see their note for complete details. I concur with their findings with the following additions/corrections:  His virus is controlled, BP needs to be followed.  I spent greater than 25 minutes with the patient including greater than 50% of time in face to face counsel of the patient re HIV and HTN and in coordination of their care.   Paulette BlanchCornelius Van Dam 03/04/2015, 12:00 PM

## 2015-03-04 NOTE — Progress Notes (Signed)
3rd attempt to addend

## 2015-03-04 NOTE — Progress Notes (Signed)
Subjective:    Patient ID: Cody Barton, male    DOB: Jan 17, 1976, 38 y.o.   MRN: [id number]  HPI Cody Barton is a 39 y/o male with PMH of HIV and Hx of Gonorrhea here for routine f/u of HIV. His last CD4 count late June was 540/VL <20. He has been tolerating hisTriumeq well. He does take his Triumeq at different times during the day and has missed 2 doses in the last month.  He also was seen last month in the ED for penile drainage and was found to be positive for gonorrhea. Both he and his sexual partner were treated. He no longer has symptoms. His last urine cytology was negative for CG. He is the insertive partner and states he never engages in receptive anal or oral sex. His blood pressure today and at his prior visits have been elevated. He states he has never taken a blood pressure medicine.   Patient Active Problem List   Diagnosis Date Noted  . Otitis media 12/16/2014  . Seasonal allergies 12/16/2014  . Sore throat 12/16/2014  . HIV disease 10/10/2012  . HTN (hypertension) 10/10/2012  . Gonorrhea    Outpatient Encounter Prescriptions as of 03/04/2015  Medication Sig  . Abacavir-Dolutegravir-Lamivud (TRIUMEQ) 600-50-300 MG TABS Take 1 tablet by mouth daily.  . Multiple Vitamins-Minerals (MULTI-VITAMIN GUMMIES PO) Take 1 tablet by mouth daily.  . [DISCONTINUED] amoxicillin-clavulanate (AUGMENTIN) 875-125 MG per tablet Take 1 tablet by mouth 2 (two) times daily.  . [DISCONTINUED] cetirizine-pseudoephedrine (ZYRTEC-D) 5-120 MG per tablet Take 1 tablet by mouth 2 (two) times daily. (Patient not taking: Reported on 12/16/2014)  . [DISCONTINUED] sodium chloride (OCEAN) 0.65 % SOLN nasal spray Place 1 spray into both nostrils as needed for congestion. (Patient not taking: Reported on 12/16/2014)   No facility-administered encounter medications on file as of 03/04/2015.    Review of Systems  Constitutional: Negative for fever, appetite change, fatigue and unexpected weight change.    HENT: Negative for ear discharge, ear pain, facial swelling, mouth sores, rhinorrhea, sinus pressure and trouble swallowing.   Eyes: Negative for photophobia and visual disturbance.  Respiratory: Negative for cough and shortness of breath.   Cardiovascular: Negative for chest pain and leg swelling.  Gastrointestinal: Negative for abdominal pain, diarrhea, constipation and abdominal distention.  Genitourinary: Negative for discharge, difficulty urinating and penile pain.  Musculoskeletal: Negative for myalgias and arthralgias.  Skin: Negative for rash and wound.  Neurological: Negative for dizziness, weakness and headaches.  Hematological: Negative for adenopathy.  Psychiatric/Behavioral: Negative for suicidal ideas, behavioral problems, sleep disturbance, self-injury and agitation. The patient is nervous/anxious.        Objective:   Physical Exam  Constitutional: He is oriented to person, place, and time. He appears well-developed and well-nourished.  HENT:  Head: Normocephalic and atraumatic.  Mouth/Throat: No oropharyngeal exudate.  Eyes: Conjunctivae are normal. Pupils are equal, round, and reactive to light.  Neck: Normal range of motion. Neck supple.  Cardiovascular: Normal rate and regular rhythm.   Pulmonary/Chest: Effort normal and breath sounds normal.  Abdominal: Soft. Bowel sounds are normal.  Musculoskeletal: Normal range of motion.  Neurological: He is alert and oriented to person, place, and time.  Skin: Skin is warm and dry.  Psychiatric: He has a normal mood and affect. His behavior is normal. Judgment and thought content normal.   Blood pressure 134/94, pulse 88, temperature 98 F (36.7 C), temperature source Oral, weight 197 lb (89.359 kg).    Lab Results  Component  Value Date   CD4TABS 580 02/18/2015   CD4TABS 650 11/06/2014   CD4TABS 530 09/03/2014   Lab Results  Component Value Date   HIV1RNAQUANT <20 02/18/2015   CBC    Component Value Date/Time    WBC 4.4 02/18/2015 1017   RBC 4.78 02/18/2015 1017   HGB 14.9 02/18/2015 1017   HCT 45.2 02/18/2015 1017   PLT 293 02/18/2015 1017   MCV 94.6 02/18/2015 1017   MCH 31.2 02/18/2015 1017   MCHC 33.0 02/18/2015 1017   RDW 12.3 02/18/2015 1017   LYMPHSABS 1.7 02/18/2015 1017   MONOABS 0.4 02/18/2015 1017   EOSABS 0.1 02/18/2015 1017   BASOSABS 0.0 02/18/2015 1017    BMET    Component Value Date/Time   NA 138 02/18/2015 1017   K 4.4 02/18/2015 1017   CL 104 02/18/2015 1017   CO2 27 02/18/2015 1017   GLUCOSE 109* 02/18/2015 1017   BUN 11 02/18/2015 1017   CREATININE 1.03 02/18/2015 1017   CREATININE 0.87 03/16/2011 1945   CALCIUM 9.7 02/18/2015 1017   GFRNONAA >89 02/18/2015 1017   GFRNONAA >60 03/16/2011 1945   GFRAA >89 02/18/2015 1017   GFRAA >60 03/16/2011 1945       Assessment & Plan:  HIV, well controlled: Has had some variability in the times he takes Triumeq as well as a couple of missed doses.  - Encouraged him to set an alarm to help him remember to take his medicines around the same time everyday. - Recheck labs and RTC in 6 months  HTN: States he feels nervous when he comes into clinic and believes this is why his blood pressure is high. - Will check blood pressures at home and keep a blood pressure diary - Asked that he call if his blood pressures are consistently >140 systolic. - Low threshold for restarting anti-hypertensive.

## 2015-05-20 NOTE — Progress Notes (Signed)
Approval faxed to [facility]. Howell, Michelle M, RN  

## 2015-06-24 ENCOUNTER — Emergency Department (HOSPITAL_COMMUNITY)
Admission: EM | Admit: 2015-06-24 | Discharge: 2015-06-24 | Disposition: A | Discharge status: Home / Self Care | Payer: Self-pay | Attending: Emergency Medicine | Admitting: Emergency Medicine

## 2015-06-24 ENCOUNTER — Encounter (HOSPITAL_COMMUNITY): Payer: Self-pay | Admitting: Family Medicine

## 2015-06-24 DIAGNOSIS — Z8669 Personal history of other diseases of the nervous system and sense organs: Secondary | ICD-10-CM | POA: Insufficient documentation

## 2015-06-24 DIAGNOSIS — R61 Generalized hyperhidrosis: Secondary | ICD-10-CM | POA: Insufficient documentation

## 2015-06-24 DIAGNOSIS — F17210 Nicotine dependence, cigarettes, uncomplicated: Secondary | ICD-10-CM | POA: Insufficient documentation

## 2015-06-24 DIAGNOSIS — J020 Streptococcal pharyngitis: Secondary | ICD-10-CM | POA: Insufficient documentation

## 2015-06-24 DIAGNOSIS — Z21 Asymptomatic human immunodeficiency virus [HIV] infection status: Secondary | ICD-10-CM | POA: Insufficient documentation

## 2015-06-24 DIAGNOSIS — Z8619 Personal history of other infectious and parasitic diseases: Secondary | ICD-10-CM | POA: Insufficient documentation

## 2015-06-24 DIAGNOSIS — Z79899 Other long term (current) drug therapy: Secondary | ICD-10-CM | POA: Insufficient documentation

## 2015-06-24 LAB — RAPID STREP SCREEN (MED CTR MEBANE ONLY): Streptococcus, Group A Screen (Direct): POSITIVE — AB

## 2015-06-24 MED ORDER — PENICILLIN V POTASSIUM 500 MG PO TABS: 500.0000 mg | ORAL_TABLET | Freq: Two times a day (BID) | ORAL | Status: AC

## 2015-06-24 NOTE — ED Notes (Signed)
Pt here for sore throat and sig swelling to tonsils with difficulty swallowing.

## 2015-06-24 NOTE — ED Provider Notes (Signed)
CSN: [id number]     Arrival date & time 06/24/15  1139 History  By signing my name below, I, Ronney LionSuzanne Le, attest that this documentation has been prepared under the direction and in the presence of Joud Ingwersen, PA-C. Electronically Signed: Ronney LionSuzanne Le, ED Scribe. 06/24/2015. 1:03 PM.    Chief Complaint  Patient presents with  . Sore Throat   The history is provided by the patient. No language interpreter was used.    HPI Comments: Cody Barton is a 39 y.o. male who presents to the Emergency Department complaining of a constant, 8/10 sore throat with associated tonsillar swelling that began 2 days ago. He states the pain radiates into his ears. Patient endorses a subjective fever with diaphoresis, although he states he did not measure his temperature. He reports he had tried Tylenol and hot tea with mild relief to his symptoms. He reports some increased pain with swallowing but is able to tolerate fluids. He denies rhinorrhea or congestion. He is not having difficulty breathing. Denies headache, eye pain, eye discharge, neck pain, shortness of breath, abdominal pain, nausea or vomiting.   Past Medical History  Diagnosis Date  . Gonorrhea   . HIV (human immunodeficiency virus infection) (HCC)   . Otitis media 12/16/2014  . Seasonal allergies 12/16/2014  . Sore throat 12/16/2014   History reviewed. No pertinent past surgical history. Family History  Problem Relation Age of Onset  . Hypertension Father   . Cancer Maternal Grandfather    Social History  Substance Use Topics  . Smoking status: Light Tobacco Smoker -- 0.30 packs/day for 1.5 years    Types: Cigarettes  . Smokeless tobacco: Never Used     Comment:  4-6 day  . Alcohol Use: 0.0 oz/week    0 Standard drinks or equivalent per week     Comment:    weekend    Review of Systems  Constitutional: Positive for fever (subjective) and diaphoresis.  HENT: Positive for sore throat. Negative for congestion, ear pain, rhinorrhea and  trouble swallowing.   Eyes: Negative for pain and discharge.  Respiratory: Negative for cough and shortness of breath.   Cardiovascular: Negative for chest pain.  Gastrointestinal: Negative for nausea, vomiting and abdominal pain.  Neurological: Negative for headaches.  Hematological: Positive for adenopathy.    Allergies  Review of patient's allergies indicates no known allergies.  Home Medications   Prior to Admission medications   Medication Sig Start Date End Date Taking? Authorizing Provider  Abacavir-Dolutegravir-Lamivud (TRIUMEQ) 600-50-300 MG TABS Take 1 tablet by mouth daily. 12/16/14   Randall Hissornelius N Van Dam, MD  Multiple Vitamins-Minerals (MULTI-VITAMIN GUMMIES PO) Take 1 tablet by mouth daily.    Historical Provider, MD  penicillin v potassium (VEETID) 500 MG tablet Take 1 tablet (500 mg total) by mouth 2 (two) times daily. 06/24/15 07/01/15  Clarkson Rosselli, PA-C   BP 150/93 mmHg  Pulse 97  Temp(Src) 99.9 F (37.7 C) (Oral)  Resp 17  Ht 6\' 1"  (1.854 m)  Wt 205 lb (92.987 kg)  BMI 27.05 kg/m2  SpO2 100% Physical Exam  Constitutional: He is oriented to person, place, and time. He appears well-developed and well-nourished. No distress.  HENT:  Head: Normocephalic and atraumatic.  Right Ear: Tympanic membrane and ear canal normal.  Left Ear: Tympanic membrane and ear canal normal.  Mouth/Throat: Uvula is midline and mucous membranes are normal. Oropharyngeal exudate, posterior oropharyngeal edema and posterior oropharyngeal erythema present. No tonsillar abscesses.  Eyes: Conjunctivae and EOM are normal.  Neck: Neck supple. No tracheal deviation present.  Cardiovascular: Normal rate.   Pulmonary/Chest: Effort normal and breath sounds normal. No respiratory distress. He has no wheezes.  Musculoskeletal: Normal range of motion.  Moves extremities spontaneously  Lymphadenopathy:    He has cervical adenopathy.  Neurological: He is alert and oriented to person, place, and  time.  Skin: Skin is warm and dry.  Psychiatric: He has a normal mood and affect. His behavior is normal.  Nursing note and vitals reviewed.   ED Course  Procedures (including critical care time)  DIAGNOSTIC STUDIES: Oxygen Saturation is 98% on RA, normal by my interpretation.    COORDINATION OF CARE: 1:06 PM - Discussed treatment plan with pt at bedside which includes Rx antibiotics and rest. Will give work note. Pt verbalized understanding and agreed to plan.   Labs Review Labs Reviewed  RAPID STREP SCREEN (NOT AT [facility]) - Abnormal; Notable for the following:    Streptococcus, Group A Screen (Direct) POSITIVE (*)    All other components within normal limits    MDM   Final diagnoses:  Strep pharyngitis   Pt presenting with sore throat, painful swallowing and swollen tonsils x 2 days. Subjective fevers. Afebrile in ED. Tonsillar exudate, erythema and edema noted. Pt handling secretions well. No SOB or difficulty breathing. Rapid strep positive. Will treat with PCN. Return precautions given in discharge paperwork and discussed with pt at bedside. Pt stable for discharge   I personally performed the services described in this documentation, which was scribed in my presence. The recorded information has been reviewed and is accurate.     Alveta Heimlich, PA-C 06/24/15 1329  Linwood Dibbles, MD 06/26/15 1220

## 2015-06-24 NOTE — Discharge Instructions (Signed)
- Use OTC pain relievers such as tylenol or advil. May use chloraseptic throat spray as well.  - Take penicillin twice a day for 10 days   Strep Throat Strep throat is a bacterial infection of the throat. Your health care provider may call the infection tonsillitis or pharyngitis, depending on whether there is swelling in the tonsils or at the back of the throat. Strep throat is most common during the cold months of the year in children who are 81-32 years of age, but it can happen during any season in people of any age. This infection is spread from person to person (contagious) through coughing, sneezing, or close contact. CAUSES Strep throat is caused by the bacteria called Streptococcus pyogenes. RISK FACTORS This condition is more likely to develop in:  People who spend time in crowded places where the infection can spread easily.  People who have close contact with someone who has strep throat. SYMPTOMS Symptoms of this condition include:  Fever or chills.   Redness, swelling, or pain in the tonsils or throat.  Pain or difficulty when swallowing.  White or yellow spots on the tonsils or throat.  Swollen, tender glands in the neck or under the jaw.  Red rash all over the body (rare). DIAGNOSIS This condition is diagnosed by performing a rapid strep test or by taking a swab of your throat (throat culture test). Results from a rapid strep test are usually ready in a few minutes, but throat culture test results are available after one or two days. TREATMENT This condition is treated with antibiotic medicine. HOME CARE INSTRUCTIONS Medicines  Take over-the-counter and prescription medicines only as told by your health care provider.  Take your antibiotic as told by your health care provider. Do not stop taking the antibiotic even if you start to feel better.  Have family members who also have a sore throat or fever tested for strep throat. They may need antibiotics if they  have the strep infection. Eating and Drinking  Do not share food, drinking cups, or personal items that could cause the infection to spread to other people.  If swallowing is difficult, try eating soft foods until your sore throat feels better.  Drink enough fluid to keep your urine clear or pale yellow. General Instructions  Gargle with a salt-water mixture 3-4 times per day or as needed. To make a salt-water mixture, completely dissolve -1 tsp of salt in 1 cup of warm water.  Make sure that all household members wash their hands well.  Get plenty of rest.  Stay home from school or work until you have been taking antibiotics for 24 hours.  Keep all follow-up visits as told by your health care provider. This is important. SEEK MEDICAL CARE IF:  The glands in your neck continue to get bigger.  You develop a rash, cough, or earache.  You cough up a thick liquid that is green, yellow-brown, or bloody.  You have pain or discomfort that does not get better with medicine.  Your problems seem to be getting worse rather than better.  You have a fever. SEEK IMMEDIATE MEDICAL CARE IF:  You have new symptoms, such as vomiting, severe headache, stiff or painful neck, chest pain, or shortness of breath.  You have severe throat pain, drooling, or changes in your voice.  You have swelling of the neck, or the skin on the neck becomes red and tender.  You have signs of dehydration, such as fatigue, dry mouth, and  decreased urination.  You become increasingly sleepy, or you cannot wake up completely.  Your joints become red or painful.   This information is not intended to replace advice given to you by your health care provider. Make sure you discuss any questions you have with your health care provider.   Document Released: 08/05/2000 Document Revised: 04/29/2015 Document Reviewed: 12/01/2014 Elsevier Interactive Patient Education [employer]2016 Elsevier Inc.

## 2015-09-16 ENCOUNTER — Encounter: Payer: Self-pay | Admitting: Infectious Disease

## 2015-09-16 ENCOUNTER — Ambulatory Visit (INDEPENDENT_AMBULATORY_CARE_PROVIDER_SITE_OTHER): Payer: Self-pay | Admitting: Infectious Disease

## 2015-09-16 VITALS — BP 140/90 | HR 79 | Temp 98.4°F | Wt 202.0 lb

## 2015-09-16 DIAGNOSIS — Z23 Encounter for immunization: Secondary | ICD-10-CM

## 2015-09-16 DIAGNOSIS — Z113 Encounter for screening for infections with a predominantly sexual mode of transmission: Secondary | ICD-10-CM

## 2015-09-16 DIAGNOSIS — B20 Human immunodeficiency virus [HIV] disease: Secondary | ICD-10-CM

## 2015-09-16 DIAGNOSIS — I10 Essential (primary) hypertension: Secondary | ICD-10-CM

## 2015-09-16 DIAGNOSIS — A549 Gonococcal infection, unspecified: Secondary | ICD-10-CM

## 2015-09-16 LAB — COMPLETE METABOLIC PANEL WITH GFR
ALT: 23 U/L (ref 9–46)
AST: 21 U/L (ref 10–40)
Albumin: 3.7 g/dL (ref 3.6–5.1)
Alkaline Phosphatase: 85 U/L (ref 40–115)
BUN: 11 mg/dL (ref 7–25)
CHLORIDE: 106 mmol/L (ref 98–110)
CO2: 25 mmol/L (ref 20–31)
CREATININE: 0.99 mg/dL (ref 0.60–1.35)
Calcium: 9.4 mg/dL (ref 8.6–10.3)
GFR, Est African American: >89 mL/min (ref >=60)
GFR, Est Non African American: >89 mL/min (ref >=60)
GLUCOSE: 82 mg/dL (ref 65–99)
Potassium: 4.4 mmol/L (ref 3.5–5.3)
SODIUM: 136 mmol/L (ref 135–146)
Total Bilirubin: 0.6 mg/dL (ref 0.2–1.2)
Total Protein: 7.4 g/dL (ref 6.1–8.1)

## 2015-09-16 LAB — CBC WITH DIFFERENTIAL/PLATELET
BASOS PCT: 1 % (ref 0–1)
Basophils Absolute: 0.1 10*3/uL (ref 0.0–0.1)
EOS ABS: 0.1 10*3/uL (ref 0.0–0.7)
EOS PCT: 1 % (ref 0–5)
HCT: 45.6 % (ref 39.0–52.0)
HEMOGLOBIN: 14.9 g/dL (ref 13.0–17.0)
LYMPHS ABS: 1.8 10*3/uL (ref 0.7–4.0)
LYMPHS PCT: 28 % (ref 12–46)
MCH: 31.2 pg (ref 26.0–34.0)
MCHC: 32.7 g/dL (ref 30.0–36.0)
MCV: 95.4 fL (ref 78.0–100.0)
MONOS PCT: 9 % (ref 3–12)
MPV: 9.3 fL (ref 8.6–12.4)
Monocytes Absolute: 0.6 10*3/uL (ref 0.1–1.0)
Neutro Abs: 3.8 10*3/uL (ref 1.7–7.7)
Neutrophils Relative %: 61 % (ref 43–77)
PLATELETS: 354 10*3/uL (ref 150–400)
RBC: 4.78 MIL/uL (ref 4.22–5.81)
RDW: 12.5 % (ref 11.5–15.5)
WBC: 6.3 10*3/uL (ref 4.0–10.5)

## 2015-09-16 MED ORDER — ABACAVIR-DOLUTEGRAVIR-LAMIVUD 600-50-300 MG PO TABS: 1.0000 | ORAL_TABLET | Freq: Every day | ORAL | Status: DC

## 2015-09-16 NOTE — Progress Notes (Signed)
Subjective:  Chief complaint: Follow-up for HIV on medications   Patient ID: Cody Barton, male    DOB: September 10, 1975, 40 y.o.   MRN: [id number]  HPI   40 year-old African-American with HIV infection acquired infection in whom we had started Complera and who has had perfect virological suppression. He admitted that he sometimes was taking the complera on an empty stomach which I said was absolutely a bad idea and we  switched him to [facility] and he has maintained perfect virological suppression since then.  In July he was found to have gonorrhea we tested him via oral swab and we treated him for this. We do plan on testing and again from oropharynx rectum and urine. He states that he is always a top and that he would not build to have gonorrhea rectally but we will check him this one time at least.  Lab Results  Component Value Date   HIV1RNAQUANT <20 02/18/2015   Lab Results  Component Value Date   CD4TABS 580 02/18/2015   CD4TABS 650 11/06/2014   CD4TABS 530 09/03/2014   Past Medical History  Diagnosis Date  . Gonorrhea   . HIV (human immunodeficiency virus infection) (HCC)   . Otitis media 12/16/2014  . Seasonal allergies 12/16/2014  . Sore throat 12/16/2014    No past surgical history on file.  Family History  Problem Relation Age of Onset  . Hypertension Father   . Cancer Maternal Grandfather       Social History   Social History  . Marital Status: Single    Spouse Name: N/A  . Number of Children: N/A  . Years of Education: N/A   Social History Main Topics  . Smoking status: Light Tobacco Smoker -- 0.30 packs/day for 1.5 years    Types: Cigarettes  . Smokeless tobacco: Never Used     Comment:  4-6 day  . Alcohol Use: 0.0 oz/week    0 Standard drinks or equivalent per week     Comment:    weekend  . Drug Use: 3.00 per week    Special: Marijuana     Comment: daily use   . Sexual Activity:    Partners: Male    Pharmacist, hospital Protection: None   Other  Topics Concern  . None   Social History Narrative   ** Merged History Encounter **       ** Merged History Encounter **        No Known Allergies   Current outpatient prescriptions:  .  abacavir-dolutegravir-lamiVUDine (TRIUMEQ) 600-50-300 MG tablet, Take 1 tablet by mouth daily., Disp: 30 tablet, Rfl: 11 .  Multiple Vitamins-Minerals (MULTI-VITAMIN GUMMIES PO), Take 1 tablet by mouth daily., Disp: , Rfl:    Review of Systems  Constitutional: Negative for fever, chills, diaphoresis, activity change, appetite change, fatigue and unexpected weight change.  HENT: Negative for congestion, rhinorrhea, sinus pressure, sneezing, sore throat and trouble swallowing.   Eyes: Negative for photophobia and visual disturbance.  Respiratory: Negative for cough, chest tightness, shortness of breath, wheezing and stridor.   Cardiovascular: Negative for chest pain, palpitations and leg swelling.  Gastrointestinal: Negative for nausea, vomiting, abdominal pain, diarrhea, constipation, blood in stool, abdominal distention and anal bleeding.  Genitourinary: Negative for dysuria, hematuria, flank pain and difficulty urinating.  Musculoskeletal: Negative for myalgias, back pain, joint swelling and gait problem.  Skin: Negative for color change, pallor, rash and wound.  Neurological: Negative for dizziness, tremors, weakness and light-headedness.  Hematological: Negative for adenopathy. Does  not bruise/bleed easily.  Psychiatric/Behavioral: Negative for behavioral problems, confusion, sleep disturbance, dysphoric mood, decreased concentration and agitation.       Objective:   Physical Exam  Constitutional: He is oriented to person, place, and time. He appears well-nourished. No distress.  HENT:  Head: Normocephalic and atraumatic.  Right Ear: No drainage, swelling or tenderness. No decreased hearing is noted.  Left Ear: No drainage, swelling or tenderness. No decreased hearing is noted.   Mouth/Throat: Oropharynx is clear and moist. No oropharyngeal exudate.  Eyes: Conjunctivae and EOM are normal. No scleral icterus.  Neck: Normal range of motion. Neck supple.  Cardiovascular: Normal rate, regular rhythm and normal heart sounds.  Exam reveals no gallop and no friction rub.   No murmur heard. Pulmonary/Chest: Effort normal and breath sounds normal. No respiratory distress. He has no wheezes. He has no rales. He exhibits no tenderness.  Abdominal: He exhibits no distension and no mass. There is no tenderness. There is no rebound and no guarding.  Musculoskeletal: He exhibits no edema or tenderness.       Right shoulder: He exhibits normal range of motion, no tenderness and no bony tenderness.  Neurological: He is alert and oriented to person, place, and time. He exhibits normal muscle tone. Coordination normal.  Skin: Skin is warm and dry. He is not diaphoretic.  Psychiatric: He has a normal mood and affect. His behavior is normal. Judgment and thought content normal.          Assessment & Plan:   HIV: perfectly suppressed renew ADAP today and check labs   HTN: likely need reinstuttion of blood pressure agent if this continues, he says he is nervous when he comes to clinic   Smoker: counselled to quit . He states he was able to go from normal [place] to vaporize cigarettes but now is back on normal cigarettes again.  Gonorrhea check oropharynx urine and and rectum

## 2015-09-17 ENCOUNTER — Telehealth: Payer: Self-pay | Admitting: *Deleted

## 2015-09-17 LAB — RPR

## 2015-09-17 LAB — T-HELPER CELL (CD4) - (RCID CLINIC ONLY)
CD4 % Helper T Cell: 31 % — ABNORMAL LOW (ref 33–55)
CD4 T Cell Abs: 570 /uL (ref 400–2700)

## 2015-09-17 LAB — CYTOLOGY, (ORAL, ANAL, URETHRAL) ANCILLARY ONLY
CHLAMYDIA, DNA PROBE: NEGATIVE
CHLAMYDIA, DNA PROBE: NEGATIVE
NEISSERIA GONORRHEA: NEGATIVE
Neisseria Gonorrhea: NEGATIVE

## 2015-09-17 LAB — URINE CYTOLOGY ANCILLARY ONLY
Chlamydia: POSITIVE — AB
NEISSERIA GONORRHEA: NEGATIVE

## 2015-09-17 NOTE — Telephone Encounter (Signed)
-----   Message from Randall Hiss, MD sent at 09/17/2015 11:14 AM EST ----- Lonia Farber needs to be treated for chlamydia with 1 gram of azithromycin vs 7 days of doxycycline  bid. Partners need to be tested and treatd

## 2015-09-17 NOTE — Telephone Encounter (Signed)
Left generic message on voice mail asking patient to call for lab results. Andree Coss, RN

## 2015-09-18 NOTE — Telephone Encounter (Signed)
Thanks Michelle

## 2015-09-18 NOTE — Telephone Encounter (Signed)
Spoke with patient.  He will come Monday for 1 gm azithromycin.  RN advised patient that he should not have any sexual encounters until after treatment.  Patient is reminded to use condoms every time.  Patient Is advised he needs to notify his partners that they should be tested and treated.  Patient verbalized understanding.  Appointment made. Andree Coss, RN

## 2015-09-20 LAB — HIV RNA, RTPCR W/R GT (RTI, PI,INT)

## 2015-09-22 ENCOUNTER — Ambulatory Visit (INDEPENDENT_AMBULATORY_CARE_PROVIDER_SITE_OTHER): Payer: Self-pay | Admitting: *Deleted

## 2015-09-22 DIAGNOSIS — A749 Chlamydial infection, unspecified: Secondary | ICD-10-CM

## 2015-09-22 MED ORDER — AZITHROMYCIN 250 MG PO TABS
1000.0000 mg | ORAL_TABLET | Freq: Once | ORAL | Status: AC
  Administered 2015-09-22: 1000 mg via ORAL

## 2016-03-01 ENCOUNTER — Other Ambulatory Visit (INDEPENDENT_AMBULATORY_CARE_PROVIDER_SITE_OTHER): Payer: Self-pay

## 2016-03-01 DIAGNOSIS — Z79899 Other long term (current) drug therapy: Secondary | ICD-10-CM

## 2016-03-01 DIAGNOSIS — B20 Human immunodeficiency virus [HIV] disease: Secondary | ICD-10-CM

## 2016-03-01 DIAGNOSIS — Z113 Encounter for screening for infections with a predominantly sexual mode of transmission: Secondary | ICD-10-CM

## 2016-03-02 ENCOUNTER — Telehealth: Payer: Self-pay | Admitting: *Deleted

## 2016-03-02 LAB — COMPLETE METABOLIC PANEL WITH GFR
ALT: 16 U/L (ref 9–46)
AST: 20 U/L (ref 10–40)
Albumin: 4 g/dL (ref 3.6–5.1)
Alkaline Phosphatase: 85 U/L (ref 40–115)
BILIRUBIN TOTAL: 1 mg/dL (ref 0.2–1.2)
BUN: 9 mg/dL (ref 7–25)
CHLORIDE: 105 mmol/L (ref 98–110)
CO2: 24 mmol/L (ref 20–31)
CREATININE: 1.1 mg/dL (ref 0.60–1.35)
Calcium: 9.6 mg/dL (ref 8.6–10.3)
GFR, Est African American: >89 mL/min (ref >=60)
GFR, Est Non African American: 85 mL/min (ref >=60)
Glucose, Bld: 112 mg/dL — ABNORMAL HIGH (ref 65–99)
Potassium: 4.2 mmol/L (ref 3.5–5.3)
Sodium: 139 mmol/L (ref 135–146)
Total Protein: 7.2 g/dL (ref 6.1–8.1)

## 2016-03-02 LAB — CBC WITH DIFFERENTIAL/PLATELET
Basophils Absolute: 56 cells/uL (ref 0–200)
Basophils Relative: 1 %
Eosinophils Absolute: 168 cells/uL (ref 15–500)
Eosinophils Relative: 3 %
HCT: 47.2 % (ref 38.5–50.0)
HEMOGLOBIN: 15.3 g/dL (ref 13.2–17.1)
LYMPHS PCT: 25 %
Lymphs Abs: 1400 cells/uL (ref 850–3900)
MCH: 31.2 pg (ref 27.0–33.0)
MCHC: 32.4 g/dL (ref 32.0–36.0)
MCV: 96.3 fL (ref 80.0–100.0)
MONO ABS: 560 {cells}/uL (ref 200–950)
MPV: 9.7 fL (ref 7.5–12.5)
Monocytes Relative: 10 %
NEUTROS PCT: 61 %
Neutro Abs: 3416 cells/uL (ref 1500–7800)
Platelets: 332 10*3/uL (ref 140–400)
RBC: 4.9 MIL/uL (ref 4.20–5.80)
RDW: 12.6 % (ref 11.0–15.0)
WBC: 5.6 10*3/uL (ref 3.8–10.8)

## 2016-03-02 LAB — LIPID PANEL
CHOL/HDL RATIO: 2.7 ratio (ref <=5.0)
Cholesterol: 144 mg/dL (ref 125–200)
HDL: 53 mg/dL (ref >=40)
LDL Cholesterol: 72 mg/dL (ref <130)
Triglycerides: 97 mg/dL (ref <150)
VLDL: 19 mg/dL (ref <30)

## 2016-03-02 LAB — URINE CYTOLOGY ANCILLARY ONLY
Chlamydia: NEGATIVE
NEISSERIA GONORRHEA: NEGATIVE

## 2016-03-02 LAB — HIV-1 RNA QUANT-NO REFLEX-BLD: HIV-1 RNA Quant, Log: <1.3 Log copies/mL (ref <1.30)

## 2016-03-02 LAB — T-HELPER CELL (CD4) - (RCID CLINIC ONLY)
CD4 T CELL HELPER: 34 % (ref 33–55)
CD4 T Cell Abs: 520 /uL (ref 400–2700)

## 2016-03-02 LAB — RPR: RPR Ser Ql: REACTIVE — AB

## 2016-03-02 LAB — MICROALBUMIN / CREATININE URINE RATIO
Creatinine, Urine: 342 mg/dL (ref 20–370)
Microalb Creat Ratio: 4 mcg/mg creat (ref <30)
Microalb, Ur: 1.2 mg/dL

## 2016-03-02 LAB — FLUORESCENT TREPONEMAL AB(FTA)-IGG-BLD: Fluorescent Treponemal ABS: REACTIVE — AB

## 2016-03-02 LAB — RPR TITER: RPR Titer: 1:1 {titer}

## 2016-03-03 ENCOUNTER — Encounter: Payer: Self-pay | Admitting: Infectious Disease

## 2016-03-03 NOTE — Telephone Encounter (Signed)
Lets give him IM PCN 2.4 MU x1

## 2016-03-03 NOTE — Telephone Encounter (Signed)
-----   Message from Randall Hissornelius N Van Dam, MD sent at 03/02/2016  8:55 AM EDT ----- Was Cody Farberonstantine treated for Syphilis recently his titer is 1:1? Maybe he had it in the past. I know the [facility] will probably insist on us treating even though this may be a reversion to low titer from prior treatment

## 2016-03-03 NOTE — Telephone Encounter (Signed)
-----   Message from Randall Hissornelius N Van Dam, MD sent at 03/02/2016 10:54 PM EDT ----- I doubt it is new but lets give him PCN IM at least once  ----- Message -----    From: Lurlean Leydenravis F Poole, CMA    Sent: 03/02/2016   9:39 AM      To: Randall Hissornelius N Van Dam, MD  Dr Daiva EvesVan Dam,  As far back as 08/2012 the patient has had several RPR test and none has been reactive. So do you think this is new and do you want to give him treatment? Please advise.  Feliz Beamravis

## 2016-03-03 NOTE — Telephone Encounter (Signed)
Spoke to patient and he does not recall ever being treated for syphilis. Please advise Cody Barton

## 2016-03-04 ENCOUNTER — Ambulatory Visit (INDEPENDENT_AMBULATORY_CARE_PROVIDER_SITE_OTHER): Payer: Self-pay | Admitting: *Deleted

## 2016-03-04 DIAGNOSIS — A539 Syphilis, unspecified: Secondary | ICD-10-CM

## 2016-03-04 MED ORDER — PENICILLIN G BENZATHINE 1200000 UNIT/2ML IM SUSP
1.2000 10*6.[IU] | Freq: Once | INTRAMUSCULAR | Status: AC
  Administered 2016-03-04: 1.2 10*6.[IU] via INTRAMUSCULAR

## 2016-03-04 NOTE — Telephone Encounter (Signed)
Patient stated he would come today (this AM) for the injection. Added to nurse schedule. Cody Barton

## 2016-03-04 NOTE — Telephone Encounter (Signed)
Perfect

## 2016-03-14 ENCOUNTER — Ambulatory Visit: Payer: Self-pay | Admitting: Infectious Disease

## 2016-03-16 ENCOUNTER — Telehealth: Payer: Self-pay | Admitting: *Deleted

## 2016-03-16 NOTE — Telephone Encounter (Signed)
RN reached the patient and made him a new appointment with Dr. Daiva Eves for 04/19/16.

## 2016-04-19 ENCOUNTER — Encounter: Payer: Self-pay | Admitting: Infectious Disease

## 2016-04-19 ENCOUNTER — Ambulatory Visit (INDEPENDENT_AMBULATORY_CARE_PROVIDER_SITE_OTHER): Payer: Self-pay | Admitting: Infectious Disease

## 2016-04-19 ENCOUNTER — Ambulatory Visit: Payer: Self-pay | Admitting: *Deleted

## 2016-04-19 VITALS — BP 143/93 | HR 79 | Temp 98.1°F | Ht 73.0 in | Wt 203.0 lb

## 2016-04-19 DIAGNOSIS — A549 Gonococcal infection, unspecified: Secondary | ICD-10-CM

## 2016-04-19 DIAGNOSIS — B20 Human immunodeficiency virus [HIV] disease: Secondary | ICD-10-CM

## 2016-04-19 DIAGNOSIS — F320 Major depressive disorder, single episode, mild: Secondary | ICD-10-CM

## 2016-04-19 DIAGNOSIS — F329 Major depressive disorder, single episode, unspecified: Secondary | ICD-10-CM

## 2016-04-19 DIAGNOSIS — I10 Essential (primary) hypertension: Secondary | ICD-10-CM

## 2016-04-19 DIAGNOSIS — Z23 Encounter for immunization: Secondary | ICD-10-CM

## 2016-04-19 HISTORY — DX: Major depressive disorder, single episode, unspecified: F32.9

## 2016-04-19 NOTE — BH Specialist Note (Signed)
Counselor met with Cody Barton today as a warm hand off in the exam room.  Patient had scored a 3 on his PHQ 9 and nursing asked counselor to follow up with him.  Patient was oriented times four with good affect and dress.  Patient was alert and shared freely.  Patient tended to ask questions about getting a job and helping with housing.  Counselor communicated to patient that those needs would fall more under the direction of [facility].  Counselor educated patient about counseling services and asked if he felt he had a need.  Patient said yes that it might be good to talk to someone outside his circle.  Counselor recommended that patient make an appointment when he checked out today.    Rolena Infante, MA, LPC [facility]

## 2016-04-19 NOTE — Progress Notes (Signed)
Subjective:   Chief complaint:depressive ssx and follow-up for HIV on medications   Patient ID: Cody Barton, male    DOB: 03/10/1976, 40 y.o.   MRN: [id number]  HPI  40 year-old African-American with HIV infection acquired infection in whom we had started Complera and who has had perfect virological suppression. He admitted that he sometimes was taking the complera on an empty stomach which I said was absolutely a bad idea and we  switched him to [facility] and he has maintained perfect virological suppression since then.  In July he was found to have gonorrhea we tested him via oral swab and we treated him for this. We do plan on testing and again from oropharynx rectum and urine.   Today he endorses some depressive ssx related to trying to find a job and trying to find housing for himself--he is working with [facility] on that front.  He denies SI or HI.  Past Medical History:  Diagnosis Date  . Gonorrhea   . HIV (human immunodeficiency virus infection) (HCC)   . Otitis media 12/16/2014  . Seasonal allergies 12/16/2014  . Sore throat 12/16/2014    No past surgical history on file.  Family History  Problem Relation Age of Onset  . Hypertension Father   . Cancer Maternal Grandfather       Social History   Social History  . Marital status: Single    Spouse name: N/A  . Number of children: N/A  . Years of education: N/A   Social History Main Topics  . Smoking status: Light Tobacco Smoker    Packs/day: 0.30    Years: 1.50    Types: Cigarettes  . Smokeless tobacco: Never Used     Comment:  4-6 day  . Alcohol use 0.0 oz/week     Comment:    weekend  . Drug use:     Frequency: 3.0 times per week    Types: Marijuana     Comment: daily use   . Sexual activity: Yes    Partners: Male    Birth control/ protection: None   Other Topics Concern  . None   Social History Narrative   ** Merged History Encounter **       ** Merged History Encounter **        No Known  Allergies   Current Outpatient Prescriptions:  .  abacavir-dolutegravir-lamiVUDine (TRIUMEQ) 600-50-300 MG tablet, Take 1 tablet by mouth daily., Disp: 30 tablet, Rfl: 11 .  Multiple Vitamins-Minerals (MULTI-VITAMIN GUMMIES PO), Take 1 tablet by mouth daily., Disp: , Rfl:   Lab Results  Component Value Date   HIV1RNAQUANT <20 03/01/2016   HIV1RNAQUANT <20 09/16/2015   HIV1RNAQUANT <20 02/18/2015      Lab Results  Component Value Date   CD4TABS 520 03/01/2016   CD4TABS 570 09/16/2015   CD4TABS 580 02/18/2015   Past Medical History:  Diagnosis Date  . Gonorrhea   . HIV (human immunodeficiency virus infection) (HCC)   . Otitis media 12/16/2014  . Seasonal allergies 12/16/2014  . Sore throat 12/16/2014    No past surgical history on file.  Family History  Problem Relation Age of Onset  . Hypertension Father   . Cancer Maternal Grandfather       Social History   Social History  . Marital status: Single    Spouse name: N/A  . Number of children: N/A  . Years of education: N/A   Social History Main Topics  . Smoking status: Light Tobacco Smoker  Packs/day: 0.30    Years: 1.50    Types: Cigarettes  . Smokeless tobacco: Never Used     Comment:  4-6 day  . Alcohol use 0.0 oz/week     Comment:    weekend  . Drug use:     Frequency: 3.0 times per week    Types: Marijuana     Comment: daily use   . Sexual activity: Yes    Partners: Male    Birth control/ protection: None   Other Topics Concern  . None   Social History Narrative   ** Merged History Encounter **       ** Merged History Encounter **        No Known Allergies   Current Outpatient Prescriptions:  .  abacavir-dolutegravir-lamiVUDine (TRIUMEQ) 600-50-300 MG tablet, Take 1 tablet by mouth daily., Disp: 30 tablet, Rfl: 11 .  Multiple Vitamins-Minerals (MULTI-VITAMIN GUMMIES PO), Take 1 tablet by mouth daily., Disp: , Rfl:    Review of Systems  Constitutional: Negative for activity change,  appetite change, chills, diaphoresis, fatigue, fever and unexpected weight change.  HENT: Negative for congestion, rhinorrhea, sinus pressure, sneezing, sore throat and trouble swallowing.   Eyes: Negative for photophobia and visual disturbance.  Respiratory: Negative for cough, chest tightness, shortness of breath, wheezing and stridor.   Cardiovascular: Negative for chest pain, palpitations and leg swelling.  Gastrointestinal: Negative for abdominal distention, abdominal pain, anal bleeding, blood in stool, constipation, diarrhea, nausea and vomiting.  Genitourinary: Negative for difficulty urinating, dysuria, flank pain and hematuria.  Musculoskeletal: Negative for back pain, gait problem, joint swelling and myalgias.  Skin: Negative for color change, pallor, rash and wound.  Neurological: Negative for dizziness, tremors, weakness and light-headedness.  Hematological: Negative for adenopathy. Does not bruise/bleed easily.  Psychiatric/Behavioral: Negative for agitation, behavioral problems, confusion, decreased concentration, dysphoric mood and sleep disturbance.       Objective:   Physical Exam  Constitutional: He is oriented to person, place, and time. He appears well-nourished. No distress.  HENT:  Head: Normocephalic and atraumatic.  Right Ear: No drainage, swelling or tenderness. No decreased hearing is noted.  Left Ear: No drainage, swelling or tenderness. No decreased hearing is noted.  Mouth/Throat: Oropharynx is clear and moist. No oropharyngeal exudate.  Eyes: Conjunctivae and EOM are normal. No scleral icterus.  Neck: Normal range of motion. Neck supple.  Cardiovascular: Normal rate, regular rhythm and normal heart sounds.  Exam reveals no gallop and no friction rub.   No murmur heard. Pulmonary/Chest: Effort normal and breath sounds normal. No respiratory distress. He has no wheezes. He has no rales. He exhibits no tenderness.  Abdominal: He exhibits no distension and no  mass. There is no tenderness. There is no rebound and no guarding.  Musculoskeletal: He exhibits no edema or tenderness.       Right shoulder: He exhibits normal range of motion, no tenderness and no bony tenderness.  Neurological: He is alert and oriented to person, place, and time. He exhibits normal muscle tone. Coordination normal.  Skin: Skin is warm and dry. He is not diaphoretic.  Psychiatric: He has a normal mood and affect. His behavior is normal. Judgment and thought content normal.          Assessment & Plan:   HIV: perfectly suppressed renew ADAP today and RTC in 5 months. He endorses missing up to 4 pills per month. Asked him to further tighten his adherence   HTN: BP is still a bit up and  will likely need to add something  Vitals:   04/19/16 1457  BP: (!) 143/93  Pulse: 79  Temp: 98.1 F (36.7 C)     Gonorrhea, chlamydia and syphilis hx: recheck OP and rectum for GC and chlamydia  Depression: he denied SI, HI. He is contracted for safety. He will meet with with Lennox LaityJodi today  I spent greater than 40 minutes with the patient including greater than 50% of time in face to face counsel of the patient re his depressive ssx, adherence to his ARV meds, his HTN, and his STI's and in coordination of his care.

## 2016-04-21 LAB — CYTOLOGY, (ORAL, ANAL, URETHRAL) ANCILLARY ONLY
CHLAMYDIA, DNA PROBE: NEGATIVE
Chlamydia: NEGATIVE
NEISSERIA GONORRHEA: NEGATIVE
Neisseria Gonorrhea: NEGATIVE

## 2016-08-29 ENCOUNTER — Other Ambulatory Visit (INDEPENDENT_AMBULATORY_CARE_PROVIDER_SITE_OTHER): Payer: Self-pay

## 2016-08-29 DIAGNOSIS — Z113 Encounter for screening for infections with a predominantly sexual mode of transmission: Secondary | ICD-10-CM

## 2016-08-29 DIAGNOSIS — B20 Human immunodeficiency virus [HIV] disease: Secondary | ICD-10-CM

## 2016-08-29 DIAGNOSIS — Z79899 Other long term (current) drug therapy: Secondary | ICD-10-CM

## 2016-08-29 LAB — CBC WITH DIFFERENTIAL/PLATELET
Basophils Absolute: 0 cells/uL (ref 0–200)
Basophils Relative: 0 %
EOS ABS: 118 {cells}/uL (ref 15–500)
Eosinophils Relative: 1 %
HCT: 43.4 % (ref 38.5–50.0)
HEMOGLOBIN: 14.3 g/dL (ref 13.2–17.1)
LYMPHS ABS: 2006 {cells}/uL (ref 850–3900)
Lymphocytes Relative: 17 %
MCH: 31.6 pg (ref 27.0–33.0)
MCHC: 32.9 g/dL (ref 32.0–36.0)
MCV: 95.8 fL (ref 80.0–100.0)
MONOS PCT: 8 %
MPV: 9.6 fL (ref 7.5–12.5)
Monocytes Absolute: 944 cells/uL (ref 200–950)
NEUTROS ABS: 8732 {cells}/uL — AB (ref 1500–7800)
Neutrophils Relative %: 74 %
PLATELETS: 304 10*3/uL (ref 140–400)
RBC: 4.53 MIL/uL (ref 4.20–5.80)
RDW: 12.3 % (ref 11.0–15.0)
WBC: 11.8 10*3/uL — ABNORMAL HIGH (ref 3.8–10.8)

## 2016-08-29 LAB — COMPLETE METABOLIC PANEL WITH GFR
ALT: 21 U/L (ref 9–46)
AST: 20 U/L (ref 10–40)
Albumin: 3.8 g/dL (ref 3.6–5.1)
Alkaline Phosphatase: 80 U/L (ref 40–115)
BILIRUBIN TOTAL: 1 mg/dL (ref 0.2–1.2)
BUN: 15 mg/dL (ref 7–25)
CO2: 30 mmol/L (ref 20–31)
CREATININE: 1.19 mg/dL (ref 0.60–1.35)
Calcium: 9.7 mg/dL (ref 8.6–10.3)
Chloride: 105 mmol/L (ref 98–110)
GFR, EST AFRICAN AMERICAN: 88 mL/min (ref >=60)
GFR, Est Non African American: 76 mL/min (ref >=60)
GLUCOSE: 91 mg/dL (ref 65–99)
Potassium: 4.1 mmol/L (ref 3.5–5.3)
Sodium: 141 mmol/L (ref 135–146)
TOTAL PROTEIN: 7.6 g/dL (ref 6.1–8.1)

## 2016-08-29 LAB — LIPID PANEL
CHOLESTEROL: 141 mg/dL (ref <200)
HDL: 54 mg/dL (ref >40)
LDL CALC: 62 mg/dL (ref <100)
Total CHOL/HDL Ratio: 2.6 Ratio (ref <5.0)
Triglycerides: 126 mg/dL (ref <150)
VLDL: 25 mg/dL (ref <30)

## 2016-08-30 LAB — RPR

## 2016-08-30 LAB — T-HELPER CELL (CD4) - (RCID CLINIC ONLY)
CD4 % Helper T Cell: 26 % — ABNORMAL LOW (ref 33–55)
CD4 T Cell Abs: 460 /uL (ref 400–2700)

## 2016-08-31 LAB — HIV-1 RNA QUANT-NO REFLEX-BLD
HIV 1 RNA Quant: <20 copies/mL (ref <20)
HIV-1 RNA Quant, Log: <1.3 Log copies/mL (ref <1.30)

## 2016-09-09 ENCOUNTER — Telehealth: Payer: Self-pay | Admitting: Infectious Disease

## 2016-09-09 NOTE — Telephone Encounter (Signed)
Tried  To leave message but his VM is full re not coming to clinic on Monday

## 2016-09-12 ENCOUNTER — Ambulatory Visit: Payer: Self-pay | Admitting: Infectious Disease

## 2016-09-22 ENCOUNTER — Telehealth: Payer: Self-pay | Admitting: *Deleted

## 2016-09-22 NOTE — Telephone Encounter (Signed)
Pt left message in triage. RN attempted to call back, voicemail full. Andree CossHowell, Jaedan Schuman M, RN

## 2016-09-23 ENCOUNTER — Other Ambulatory Visit: Payer: Self-pay | Admitting: Infectious Disease

## 2016-09-23 DIAGNOSIS — B20 Human immunodeficiency virus [HIV] disease: Secondary | ICD-10-CM

## 2016-10-21 ENCOUNTER — Ambulatory Visit (HOSPITAL_COMMUNITY)
Admission: EM | Admit: 2016-10-21 | Discharge: 2016-10-21 | Disposition: A | Discharge status: Home / Self Care | Payer: Self-pay | Attending: Family Medicine | Admitting: Family Medicine

## 2016-10-21 ENCOUNTER — Encounter (HOSPITAL_COMMUNITY): Payer: Self-pay | Admitting: Emergency Medicine

## 2016-10-21 DIAGNOSIS — R197 Diarrhea, unspecified: Secondary | ICD-10-CM

## 2016-10-21 DIAGNOSIS — R110 Nausea: Secondary | ICD-10-CM

## 2016-10-21 DIAGNOSIS — A084 Viral intestinal infection, unspecified: Secondary | ICD-10-CM

## 2016-10-21 MED ORDER — ONDANSETRON 4 MG PO TBDP
ORAL_TABLET | ORAL | Status: AC
  Filled 2016-10-21: qty 1

## 2016-10-21 MED ORDER — ONDANSETRON 4 MG PO TBDP
4.0000 mg | ORAL_TABLET | Freq: Once | ORAL | Status: AC
  Administered 2016-10-21: 4 mg via ORAL

## 2016-10-21 MED ORDER — ONDANSETRON 8 MG PO TBDP: 8.0000 mg | ORAL_TABLET | Freq: Three times a day (TID) | ORAL | 0 refills | Status: DC | PRN

## 2016-10-21 NOTE — ED Triage Notes (Signed)
Pt stated last night vomiting and diarrhea, not able to keep any fluids down. 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

## 2016-10-21 NOTE — ED Provider Notes (Signed)
CSN: [id number]     Arrival date & time 10/21/16  1730 History   First MD Initiated Contact with Patient 10/21/16 1751     Chief Complaint  Patient presents with  . Nausea   (Consider location/radiation/quality/duration/timing/severity/associated sxs/prior Treatment) Patient is c/o NVD since last night.     The history is provided by the patient.  Emesis  Severity:  Moderate Duration:  1 day Timing:  Constant Quality:  Stomach contents Able to tolerate:  Liquids How soon after eating does vomiting occur:  2 minutes Progression:  Worsening Chronicity:  New Recent urination:  Normal Relieved by:  Nothing Worsened by:  Nothing Ineffective treatments:  None tried Associated symptoms: diarrhea     Past Medical History:  Diagnosis Date  . Gonorrhea   . HIV (human immunodeficiency virus infection) (HCC)   . Major depressive disorder, single episode 04/19/2016  . Otitis media 12/16/2014  . Seasonal allergies 12/16/2014  . Sore throat 12/16/2014   History reviewed. No pertinent surgical history. Family History  Problem Relation Age of Onset  . Hypertension Father   . Cancer Maternal Grandfather    Social History  Substance Use Topics  . Smoking status: Light Tobacco Smoker    Packs/day: 0.30    Years: 1.50    Types: Cigarettes  . Smokeless tobacco: Never Used     Comment:  4-6 day  . Alcohol use 0.0 oz/week     Comment:    weekend    Review of Systems  Constitutional: Positive for fatigue.  HENT: Negative.   Eyes: Negative.   Respiratory: Negative.   Cardiovascular: Negative.   Gastrointestinal: Positive for diarrhea, nausea and vomiting.  Endocrine: Negative.   Genitourinary: Negative.   Musculoskeletal: Negative.   Skin: Negative.   Allergic/Immunologic: Negative.   Neurological: Negative.   Hematological: Negative.   Psychiatric/Behavioral: Negative.     Allergies  Patient has no known allergies.  Home Medications   Prior to Admission medications    Medication Sig Start Date End Date Taking? Authorizing Provider  Multiple Vitamins-Minerals (MULTI-VITAMIN GUMMIES PO) Take 1 tablet by mouth daily.   Yes Historical Provider, MD  TRIUMEQ 600-50-300 MG tablet TAKE 1 TABLET BY MOUTH DAILY 09/23/16  Yes Randall Hissornelius N Van Dam, MD  ondansetron (ZOFRAN ODT) 8 MG disintegrating tablet Take 1 tablet (8 mg total) by mouth every 8 (eight) hours as needed for nausea or vomiting. 10/21/16   Deatra CanterWilliam J Leonce Bale, FNP   Meds Ordered and Administered this Visit   Medications  ondansetron (ZOFRAN-ODT) disintegrating tablet 4 mg (not administered)    BP 145/85 (BP Location: Left Arm)   Pulse 91   Temp 100.6 F (38.1 C) (Oral)   Resp 20  No data found.   Physical Exam  Constitutional: He appears well-developed and well-nourished.  HENT:  Head: Normocephalic and atraumatic.  Eyes: Conjunctivae and EOM are normal. Pupils are equal, round, and reactive to light.  Neck: Neck supple.  Cardiovascular: Normal rate, regular rhythm and normal heart sounds.   Pulmonary/Chest: Effort normal and breath sounds normal.  Abdominal: Soft. Bowel sounds are normal.  Nursing note and vitals reviewed.   Urgent Care Course     Procedures (including critical care time)  Labs Review Labs Reviewed - No data to display  Imaging Review No results found.   Visual Acuity Review  Right Eye Distance:   Left Eye Distance:   Bilateral Distance:    Right Eye Near:   Left Eye Near:    Bilateral  Near:         MDM   1. Viral gastroenteritis   2. Nausea   3. Diarrhea, unspecified type    Zofran ODT 4mg  one po now Zofran ODT 8mg  one po tid prn #20  Push po fluids, rest, tylenol and motrin otc prn as directed for fever, arthralgias, and myalgias.  Follow up prn if sx's continue or persist.    Deatra Canter, FNP 10/21/16 [phone]

## 2016-12-06 ENCOUNTER — Ambulatory Visit: Payer: Self-pay | Admitting: Infectious Disease

## 2017-01-18 ENCOUNTER — Ambulatory Visit: Payer: Self-pay

## 2017-01-18 ENCOUNTER — Other Ambulatory Visit: Payer: Self-pay

## 2017-01-19 ENCOUNTER — Ambulatory Visit: Payer: Self-pay

## 2017-01-19 ENCOUNTER — Ambulatory Visit (INDEPENDENT_AMBULATORY_CARE_PROVIDER_SITE_OTHER): Payer: Self-pay | Admitting: Pharmacist Clinician (PhC)/ Clinical Pharmacy Specialist

## 2017-01-19 DIAGNOSIS — B20 Human immunodeficiency virus [HIV] disease: Secondary | ICD-10-CM

## 2017-01-19 DIAGNOSIS — Z79899 Other long term (current) drug therapy: Secondary | ICD-10-CM

## 2017-01-19 DIAGNOSIS — Z113 Encounter for screening for infections with a predominantly sexual mode of transmission: Secondary | ICD-10-CM

## 2017-01-19 LAB — CBC WITH DIFFERENTIAL/PLATELET
BASOS PCT: 1 %
Basophils Absolute: 51 cells/uL (ref 0–200)
Eosinophils Absolute: 51 cells/uL (ref 15–500)
Eosinophils Relative: 1 %
HCT: 41.1 % (ref 38.5–50.0)
HEMOGLOBIN: 13.6 g/dL (ref 13.2–17.1)
LYMPHS ABS: 1836 {cells}/uL (ref 850–3900)
Lymphocytes Relative: 36 %
MCH: 31.1 pg (ref 27.0–33.0)
MCHC: 33.1 g/dL (ref 32.0–36.0)
MCV: 93.8 fL (ref 80.0–100.0)
MPV: 9.4 fL (ref 7.5–12.5)
Monocytes Absolute: 459 cells/uL (ref 200–950)
Monocytes Relative: 9 %
NEUTROS ABS: 2703 {cells}/uL (ref 1500–7800)
Neutrophils Relative %: 53 %
Platelets: 286 10*3/uL (ref 140–400)
RBC: 4.38 MIL/uL (ref 4.20–5.80)
RDW: 12.3 % (ref 11.0–15.0)
WBC: 5.1 10*3/uL (ref 3.8–10.8)

## 2017-01-19 LAB — COMPLETE METABOLIC PANEL WITH GFR
ALBUMIN: 4.1 g/dL (ref 3.6–5.1)
ALK PHOS: 65 U/L (ref 40–115)
ALT: 21 U/L (ref 9–46)
AST: 20 U/L (ref 10–40)
BILIRUBIN TOTAL: 0.9 mg/dL (ref 0.2–1.2)
BUN: 15 mg/dL (ref 7–25)
CO2: 26 mmol/L (ref 20–31)
Calcium: 9.8 mg/dL (ref 8.6–10.3)
Chloride: 105 mmol/L (ref 98–110)
Creat: 0.97 mg/dL (ref 0.60–1.35)
GFR, Est African American: >89 mL/min (ref >=60)
GFR, Est Non African American: >89 mL/min (ref >=60)
Glucose, Bld: 94 mg/dL (ref 65–99)
Potassium: 4.1 mmol/L (ref 3.5–5.3)
Sodium: 139 mmol/L (ref 135–146)
TOTAL PROTEIN: 7.2 g/dL (ref 6.1–8.1)

## 2017-01-19 LAB — LIPID PANEL
CHOL/HDL RATIO: 3.1 ratio (ref <5.0)
Cholesterol: 138 mg/dL (ref <200)
HDL: 45 mg/dL (ref >40)
LDL CALC: 72 mg/dL (ref <100)
TRIGLYCERIDES: 106 mg/dL (ref <150)
VLDL: 21 mg/dL (ref <30)

## 2017-01-19 MED ORDER — ABACAVIR-DOLUTEGRAVIR-LAMIVUD 600-50-300 MG PO TABS: 1.0000 | ORAL_TABLET | Freq: Every day | ORAL | 2 refills | Status: DC
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
## 2017-01-20 LAB — T-HELPER CELL (CD4) - (RCID CLINIC ONLY)
CD4 % Helper T Cell: 37 % (ref 33–55)
CD4 T Cell Abs: 680 /uL (ref 400–2700)

## 2017-01-20 LAB — RPR

## 2017-01-23 ENCOUNTER — Encounter: Payer: Self-pay | Admitting: Infectious Disease

## 2017-01-23 LAB — URINE CYTOLOGY ANCILLARY ONLY
Chlamydia: NEGATIVE
Neisseria Gonorrhea: NEGATIVE

## 2017-02-21 ENCOUNTER — Encounter: Payer: Self-pay | Admitting: Infectious Disease

## 2017-02-21 ENCOUNTER — Telehealth: Payer: Self-pay

## 2017-02-21 ENCOUNTER — Ambulatory Visit (INDEPENDENT_AMBULATORY_CARE_PROVIDER_SITE_OTHER): Payer: Self-pay | Admitting: Infectious Disease

## 2017-02-21 VITALS — BP 161/92 | HR 76 | Temp 98.2°F | Ht 73.0 in | Wt 203.0 lb

## 2017-02-21 DIAGNOSIS — B20 Human immunodeficiency virus [HIV] disease: Secondary | ICD-10-CM

## 2017-02-21 DIAGNOSIS — Z23 Encounter for immunization: Secondary | ICD-10-CM

## 2017-02-21 DIAGNOSIS — A549 Gonococcal infection, unspecified: Secondary | ICD-10-CM

## 2017-02-21 DIAGNOSIS — J029 Acute pharyngitis, unspecified: Secondary | ICD-10-CM

## 2017-02-21 DIAGNOSIS — I10 Essential (primary) hypertension: Secondary | ICD-10-CM

## 2017-02-21 LAB — HIV RNA, RTPCR W/R GT (RTI, PI,INT)
HIV-1 RNA, QN PCR: <20 copies/mL
HIV-1 RNA, QN PCR: NOT DETECTED Log copies/mL

## 2017-02-21 MED ORDER — AMLODIPINE BESYLATE 10 MG PO TABS: 10.0000 mg | ORAL_TABLET | Freq: Every day | ORAL | 11 refills | Status: DC

## 2017-02-21 NOTE — Patient Instructions (Signed)
You should start on Norvasc (amlodipine) 10mg  daily  Please come back and see [facility] pharmacy in one month to check BP again and see if we need to add anything

## 2017-02-21 NOTE — Progress Notes (Signed)
Subjective:   Chief complaint:depressive ssx and follow-up for HIV on medications   Patient ID: Cody Barton, male    DOB: 1976/03/22, 41 y.o.   MRN: [id number]  HPI  41 year-old African-American with HIV infection acquired infection in whom we had started Complera and who has had perfect virological suppression. He admitted that he sometimes was taking the complera on an empty stomach which I said was absolutely a bad idea and we  switched him to [facility] and he has maintained perfect virological suppression since then.  Today he is in good spirits though he has obvious smell on his clothes of very recent marijuana smoking which I asked him about and he confirmed.  He has done his HMAP and I am recommeding he try to employ this program to obtain an ACA   He says he missed one week of ARV due to problems with HMAP not being done on time intiailly   Lab Results  Component Value Date   HIV1RNAQUANT <20 08/29/2016   HIV1RNAQUANT <20 03/01/2016   HIV1RNAQUANT <20 09/16/2015    Lab Results  Component Value Date   CD4TABS 680 01/19/2017   CD4TABS 460 08/29/2016   CD4TABS 520 03/01/2016       Past Medical History:  Diagnosis Date  . Gonorrhea   . HIV (human immunodeficiency virus infection) (HCC)   . Major depressive disorder, single episode 04/19/2016  . Otitis media 12/16/2014  . Seasonal allergies 12/16/2014  . Sore throat 12/16/2014    No past surgical history on file.  Family History  Problem Relation Age of Onset  . Hypertension Father   . Cancer Maternal Grandfather       Social History   Social History  . Marital status: Single    Spouse name: N/A  . Number of children: N/A  . Years of education: N/A   Social History Main Topics  . Smoking status: Light Tobacco Smoker    Packs/day: 0.30    Years: 1.50    Types: Cigarettes  . Smokeless tobacco: Never Used     Comment:  4-6 day  . Alcohol use 0.0 oz/week     Comment:    weekend  . Drug use: Yes     Frequency: 3.0 times per week    Types: Marijuana     Comment: daily use   . Sexual activity: Yes    Partners: Male    Birth control/ protection: None   Other Topics Concern  . None   Social History Narrative   ** Merged History Encounter **       ** Merged History Encounter **        No Known Allergies   Current Outpatient Prescriptions:  .  abacavir-dolutegravir-lamiVUDine (TRIUMEQ) 600-50-300 MG tablet, Take 1 tablet by mouth daily., Disp: 30 tablet, Rfl: 2 .  Multiple Vitamins-Minerals (MULTI-VITAMIN GUMMIES PO), Take 1 tablet by mouth daily., Disp: , Rfl:  .  ondansetron (ZOFRAN ODT) 8 MG disintegrating tablet, Take 1 tablet (8 mg total) by mouth every 8 (eight) hours as needed for nausea or vomiting. (Patient not taking: Reported on 01/19/2017), Disp: 20 tablet, Rfl: 0 .  TRIUMEQ 600-50-300 MG tablet, TAKE 1 TABLET BY MOUTH DAILY (Patient not taking: Reported on 02/21/2017), Disp: 30 tablet, Rfl: 5  Lab Results  Component Value Date   HIV1RNAQUANT <20 08/29/2016   HIV1RNAQUANT <20 03/01/2016   HIV1RNAQUANT <20 09/16/2015      Lab Results  Component Value Date   CD4TABS 680 01/19/2017  CD4TABS 460 08/29/2016   CD4TABS 520 03/01/2016   Past Medical History:  Diagnosis Date  . Gonorrhea   . HIV (human immunodeficiency virus infection) (HCC)   . Major depressive disorder, single episode 04/19/2016  . Otitis media 12/16/2014  . Seasonal allergies 12/16/2014  . Sore throat 12/16/2014    No past surgical history on file.  Family History  Problem Relation Age of Onset  . Hypertension Father   . Cancer Maternal Grandfather       Social History   Social History  . Marital status: Single    Spouse name: N/A  . Number of children: N/A  . Years of education: N/A   Social History Main Topics  . Smoking status: Light Tobacco Smoker    Packs/day: 0.30    Years: 1.50    Types: Cigarettes  . Smokeless tobacco: Never Used     Comment:  4-6 day  . Alcohol use  0.0 oz/week     Comment:    weekend  . Drug use: Yes    Frequency: 3.0 times per week    Types: Marijuana     Comment: daily use   . Sexual activity: Yes    Partners: Male    Birth control/ protection: None   Other Topics Concern  . None   Social History Narrative   ** Merged History Encounter **       ** Merged History Encounter **        No Known Allergies   Current Outpatient Prescriptions:  .  abacavir-dolutegravir-lamiVUDine (TRIUMEQ) 600-50-300 MG tablet, Take 1 tablet by mouth daily., Disp: 30 tablet, Rfl: 2 .  Multiple Vitamins-Minerals (MULTI-VITAMIN GUMMIES PO), Take 1 tablet by mouth daily., Disp: , Rfl:  .  ondansetron (ZOFRAN ODT) 8 MG disintegrating tablet, Take 1 tablet (8 mg total) by mouth every 8 (eight) hours as needed for nausea or vomiting. (Patient not taking: Reported on 01/19/2017), Disp: 20 tablet, Rfl: 0 .  TRIUMEQ 600-50-300 MG tablet, TAKE 1 TABLET BY MOUTH DAILY (Patient not taking: Reported on 02/21/2017), Disp: 30 tablet, Rfl: 5   Review of Systems  Constitutional: Negative for activity change, appetite change, chills, diaphoresis, fatigue, fever and unexpected weight change.  HENT: Negative for congestion, rhinorrhea, sinus pressure, sneezing, sore throat and trouble swallowing.   Eyes: Negative for photophobia and visual disturbance.  Respiratory: Negative for cough, chest tightness, shortness of breath, wheezing and stridor.   Cardiovascular: Negative for chest pain, palpitations and leg swelling.  Gastrointestinal: Negative for abdominal distention, abdominal pain, anal bleeding, constipation, diarrhea, nausea and vomiting.  Genitourinary: Negative for difficulty urinating, dysuria, flank pain and hematuria.  Musculoskeletal: Negative for back pain, gait problem, joint swelling and myalgias.  Skin: Negative for color change, pallor, rash and wound.  Neurological: Negative for dizziness, tremors, weakness and light-headedness.  Hematological:  Negative for adenopathy. Does not bruise/bleed easily.  Psychiatric/Behavioral: Negative for agitation, behavioral problems, confusion, decreased concentration, dysphoric mood and sleep disturbance.       Objective:   Physical Exam  Constitutional: He is oriented to person, place, and time. He appears well-nourished. No distress.  HENT:  Head: Normocephalic and atraumatic.  Right Ear: No drainage, swelling or tenderness. No decreased hearing is noted.  Left Ear: No drainage, swelling or tenderness. No decreased hearing is noted.  Mouth/Throat: Oropharynx is clear and moist. No oropharyngeal exudate.  Eyes: Conjunctivae and EOM are normal. No scleral icterus.  Neck: Normal range of motion. Neck supple.  Cardiovascular: Normal rate  and regular rhythm.   Pulmonary/Chest: Effort normal and breath sounds normal. No respiratory distress. He has no wheezes.  Abdominal: He exhibits no distension.  Musculoskeletal: He exhibits no edema or tenderness.       Right shoulder: He exhibits normal range of motion, no tenderness and no bony tenderness.  Neurological: He is alert and oriented to person, place, and time. He exhibits normal muscle tone. Coordination normal.  Skin: Skin is warm and dry. He is not diaphoretic.  Psychiatric: He has a normal mood and affect. His behavior is normal. Judgment and thought content normal.          Assessment & Plan:   HIV: perfectly suppressed renew ADAP and conitnue TRIUMEQ  HTN: start norvasc 10mg  and follwoup with [facility] pharmacy in next month  Vitals:   02/21/17 1607  BP: (!) 161/92  Pulse: 76  Temp: 98.2 F (36.8 C)     Gonorrhea, chlamydia and syphilis hx: recheck urine  OP and rectum for GC and chlamydia   I spent greater than 25  minutes with the patient including greater than 50% of time in face to face counsel of the patient re his depressive ssx, adherence to his ARV meds, his HTN, and his STI's and in coordination of his care.

## 2017-02-23 ENCOUNTER — Encounter (HOSPITAL_COMMUNITY): Payer: Self-pay | Admitting: Emergency Medicine

## 2017-02-23 ENCOUNTER — Emergency Department (HOSPITAL_COMMUNITY)
Admission: EM | Admit: 2017-02-23 | Discharge: 2017-02-23 | Disposition: A | Discharge status: Home / Self Care | Payer: Self-pay | Attending: Emergency Medicine | Admitting: Emergency Medicine

## 2017-02-23 DIAGNOSIS — I10 Essential (primary) hypertension: Secondary | ICD-10-CM | POA: Insufficient documentation

## 2017-02-23 DIAGNOSIS — J00 Acute nasopharyngitis [common cold]: Secondary | ICD-10-CM | POA: Insufficient documentation

## 2017-02-23 DIAGNOSIS — F17210 Nicotine dependence, cigarettes, uncomplicated: Secondary | ICD-10-CM | POA: Insufficient documentation

## 2017-02-23 DIAGNOSIS — B20 Human immunodeficiency virus [HIV] disease: Secondary | ICD-10-CM | POA: Insufficient documentation

## 2017-02-23 DIAGNOSIS — Z79899 Other long term (current) drug therapy: Secondary | ICD-10-CM | POA: Insufficient documentation

## 2017-02-23 HISTORY — DX: Essential (primary) hypertension: I10

## 2017-02-23 NOTE — ED Notes (Signed)
ED Provider at bedside. 

## 2017-02-23 NOTE — ED Provider Notes (Signed)
[facility] Provider Note   CSN: [id number] Arrival date & time: 02/23/17  0727     History   Chief Complaint Chief Complaint  Patient presents with  . Leg Pain  . Nasal Congestion    HPI Cody Barton is a 41 y.o. male.  Cody Barton comes in today with a runny nose beginning 3 days ago without shortness of breath, chest pain, nausea or vomiting.  He additionally reports some cramping in his legs in the morning and, that he hasn't been drinking enough lately.  He saw Dr. Daiva EvesVan Dam 2 days ago and had a battery of tests ordered.  His CD4 count 1 month ago was 680.  He reports smoking marijuana "more than I need to".        Past Medical History:  Diagnosis Date  . Gonorrhea   . HIV (human immunodeficiency virus infection) (HCC)   . Hypertension   . Major depressive disorder, single episode 04/19/2016  . Otitis media 12/16/2014  . Seasonal allergies 12/16/2014  . Sore throat 12/16/2014    Patient Active Problem List   Diagnosis Date Noted  . Major depressive disorder, single episode 04/19/2016  . Otitis media 12/16/2014  . Seasonal allergies 12/16/2014  . Sore throat 12/16/2014  . HIV disease (HCC) 10/10/2012  . HTN (hypertension) 10/10/2012  . Gonorrhea     History reviewed. No pertinent surgical history.     Home Medications    Prior to Admission medications   Medication Sig Start Date End Date Taking? Authorizing Provider  abacavir-dolutegravir-lamiVUDine (TRIUMEQ) 600-50-300 MG tablet Take 1 tablet by mouth daily. 01/19/17   Pham, Minh Q, RPH  amLODipine (NORVASC) 10 MG tablet Take 1 tablet (10 mg total) by mouth daily. 02/21/17   Randall HissVan Dam, Cornelius N, MD  Multiple Vitamins-Minerals (MULTI-VITAMIN GUMMIES PO) Take 1 tablet by mouth daily.    [provider]  ondansetron (ZOFRAN ODT) 8 MG disintegrating tablet Take 1 tablet (8 mg total) by mouth every 8 (eight) hours as needed for nausea or vomiting. Patient not taking: Reported on 01/19/2017  10/21/16   Deatra Canterxford, Zakaree Mcclenahan J, FNP  TRIUMEQ 600-50-300 MG tablet TAKE 1 TABLET BY MOUTH DAILY Patient not taking: Reported on 02/21/2017 09/23/16   Daiva EvesVan Dam, Lisette Grinderornelius N, MD    Family History Family History  Problem Relation Age of Onset  . Hypertension Father   . Cancer Maternal Grandfather     Social History Social History  Substance Use Topics  . Smoking status: Light Tobacco Smoker    Packs/day: 0.30    Years: 1.50    Types: Cigarettes  . Smokeless tobacco: Never Used     Comment:  4-6 day  . Alcohol use 0.0 oz/week     Comment:    weekend     Allergies   Patient has no known allergies.   Review of Systems Review of Systems  Constitutional: Negative for chills and fever.  HENT: Positive for rhinorrhea. Negative for ear pain and sore throat.   Eyes: Negative for pain and visual disturbance.  Respiratory: Negative for cough and shortness of breath.   Cardiovascular: Negative for chest pain and palpitations.  Gastrointestinal: Negative for abdominal pain and vomiting.  Genitourinary: Negative for dysuria and hematuria.  Musculoskeletal: Negative for arthralgias and back pain.  Skin: Negative for color change and rash.  Neurological: Negative for seizures and syncope.  All other systems reviewed and are negative.    Physical Exam Updated Vital Signs BP (!) 131/101 (BP Location: Right  Arm)   Pulse 85   Temp 98 F (36.7 C) (Oral)   Resp 18   Ht 6\' 1"  (1.854 m)   Wt 93 kg (205 lb)   SpO2 95%   BMI 27.05 kg/m   Physical Exam  Constitutional: He appears well-developed and well-nourished.  HENT:  Head: Normocephalic and atraumatic.  Eyes: Conjunctivae are normal.  Neck: Neck supple.  Cardiovascular: Normal rate and regular rhythm.   No murmur heard. Pulmonary/Chest: Effort normal and breath sounds normal. No respiratory distress.  Abdominal: Soft. There is no tenderness.  Musculoskeletal: He exhibits no edema.  Neurological: He is alert.  Skin: Skin is warm  and dry.  Psychiatric: He has a normal mood and affect.  Nursing note and vitals reviewed.    ED Treatments / Results  Labs (all labs ordered are listed, but only abnormal results are displayed) Labs Reviewed - No data to display  EKG  EKG Interpretation None       Radiology No results found.  Procedures Procedures (including critical care time)  Medications Ordered in ED Medications - No data to display   Initial Impression / Assessment and Plan / ED Course  I have reviewed the triage vital signs and the nursing notes.  Pertinent labs & imaging results that were available during my care of the patient were reviewed by me and considered in my medical decision making (see chart for details).    Viral URI -CD4 count 680 one month ago, pt compliant with triumeq -supportive care -Dr. Daiva Eves following ordered battery of labs 2 days ago  Leg cramps -counseled pt on good hydration practices  Final Clinical Impressions(s) / ED Diagnoses   Final diagnoses:  None    New Prescriptions New Prescriptions   No medications on file     Angelita Ingles, MD 02/23/17 0842    Melene Plan, DO 02/23/17 1207

## 2017-02-23 NOTE — ED Notes (Signed)
Admitting MD at bedside. Family remains at bedside and aware of pending admission

## 2017-02-28 LAB — CYTOLOGY, (ORAL, ANAL, URETHRAL) ANCILLARY ONLY
CHLAMYDIA, DNA PROBE: NEGATIVE
CHLAMYDIA, DNA PROBE: NEGATIVE
NEISSERIA GONORRHEA: POSITIVE — AB
Neisseria Gonorrhea: NEGATIVE

## 2017-02-28 LAB — URINE CYTOLOGY ANCILLARY ONLY
Chlamydia: NEGATIVE
Neisseria Gonorrhea: NEGATIVE

## 2017-03-01 ENCOUNTER — Telehealth: Payer: Self-pay | Admitting: *Deleted

## 2017-03-01 NOTE — Telephone Encounter (Signed)
Attempted to call patient at listed number, voicemail is in spanish. RN left call back number, no message. RN left message for patient's case manager Neila GearCasey Norton as indicated in contact info.  Patient needs an appointment for treatment. Andree CossHowell, Michelle M, RN

## 2017-03-01 NOTE — Telephone Encounter (Signed)
-----   Message from Randall Hissornelius N Van Dam, MD sent at 02/28/2017  9:19 PM EDT ----- Pt needs 250mg  of Ceftriaxone and 1 gram of azithromycin for GC. Partners need to be tested and treated

## 2017-03-02 NOTE — Telephone Encounter (Signed)
Error

## 2017-03-16 ENCOUNTER — Ambulatory Visit: Payer: Self-pay

## 2017-04-03 ENCOUNTER — Telehealth: Payer: Self-pay | Admitting: *Deleted

## 2017-04-03 ENCOUNTER — Ambulatory Visit: Payer: Self-pay

## 2017-04-03 ENCOUNTER — Ambulatory Visit (INDEPENDENT_AMBULATORY_CARE_PROVIDER_SITE_OTHER): Payer: Self-pay | Admitting: *Deleted

## 2017-04-03 VITALS — BP 137/92 | HR 72

## 2017-04-03 DIAGNOSIS — A549 Gonococcal infection, unspecified: Secondary | ICD-10-CM

## 2017-04-03 DIAGNOSIS — B20 Human immunodeficiency virus [HIV] disease: Secondary | ICD-10-CM

## 2017-04-03 MED ORDER — AZITHROMYCIN 250 MG PO TABS
1000.0000 mg | ORAL_TABLET | Freq: Once | ORAL | Status: AC
  Administered 2017-04-03: 1000 mg via ORAL

## 2017-04-03 MED ORDER — CEFTRIAXONE SODIUM 250 MG IJ SOLR
250.0000 mg | Freq: Once | INTRAMUSCULAR | Status: AC
  Administered 2017-04-03: 250 mg via INTRAMUSCULAR

## 2017-04-03 NOTE — Telephone Encounter (Signed)
Thanks so much Michelle 

## 2017-04-03 NOTE — Telephone Encounter (Signed)
Patient called for lab results, stating he missed the follow up visit last month. RN asked him to come in today for nurse visit, as we had been trying to contact him to schedule treatment for gonorrhea since 7/11. Patient updated his contract information, emergency contact information.  He will come today for treatment and RW/ADAP renewal. Providence LaniusHowell, Zachary GeorgeMichelle M, RN

## 2017-04-03 NOTE — Progress Notes (Signed)
Patient states he is taking his blood pressure medication, but has not taken it yet today. He will come back in September for flu shot/nurse visit for blood pressure check.  He states he is having no side effects from the new medication. Andree CossHowell, Rayah Fines M, RN

## 2017-04-14 ENCOUNTER — Encounter: Payer: Self-pay | Admitting: Infectious Disease

## 2017-06-30 ENCOUNTER — Other Ambulatory Visit: Payer: Self-pay | Admitting: Infectious Disease

## 2017-06-30 DIAGNOSIS — B20 Human immunodeficiency virus [HIV] disease: Secondary | ICD-10-CM

## 2017-08-09 ENCOUNTER — Other Ambulatory Visit: Payer: Self-pay

## 2017-08-09 DIAGNOSIS — A549 Gonococcal infection, unspecified: Secondary | ICD-10-CM

## 2017-08-10 LAB — COMPLETE METABOLIC PANEL WITH GFR
AG RATIO: 1.3 (calc) (ref 1.0–2.5)
ALT: 26 U/L (ref 9–46)
AST: 21 U/L (ref 10–40)
Albumin: 4.7 g/dL (ref 3.6–5.1)
Alkaline phosphatase (APISO): 82 U/L (ref 40–115)
BILIRUBIN TOTAL: 0.7 mg/dL (ref 0.2–1.2)
BUN: 12 mg/dL (ref 7–25)
CHLORIDE: 103 mmol/L (ref 98–110)
CO2: 27 mmol/L (ref 20–32)
Calcium: 10.2 mg/dL (ref 8.6–10.3)
Creat: 1.01 mg/dL (ref 0.60–1.35)
GFR, EST AFRICAN AMERICAN: 107 mL/min/{1.73_m2} (ref >=60)
GFR, Est Non African American: 93 mL/min/{1.73_m2} (ref >=60)
Globulin: 3.7 g/dL (calc) (ref 1.9–3.7)
Glucose, Bld: 98 mg/dL (ref 65–99)
Potassium: 4.5 mmol/L (ref 3.5–5.3)
Sodium: 138 mmol/L (ref 135–146)
TOTAL PROTEIN: 8.4 g/dL — AB (ref 6.1–8.1)

## 2017-08-10 LAB — CBC WITH DIFFERENTIAL/PLATELET
BASOS ABS: 59 {cells}/uL (ref 0–200)
Basophils Relative: 1.1 %
EOS PCT: 1.7 %
Eosinophils Absolute: 92 cells/uL (ref 15–500)
HCT: 47.2 % (ref 38.5–50.0)
Hemoglobin: 15.5 g/dL (ref 13.2–17.1)
Lymphs Abs: 1512 cells/uL (ref 850–3900)
MCH: 30.6 pg (ref 27.0–33.0)
MCHC: 32.8 g/dL (ref 32.0–36.0)
MCV: 93.1 fL (ref 80.0–100.0)
MONOS PCT: 11.4 %
MPV: 9.6 fL (ref 7.5–12.5)
NEUTROS ABS: 3121 {cells}/uL (ref 1500–7800)
NEUTROS PCT: 57.8 %
PLATELETS: 317 10*3/uL (ref 140–400)
RBC: 5.07 10*6/uL (ref 4.20–5.80)
RDW: 10.9 % — AB (ref 11.0–15.0)
TOTAL LYMPHOCYTE: 28 %
WBC mixed population: 616 cells/uL (ref 200–950)
WBC: 5.4 10*3/uL (ref 3.8–10.8)

## 2017-08-10 LAB — URINE CYTOLOGY ANCILLARY ONLY
CHLAMYDIA, DNA PROBE: NEGATIVE
NEISSERIA GONORRHEA: NEGATIVE

## 2017-08-10 LAB — LIPID PANEL
Cholesterol: 166 mg/dL (ref <200)
HDL: 61 mg/dL (ref >40)
LDL Cholesterol (Calc): 89 mg/dL (calc)
NON-HDL CHOLESTEROL (CALC): 105 mg/dL (ref <130)
TRIGLYCERIDES: 70 mg/dL (ref <150)
Total CHOL/HDL Ratio: 2.7 (calc) (ref <5.0)

## 2017-08-10 LAB — MICROALBUMIN / CREATININE URINE RATIO
CREATININE, URINE: 67 mg/dL (ref 20–320)
MICROALB/CREAT RATIO: 18 ug/mg{creat} (ref <30)
Microalb, Ur: 1.2 mg/dL

## 2017-08-10 LAB — RPR: RPR: NONREACTIVE

## 2017-08-10 LAB — T-HELPER CELL (CD4) - (RCID CLINIC ONLY)
CD4 % Helper T Cell: 32 % — ABNORMAL LOW (ref 33–55)
CD4 T Cell Abs: 640 /uL (ref 400–2700)

## 2017-08-11 ENCOUNTER — Ambulatory Visit (HOSPITAL_COMMUNITY)
Admission: EM | Admit: 2017-08-11 | Discharge: 2017-08-11 | Disposition: A | Discharge status: Home / Self Care | Payer: Self-pay | Attending: Internal Medicine | Admitting: Internal Medicine

## 2017-08-11 ENCOUNTER — Encounter (HOSPITAL_COMMUNITY): Payer: Self-pay | Admitting: Emergency Medicine

## 2017-08-11 DIAGNOSIS — R197 Diarrhea, unspecified: Secondary | ICD-10-CM | POA: Insufficient documentation

## 2017-08-11 DIAGNOSIS — J029 Acute pharyngitis, unspecified: Secondary | ICD-10-CM | POA: Insufficient documentation

## 2017-08-11 DIAGNOSIS — R1084 Generalized abdominal pain: Secondary | ICD-10-CM | POA: Insufficient documentation

## 2017-08-11 DIAGNOSIS — Z21 Asymptomatic human immunodeficiency virus [HIV] infection status: Secondary | ICD-10-CM | POA: Insufficient documentation

## 2017-08-11 DIAGNOSIS — F17210 Nicotine dependence, cigarettes, uncomplicated: Secondary | ICD-10-CM | POA: Insufficient documentation

## 2017-08-11 DIAGNOSIS — Z8249 Family history of ischemic heart disease and other diseases of the circulatory system: Secondary | ICD-10-CM | POA: Insufficient documentation

## 2017-08-11 DIAGNOSIS — F329 Major depressive disorder, single episode, unspecified: Secondary | ICD-10-CM | POA: Insufficient documentation

## 2017-08-11 DIAGNOSIS — Z79899 Other long term (current) drug therapy: Secondary | ICD-10-CM | POA: Insufficient documentation

## 2017-08-11 DIAGNOSIS — Z809 Family history of malignant neoplasm, unspecified: Secondary | ICD-10-CM | POA: Insufficient documentation

## 2017-08-11 DIAGNOSIS — I10 Essential (primary) hypertension: Secondary | ICD-10-CM | POA: Insufficient documentation

## 2017-08-11 LAB — HIV-1 RNA QUANT-NO REFLEX-BLD
HIV 1 RNA Quant: <20 copies/mL
HIV-1 RNA QUANT, LOG: NOT DETECTED {Log_copies}/mL

## 2017-08-11 MED ORDER — LOPERAMIDE HCL 2 MG PO CAPS: 2.0000 mg | ORAL_CAPSULE | Freq: Two times a day (BID) | ORAL | 0 refills | Status: DC | PRN

## 2017-08-11 NOTE — ED Provider Notes (Signed)
[facility]    CSN: [id number] Arrival date & time: 08/11/17  1009     History   Chief Complaint Chief Complaint  Patient presents with  . Abdominal Pain    HPI Cody Barton is a 41 y.o. male.   41 year old male with history of HIV, HTN comes in for 3 day history of abdominal pain. States abdominal pain started around the same time he was drinking liquor the day of symptom onset.  States he drank about 4-5 cups of hard liquor that night.  He had one episode of vomiting post drinking, without additional vomiting episodes since.  He states that he is having multiple loose stools every day, with associated abdominal cramping.  States abdominal pain is generalized.  Denies blood in the vomit, nausea, blood in the stool.  Denies fever, chills, night sweats.  He has been able to eat without a problem, stating he has been eating his usual foods such as fried chicken.  He denies any recent antibiotic use. He states that he drinks about 3x/week of hard liquor.  States his HIV is well controlled, has continued to take his medications as directed without problems keeping it down.      Past Medical History:  Diagnosis Date  . Gonorrhea   . HIV (human immunodeficiency virus infection) (HCC)   . Hypertension   . Major depressive disorder, single episode 04/19/2016  . Otitis media 12/16/2014  . Seasonal allergies 12/16/2014  . Sore throat 12/16/2014    Patient Active Problem List   Diagnosis Date Noted  . Major depressive disorder, single episode 04/19/2016  . Otitis media 12/16/2014  . Seasonal allergies 12/16/2014  . Sore throat 12/16/2014  . HIV disease (HCC) 10/10/2012  . HTN (hypertension) 10/10/2012  . Gonorrhea     History reviewed. No pertinent surgical history.     Home Medications    Prior to Admission medications   Medication Sig Start Date End Date Taking? Authorizing Provider  abacavir-dolutegravir-lamiVUDine (TRIUMEQ) 600-50-300 MG tablet Take 1  tablet by mouth daily. 01/19/17  Yes Pham, Minh Q, RPH-CPP  amLODipine (NORVASC) 10 MG tablet Take 1 tablet (10 mg total) by mouth daily. 02/21/17  Yes Daiva EvesVan Dam, Lisette Grinderornelius N, MD  Multiple Vitamins-Minerals (MULTI-VITAMIN GUMMIES PO) Take 1 tablet by mouth daily.   Yes [provider]  TRIUMEQ 600-50-300 MG tablet TAKE 1 TABLET BY MOUTH DAILY 06/30/17  Yes Daiva EvesVan Dam, Lisette Grinderornelius N, MD  loperamide (IMODIUM) 2 MG capsule Take 1 capsule (2 mg total) by mouth 2 (two) times daily as needed for diarrhea or loose stools. 08/11/17   Belinda FisherYu, Esperanza Madrazo V, PA-C    Family History Family History  Problem Relation Age of Onset  . Hypertension Father   . Cancer Maternal Grandfather     Social History Social History   Tobacco Use  . Smoking status: Light Tobacco Smoker    Packs/day: 0.30    Years: 1.50    Pack years: 0.45    Types: Cigarettes  . Smokeless tobacco: Never Used  . Tobacco comment:  4-6 day  Substance Use Topics  . Alcohol use: Yes    Alcohol/week: 0.0 oz    Comment:    weekend  . Drug use: Yes    Frequency: 3.0 times per week    Types: Marijuana    Comment: daily use      Allergies   Patient has no known allergies.   Review of Systems Review of Systems  Reason unable to  perform ROS: See HPI as above.     Physical Exam Triage Vital Signs ED Triage Vitals [08/11/17 1051]  Enc Vitals Group     BP 130/77     Pulse Rate 71     Resp 20     Temp 98.3 F (36.8 C)     Temp Source Oral     SpO2 97 %     Weight      Height      Head Circumference      Peak Flow      Pain Score      Pain Loc      Pain Edu?      Excl. in GC?    No data found.  Updated Vital Signs BP 130/77 (BP Location: Left Arm)   Pulse 71   Temp 98.3 F (36.8 C) (Oral)   Resp 20   SpO2 97%   Physical Exam  Constitutional: He is oriented to person, place, and time. He appears well-developed and well-nourished. No distress.  HENT:  Head: Normocephalic and atraumatic.  Cardiovascular: Normal rate,  regular rhythm and normal heart sounds. Exam reveals no gallop and no friction rub.  No murmur heard. Pulmonary/Chest: Effort normal and breath sounds normal. He has no wheezes. He has no rales.  Abdominal: Soft. Bowel sounds are normal. He exhibits no mass. There is no tenderness. There is no rebound and no guarding.  Neurological: He is alert and oriented to person, place, and time.  Skin: Skin is warm and dry.  Psychiatric: He has a normal mood and affect. His behavior is normal. Judgment normal.     UC Treatments / Results  Labs (all labs ordered are listed, but only abnormal results are displayed) Labs Reviewed  GASTROINTESTINAL PANEL BY PCR, STOOL (REPLACES STOOL CULTURE)    EKG  EKG Interpretation None       Radiology No results found.  Procedures Procedures (including critical care time)  Medications Ordered in UC Medications - No data to display   Initial Impression / Assessment and Plan / UC Course  I have reviewed the triage vital signs and the nursing notes.  Pertinent labs & imaging results that were available during my care of the patient were reviewed by me and considered in my medical decision making (see chart for details).    No alarming signs on exam.  Patient was history of well-controlled HIV, heavy alcohol use, without epigastric pain, hematemesis, hematochezia.  He is afebrile, with normal vital signs.  Will start low-dose Imodium to decrease bowel movements.  Stool sample obtained per patient's request given history of HIV.  Patient to start bland diet, advance as tolerated.  Return precautions given.  Final Clinical Impressions(s) / UC Diagnoses   Final diagnoses:  Generalized abdominal pain  Diarrhea, unspecified type    ED Discharge Orders        Ordered    loperamide (IMODIUM) 2 MG capsule  2 times daily PRN     08/11/17 1146        Belinda FisherYu, Vadim Centola V, PA-C 08/11/17 1223

## 2017-08-11 NOTE — Discharge Instructions (Signed)
No alarming signs on exam today. Take imodium to help slow down the diarrhea. As discussed, we want to prevent completely stopping the bowel movements. Stool sample sent today, you will be contacted with any positive results that requires additional treatment. Start a bland diet, I have attached information on your paperwork, and increase slowly as tolerated.  Monitor for any worsening symptoms, pain to the stomach (epigastric) region/back pain, nausea, vomiting, fever, blood in stool, blood in vomit, go to the emergency department for further evaluation.

## 2017-08-11 NOTE — ED Triage Notes (Signed)
PT C/O: abd pain associated w/diarrhea, vomiting.... Reports sx began after drinking alcohol Tuesday   ONSET: 3 days  DENIES: fevers  TAKING MEDS: none   A&O x4... NAD... Ambulatory

## 2017-08-12 LAB — GASTROINTESTINAL PANEL BY PCR, STOOL (REPLACES STOOL CULTURE)
ASTROVIRUS: NOT DETECTED
Adenovirus F40/41: NOT DETECTED
CAMPYLOBACTER SPECIES: NOT DETECTED
CYCLOSPORA CAYETANENSIS: NOT DETECTED
Cryptosporidium: DETECTED — AB
ENTAMOEBA HISTOLYTICA: NOT DETECTED
ENTEROTOXIGENIC E COLI (ETEC): NOT DETECTED
Enteroaggregative E coli (EAEC): NOT DETECTED
Enteropathogenic E coli (EPEC): NOT DETECTED
Giardia lamblia: NOT DETECTED
Norovirus GI/GII: NOT DETECTED
PLESIMONAS SHIGELLOIDES: NOT DETECTED
Rotavirus A: NOT DETECTED
SALMONELLA SPECIES: NOT DETECTED
SAPOVIRUS (I, II, IV, AND V): NOT DETECTED
SHIGA LIKE TOXIN PRODUCING E COLI (STEC): NOT DETECTED
SHIGELLA/ENTEROINVASIVE E COLI (EIEC): NOT DETECTED
VIBRIO CHOLERAE: NOT DETECTED
VIBRIO SPECIES: NOT DETECTED
Yersinia enterocolitica: NOT DETECTED

## 2017-08-23 ENCOUNTER — Ambulatory Visit: Payer: Self-pay | Admitting: Infectious Disease

## 2017-09-04 ENCOUNTER — Ambulatory Visit (INDEPENDENT_AMBULATORY_CARE_PROVIDER_SITE_OTHER): Payer: Self-pay | Admitting: Infectious Disease

## 2017-09-04 ENCOUNTER — Ambulatory Visit: Payer: Self-pay

## 2017-09-04 ENCOUNTER — Encounter: Payer: Self-pay | Admitting: Infectious Disease

## 2017-09-04 VITALS — BP 167/99 | HR 83 | Temp 98.2°F | Ht 73.0 in | Wt 205.0 lb

## 2017-09-04 DIAGNOSIS — A549 Gonococcal infection, unspecified: Secondary | ICD-10-CM

## 2017-09-04 DIAGNOSIS — B20 Human immunodeficiency virus [HIV] disease: Secondary | ICD-10-CM

## 2017-09-04 DIAGNOSIS — Z23 Encounter for immunization: Secondary | ICD-10-CM

## 2017-09-04 MED ORDER — AMLODIPINE BESYLATE 10 MG PO TABS: 10.0000 mg | ORAL_TABLET | Freq: Every day | ORAL | 11 refills | Status: DC

## 2017-09-04 MED ORDER — ABACAVIR-DOLUTEGRAVIR-LAMIVUD 600-50-300 MG PO TABS: 1.0000 | ORAL_TABLET | Freq: Every day | ORAL | 11 refills | Status: DC

## 2017-09-04 NOTE — Progress Notes (Signed)
Subjective:   Chief complaint:ollow-up for HIV on medications   Patient ID: Cody MoccasinConstantine Anschutz, male    DOB: 12/04/1975, 42 y.o.   MRN: [id number]  HPI  42  year-old African-American with HIV infection acquired infection in whom we had started Complera and who has had perfect virological suppression. He admitted that he sometimes was taking the complera on an empty stomach which I said was absolutely a bad idea and we  [facility] and he has maintained perfect virological suppression since then.  He is doing exceptionally well though BP up on first read today.  Lab Results  Component Value Date   HIV1RNAQUANT <20 NOT DETECTED 08/09/2017   HIV1RNAQUANT <20 08/29/2016   HIV1RNAQUANT <20 03/01/2016    Lab Results  Component Value Date   CD4TABS 640 08/09/2017   CD4TABS 680 01/19/2017   CD4TABS 460 08/29/2016       Past Medical History:  Diagnosis Date  . Gonorrhea   . HIV (human immunodeficiency virus infection) (HCC)   . Hypertension   . Major depressive disorder, single episode 04/19/2016  . Otitis media 12/16/2014  . Seasonal allergies 12/16/2014  . Sore throat 12/16/2014    No past surgical history on file.  Family History  Problem Relation Age of Onset  . Hypertension Father   . Cancer Maternal Grandfather       Social History   Socioeconomic History  . Marital status: Single    Spouse name: None  . Number of children: None  . Years of education: None  . Highest education level: None  Social Needs  . Financial resource strain: None  . Food insecurity - worry: None  . Food insecurity - inability: None  . Transportation needs - medical: None  . Transportation needs - non-medical: None  Occupational History  . None  Tobacco Use  . Smoking status: Former Smoker    Packs/day: 0.30    Years: 1.50    Pack years: 0.45    Types: Cigarettes    Last attempt to quit: 08/21/2017    Years since quitting: 0.0  . Smokeless tobacco: Never Used  .  Tobacco comment:  4-6 day  Substance and Sexual Activity  . Alcohol use: Yes    Alcohol/week: 0.0 oz    Comment:    weekend  . Drug use: Yes    Frequency: 3.0 times per week    Types: Marijuana    Comment: daily use   . Sexual activity: Yes    Partners: Male    Birth control/protection: None  Other Topics Concern  . None  Social History Narrative   ** Merged History Encounter **       ** Merged History Encounter **        No Known Allergies   Current Outpatient Medications:  .  abacavir-dolutegravir-lamiVUDine (TRIUMEQ) 600-50-300 MG tablet, Take 1 tablet by mouth daily., Disp: 30 tablet, Rfl: 2 .  amLODipine (NORVASC) 10 MG tablet, Take 1 tablet (10 mg total) by mouth daily., Disp: 30 tablet, Rfl: 11 .  loperamide (IMODIUM) 2 MG capsule, Take 1 capsule (2 mg total) by mouth 2 (two) times daily as needed for diarrhea or loose stools., Disp: 8 capsule, Rfl: 0 .  Multiple Vitamins-Minerals (MULTI-VITAMIN GUMMIES PO), Take 1 tablet by mouth daily., Disp: , Rfl:  .  TRIUMEQ 600-50-300 MG tablet, TAKE 1 TABLET BY MOUTH DAILY, Disp: 30 tablet, Rfl: 5  Lab Results  Component Value Date   HIV1RNAQUANT <20 NOT DETECTED  08/09/2017   HIV1RNAQUANT <20 08/29/2016   HIV1RNAQUANT <20 03/01/2016      Lab Results  Component Value Date   CD4TABS 640 08/09/2017   CD4TABS 680 01/19/2017   CD4TABS 460 08/29/2016   Past Medical History:  Diagnosis Date  . Gonorrhea   . HIV (human immunodeficiency virus infection) (HCC)   . Hypertension   . Major depressive disorder, single episode 04/19/2016  . Otitis media 12/16/2014  . Seasonal allergies 12/16/2014  . Sore throat 12/16/2014    No past surgical history on file.  Family History  Problem Relation Age of Onset  . Hypertension Father   . Cancer Maternal Grandfather       Social History   Socioeconomic History  . Marital status: Single    Spouse name: None  . Number of children: None  . Years of education: None  . Highest  education level: None  Social Needs  . Financial resource strain: None  . Food insecurity - worry: None  . Food insecurity - inability: None  . Transportation needs - medical: None  . Transportation needs - non-medical: None  Occupational History  . None  Tobacco Use  . Smoking status: Former Smoker    Packs/day: 0.30    Years: 1.50    Pack years: 0.45    Types: Cigarettes    Last attempt to quit: 08/21/2017    Years since quitting: 0.0  . Smokeless tobacco: Never Used  . Tobacco comment:  4-6 day  Substance and Sexual Activity  . Alcohol use: Yes    Alcohol/week: 0.0 oz    Comment:    weekend  . Drug use: Yes    Frequency: 3.0 times per week    Types: Marijuana    Comment: daily use   . Sexual activity: Yes    Partners: Male    Birth control/protection: None  Other Topics Concern  . None  Social History Narrative   ** Merged History Encounter **       ** Merged History Encounter **        No Known Allergies   Current Outpatient Medications:  .  abacavir-dolutegravir-lamiVUDine (TRIUMEQ) 600-50-300 MG tablet, Take 1 tablet by mouth daily., Disp: 30 tablet, Rfl: 2 .  amLODipine (NORVASC) 10 MG tablet, Take 1 tablet (10 mg total) by mouth daily., Disp: 30 tablet, Rfl: 11 .  loperamide (IMODIUM) 2 MG capsule, Take 1 capsule (2 mg total) by mouth 2 (two) times daily as needed for diarrhea or loose stools., Disp: 8 capsule, Rfl: 0 .  Multiple Vitamins-Minerals (MULTI-VITAMIN GUMMIES PO), Take 1 tablet by mouth daily., Disp: , Rfl:  .  TRIUMEQ 600-50-300 MG tablet, TAKE 1 TABLET BY MOUTH DAILY, Disp: 30 tablet, Rfl: 5   Review of Systems  Constitutional: Negative for activity change, appetite change, chills, diaphoresis, fatigue, fever and unexpected weight change.  HENT: Negative for congestion, rhinorrhea, sinus pressure, sneezing, sore throat and trouble swallowing.   Eyes: Negative for photophobia and visual disturbance.  Respiratory: Negative for cough, chest  tightness, shortness of breath, wheezing and stridor.   Cardiovascular: Negative for chest pain, palpitations and leg swelling.  Gastrointestinal: Negative for abdominal distention, abdominal pain, anal bleeding, constipation, diarrhea, nausea and vomiting.  Genitourinary: Negative for difficulty urinating, dysuria, flank pain and hematuria.  Musculoskeletal: Negative for back pain, gait problem, joint swelling and myalgias.  Skin: Negative for color change, pallor, rash and wound.  Neurological: Negative for dizziness, tremors, weakness and light-headedness.  Hematological: Negative for  adenopathy. Does not bruise/bleed easily.  Psychiatric/Behavioral: Negative for agitation, behavioral problems, confusion, decreased concentration, dysphoric mood and sleep disturbance.       Objective:   Physical Exam  Constitutional: He is oriented to person, place, and time. He appears well-nourished. No distress.  HENT:  Head: Normocephalic and atraumatic.  Right Ear: No drainage, swelling or tenderness. No decreased hearing is noted.  Left Ear: No drainage, swelling or tenderness. No decreased hearing is noted.  Mouth/Throat: Oropharynx is clear and moist. No oropharyngeal exudate.  Eyes: Conjunctivae and EOM are normal. No scleral icterus.  Neck: Normal range of motion. Neck supple.  Cardiovascular: Normal rate and regular rhythm.  Pulmonary/Chest: Effort normal and breath sounds normal. No respiratory distress. He has no wheezes.  Abdominal: He exhibits no distension.  Musculoskeletal: He exhibits no edema or tenderness.       Right shoulder: He exhibits normal range of motion, no tenderness and no bony tenderness.  Neurological: He is alert and oriented to person, place, and time. He exhibits normal muscle tone. Coordination normal.  Skin: Skin is warm and dry. He is not diaphoretic.  Psychiatric: He has a normal mood and affect. His behavior is normal. Judgment and thought content normal.   Nursing note and vitals reviewed.         Assessment & Plan:   HIV: continue TRIUMEQ and renew HMAP  HTN: norvasc 10mg  and may need to add another drug if it keeps showing this high though last visit was at goal. ? If he is taking this religiously  Vitals:   09/04/17 1139  BP: (!) 167/99  Pulse: 83  Temp: 98.2 F (36.8 C)     Gonorrhea, chlamydia and syphilis hx: recheck   OP : he claims not to have receptive anal intercourse.  I spent greater than 25 minutes with the patient including greater than 50% of time in face to face counsel of the patient re his excellent adherence, immune preservatino and the fact that he should not be able to ever pass the virus to another pt ever so long as he maintains his adherence and virolgoical suppression, discussing data from [employer] study and the "u=u" campaign and in coordination of his care.

## 2017-09-05 LAB — CYTOLOGY, (ORAL, ANAL, URETHRAL) ANCILLARY ONLY
Chlamydia: NEGATIVE
Neisseria Gonorrhea: NEGATIVE

## 2017-12-22 ENCOUNTER — Ambulatory Visit (HOSPITAL_COMMUNITY)
Admission: EM | Admit: 2017-12-22 | Discharge: 2017-12-22 | Disposition: A | Discharge status: Home / Self Care | Payer: Self-pay | Attending: Family Medicine | Admitting: Family Medicine

## 2017-12-22 ENCOUNTER — Encounter (HOSPITAL_COMMUNITY): Payer: Self-pay

## 2017-12-22 ENCOUNTER — Other Ambulatory Visit: Payer: Self-pay

## 2017-12-22 DIAGNOSIS — R0789 Other chest pain: Secondary | ICD-10-CM

## 2017-12-22 DIAGNOSIS — K219 Gastro-esophageal reflux disease without esophagitis: Secondary | ICD-10-CM

## 2017-12-22 MED ORDER — OMEPRAZOLE 20 MG PO CPDR: 20.0000 mg | DELAYED_RELEASE_CAPSULE | Freq: Every day | ORAL | 0 refills | Status: DC

## 2017-12-22 NOTE — ED Triage Notes (Signed)
Pt presents with centralized chest pain x 1 week. Denies any hx or associated symptoms.

## 2017-12-22 NOTE — ED Provider Notes (Addendum)
[facility]    CSN: [id number] Arrival date & time: 12/22/17  1550     History   Chief Complaint Chief Complaint  Patient presents with  . Chest Pain    HPI Cody Barton is a 42 y.o. male.   HPI  Over a month, perhaps longer Patient is a chest pain a couple times a week.  It is in the central to lower central chest (points to the xiphoid).  It is random.  It is moderate.  It is not a crushing or pressure.  Is not associated with any radiation, nausea, dizziness or diaphoresis. He has cardiac risk factor for well-controlled hypertension.  No diabetes, family history, cigarette smoking. EKG today is reviewed and is normal. He does have some heartburn and food intolerance.  He has never been treated for GERD.  He never awakens at night with the pain.  It frequently happens with rest. No underlying heart or lung disease known. Patient is on antivirals to prevent AIDS  Past Medical History:  Diagnosis Date  . Gonorrhea   . HIV (human immunodeficiency virus infection) (HCC)   . Hypertension   . Major depressive disorder, single episode 04/19/2016  . Otitis media 12/16/2014  . Seasonal allergies 12/16/2014  . Sore throat 12/16/2014    Patient Active Problem List   Diagnosis Date Noted  . Major depressive disorder, single episode 04/19/2016  . Otitis media 12/16/2014  . Seasonal allergies 12/16/2014  . Sore throat 12/16/2014  . HIV disease (HCC) 10/10/2012  . HTN (hypertension) 10/10/2012  . Gonorrhea     History reviewed. No pertinent surgical history.     Home Medications    Prior to Admission medications   Medication Sig Start Date End Date Taking? Authorizing Provider  abacavir-dolutegravir-lamiVUDine (TRIUMEQ) 600-50-300 MG tablet Take 1 tablet by mouth daily. 09/04/17  Yes Daiva Eves, Lisette Grinder, MD  amLODipine (NORVASC) 10 MG tablet Take 1 tablet (10 mg total) by mouth daily. 09/04/17  Yes Daiva Eves, Lisette Grinder, MD  loperamide (IMODIUM) 2 MG capsule  Take 1 capsule (2 mg total) by mouth 2 (two) times daily as needed for diarrhea or loose stools. 08/11/17  Yes Yu, Amy V, PA-C  Multiple Vitamins-Minerals (MULTI-VITAMIN GUMMIES PO) Take 1 tablet by mouth daily.   Yes [provider]    Family History Family History  Problem Relation Age of Onset  . Hypertension Father   . Cancer Maternal Grandfather     Social History Social History   Tobacco Use  . Smoking status: Former Smoker    Packs/day: 0.30    Years: 1.50    Pack years: 0.45    Types: Cigarettes    Last attempt to quit: 08/21/2017    Years since quitting: 0.3  . Smokeless tobacco: Never Used  . Tobacco comment:  4-6 day  Substance Use Topics  . Alcohol use: Yes    Alcohol/week: 0.0 oz    Comment:    weekend  . Drug use: Yes    Frequency: 3.0 times per week    Types: Marijuana    Comment: daily use      Allergies   Patient has no known allergies.   Review of Systems Review of Systems  Constitutional: Negative for chills and fever.  HENT: Negative for congestion, ear pain and sore throat.   Eyes: Negative for pain and visual disturbance.  Respiratory: Negative for cough and shortness of breath.   Cardiovascular: Positive for chest pain. Negative for palpitations and  leg swelling.  Gastrointestinal: Negative for abdominal pain, constipation, diarrhea, nausea and vomiting.  Genitourinary: Negative for dysuria and hematuria.  Musculoskeletal: Negative for arthralgias, back pain and myalgias.  Skin: Negative for color change and rash.  Neurological: Negative for seizures, syncope and headaches.  Psychiatric/Behavioral: Negative for behavioral problems and sleep disturbance.  All other systems reviewed and are negative.    Physical Exam Triage Vital Signs ED Triage Vitals  Enc Vitals Group     BP 12/22/17 1641 (!) 127/93     Pulse Rate 12/22/17 1641 71     Resp 12/22/17 1641 18     Temp 12/22/17 1641 (!) 97.3 F (36.3 C)     Temp src --       SpO2 12/22/17 1641 95 %     Weight 12/22/17 1642 205 lb (93 kg)     Height --      Head Circumference --      Peak Flow --      Pain Score 12/22/17 1642 8     Pain Loc --      Pain Edu? --      Excl. in GC? --    No data found.  Updated Vital Signs BP (!) 127/93   Pulse 71   Temp (!) 97.3 F (36.3 C)   Resp 18   Wt 205 lb (93 kg)   SpO2 95%   BMI 27.05 kg/m       Physical Exam  Constitutional: He is oriented to person, place, and time. He appears well-developed and well-nourished. He does not appear ill. No distress.  HENT:  Head: Normocephalic and atraumatic.  Eyes: Pupils are equal, round, and reactive to light. Conjunctivae are normal.  Neck: Normal range of motion. Neck supple.  Cardiovascular: Normal rate, regular rhythm and normal pulses.  No murmur heard. Pulmonary/Chest: Effort normal and breath sounds normal. No respiratory distress.  No chest wall tenderness  Abdominal: Soft. Bowel sounds are normal. There is no tenderness.  No HSM, no epigastric tenderness  Musculoskeletal: He exhibits no edema.       Right lower leg: Normal. He exhibits no edema.       Left lower leg: Normal. He exhibits no edema.  Lymphadenopathy:    He has no cervical adenopathy.  Neurological: He is alert and oriented to person, place, and time.  Skin: Skin is warm and dry. No rash noted.  Psychiatric: He has a normal mood and affect. His behavior is normal.  Nursing note and vitals reviewed.    UC Treatments / Results      Initial Impression / Assessment and Plan / UC Course  I have reviewed the triage vital signs and the nursing notes.  Pertinent labs & imaging results that were available during my care of the patient were reviewed by me and considered in my medical decision making (see chart for details).     Discussed with patient, in-depth, although the differential diagnosis of chest pain.  We discussed heart pain, GI pain, pleurisy, lung pain, chest wall pain.  He is  treated for GI pain as this is most likely diagnosis.  We will follow-up with his doctor in 2 weeks Final Clinical Impressions(s) / UC Diagnoses   Final diagnoses:  None   Discharge Instructions   None     ED Prescriptions    None     Controlled Substance Prescriptions [place] Controlled Substance Registry consulted? Not Applicable   Eustace Moore, MD 12/22/17 Ebony Cargo  Eustace Moore, MD 12/22/17 [phone]

## 2017-12-22 NOTE — ED Notes (Signed)
Patient discharged by provider.

## 2017-12-22 NOTE — Discharge Instructions (Signed)
Take the omeprazole once a day on an empty stomach See your doctor in follow up GO TO ER if chest pain worsens

## 2018-01-03 ENCOUNTER — Encounter: Payer: Self-pay | Admitting: Infectious Disease

## 2018-01-03 ENCOUNTER — Ambulatory Visit: Payer: Self-pay

## 2018-01-03 ENCOUNTER — Other Ambulatory Visit: Payer: Self-pay

## 2018-01-03 DIAGNOSIS — A549 Gonococcal infection, unspecified: Secondary | ICD-10-CM

## 2018-01-03 MED ORDER — ABACAVIR-DOLUTEGRAVIR-LAMIVUD 600-50-300 MG PO TABS: 1.0000 | ORAL_TABLET | Freq: Every day | ORAL | 11 refills | Status: DC

## 2018-02-20 ENCOUNTER — Other Ambulatory Visit: Payer: Self-pay

## 2018-02-20 ENCOUNTER — Encounter: Payer: Self-pay | Admitting: Infectious Disease

## 2018-02-20 DIAGNOSIS — A549 Gonococcal infection, unspecified: Secondary | ICD-10-CM

## 2018-02-21 LAB — CBC WITH DIFFERENTIAL/PLATELET
BASOS ABS: 63 {cells}/uL (ref 0–200)
Basophils Relative: 0.9 %
EOS ABS: 182 {cells}/uL (ref 15–500)
Eosinophils Relative: 2.6 %
HCT: 43.1 % (ref 38.5–50.0)
HEMOGLOBIN: 14.3 g/dL (ref 13.2–17.1)
Lymphs Abs: 2709 cells/uL (ref 850–3900)
MCH: 30.9 pg (ref 27.0–33.0)
MCHC: 33.2 g/dL (ref 32.0–36.0)
MCV: 93.1 fL (ref 80.0–100.0)
MONOS PCT: 10.1 %
MPV: 10.1 fL (ref 7.5–12.5)
NEUTROS ABS: 3339 {cells}/uL (ref 1500–7800)
Neutrophils Relative %: 47.7 %
Platelets: 317 10*3/uL (ref 140–400)
RBC: 4.63 10*6/uL (ref 4.20–5.80)
RDW: 11.6 % (ref 11.0–15.0)
TOTAL LYMPHOCYTE: 38.7 %
WBC mixed population: 707 cells/uL (ref 200–950)
WBC: 7 10*3/uL (ref 3.8–10.8)

## 2018-02-21 LAB — COMPLETE METABOLIC PANEL WITH GFR
AG RATIO: 1.4 (calc) (ref 1.0–2.5)
ALBUMIN MSPROF: 4.5 g/dL (ref 3.6–5.1)
ALT: 28 U/L (ref 9–46)
AST: 24 U/L (ref 10–40)
Alkaline phosphatase (APISO): 79 U/L (ref 40–115)
BUN: 20 mg/dL (ref 7–25)
CO2: 24 mmol/L (ref 20–32)
Calcium: 9.9 mg/dL (ref 8.6–10.3)
Chloride: 107 mmol/L (ref 98–110)
Creat: 1.25 mg/dL (ref 0.60–1.35)
GFR, EST AFRICAN AMERICAN: 83 mL/min/{1.73_m2} (ref >=60)
GFR, EST NON AFRICAN AMERICAN: 72 mL/min/{1.73_m2} (ref >=60)
GLOBULIN: 3.3 g/dL (ref 1.9–3.7)
Glucose, Bld: 117 mg/dL — ABNORMAL HIGH (ref 65–99)
POTASSIUM: 4.2 mmol/L (ref 3.5–5.3)
SODIUM: 142 mmol/L (ref 135–146)
TOTAL PROTEIN: 7.8 g/dL (ref 6.1–8.1)
Total Bilirubin: 0.4 mg/dL (ref 0.2–1.2)

## 2018-02-21 LAB — LIPID PANEL
CHOLESTEROL: 176 mg/dL (ref <200)
HDL: 38 mg/dL — AB (ref >40)
Non-HDL Cholesterol (Calc): 138 mg/dL (calc) — ABNORMAL HIGH (ref <130)
Total CHOL/HDL Ratio: 4.6 (calc) (ref <5.0)
Triglycerides: 406 mg/dL — ABNORMAL HIGH (ref <150)

## 2018-02-21 LAB — T-HELPER CELL (CD4) - (RCID CLINIC ONLY)
CD4 % Helper T Cell: 26 % — ABNORMAL LOW (ref 33–55)
CD4 T Cell Abs: 720 /uL (ref 400–2700)

## 2018-02-21 LAB — RPR: RPR Ser Ql: NONREACTIVE

## 2018-02-21 LAB — URINE CYTOLOGY ANCILLARY ONLY
Chlamydia: NEGATIVE
Neisseria Gonorrhea: NEGATIVE

## 2018-02-23 LAB — HIV-1 RNA QUANT-NO REFLEX-BLD
HIV 1 RNA Quant: 43 copies/mL — ABNORMAL HIGH
HIV-1 RNA Quant, Log: 1.63 Log copies/mL — ABNORMAL HIGH

## 2018-03-06 ENCOUNTER — Ambulatory Visit (INDEPENDENT_AMBULATORY_CARE_PROVIDER_SITE_OTHER): Payer: Self-pay | Admitting: Infectious Disease

## 2018-03-06 VITALS — BP 158/97 | HR 63 | Temp 98.3°F | Ht 73.0 in | Wt 208.0 lb

## 2018-03-06 DIAGNOSIS — I10 Essential (primary) hypertension: Secondary | ICD-10-CM

## 2018-03-06 DIAGNOSIS — B20 Human immunodeficiency virus [HIV] disease: Secondary | ICD-10-CM

## 2018-03-06 DIAGNOSIS — Z23 Encounter for immunization: Secondary | ICD-10-CM

## 2018-03-06 DIAGNOSIS — A549 Gonococcal infection, unspecified: Secondary | ICD-10-CM

## 2018-03-06 MED ORDER — HYDROCHLOROTHIAZIDE 25 MG PO TABS: 25.0000 mg | ORAL_TABLET | Freq: Every day | ORAL | 11 refills | Status: DC

## 2018-03-06 NOTE — Progress Notes (Signed)
Subjective:   Chief complaint:ollow-up for HIV on medications   Patient ID: Cody Barton, male    DOB: 01/16/1976, 42 y.o.   MRN: [id number]  HPI  42  year-old African-American with HIV infection acquired infection in whom we had started Complera and who has had perfect virological suppression. He admitted that he sometimes was taking the complera on an empty stomach which I said was absolutely a bad idea and we  switched him to [facility] and he has maintained perfect virological suppression since then.  He is doing exceptionally well though BP whether they would interact with his antiretrovirals.  Lab Results  Component Value Date   HIV1RNAQUANT 43 (H) 02/20/2018   HIV1RNAQUANT <20 NOT DETECTED 08/09/2017   HIV1RNAQUANT <20 08/29/2016    Lab Results  Component Value Date   CD4TABS 720 02/20/2018   CD4TABS 640 08/09/2017   CD4TABS 680 01/19/2017       Past Medical History:  Diagnosis Date  . Gonorrhea   . HIV (human immunodeficiency virus infection) (HCC)   . Hypertension   . Major depressive disorder, single episode 04/19/2016  . Otitis media 12/16/2014  . Seasonal allergies 12/16/2014  . Sore throat 12/16/2014    No past surgical history on file.  Family History  Problem Relation Age of Onset  . Hypertension Father   . Cancer Maternal Grandfather       Social History   Socioeconomic History  . Marital status: Single    Spouse name: Not on file  . Number of children: Not on file  . Years of education: Not on file  . Highest education level: Not on file  Occupational History  . Not on file  Social Needs  . Financial resource strain: Not on file  . Food insecurity:    Worry: Not on file    Inability: Not on file  . Transportation needs:    Medical: Not on file    Non-medical: Not on file  Tobacco Use  . Smoking status: Former Smoker    Packs/day: 0.30    Years: 1.50    Pack years: 0.45    Types: Cigarettes    Last attempt to quit: 08/21/2017   Years since quitting: 0.5  . Smokeless tobacco: Never Used  . Tobacco comment:  4-6 day  Substance and Sexual Activity  . Alcohol use: Yes    Alcohol/week: 0.0 oz    Comment:    weekend  . Drug use: Yes    Frequency: 3.0 times per week    Types: Marijuana    Comment: daily use   . Sexual activity: Yes    Partners: Male    Birth control/protection: None  Lifestyle  . Physical activity:    Days per week: Not on file    Minutes per session: Not on file  . Stress: Not on file  Relationships  . Social connections:    Talks on phone: Not on file    Gets together: Not on file    Attends religious service: Not on file    Active member of club or organization: Not on file    Attends meetings of clubs or organizations: Not on file    Relationship status: Not on file  Other Topics Concern  . Not on file  Social History Narrative   ** Merged History Encounter **       ** Merged History Encounter **        No Known Allergies   Current Outpatient Medications:  .  abacavir-dolutegravir-lamiVUDine (TRIUMEQ) 600-50-300 MG tablet, Take 1 tablet by mouth daily., Disp: 30 tablet, Rfl: 11 .  amLODipine (NORVASC) 10 MG tablet, Take 1 tablet (10 mg total) by mouth daily., Disp: 30 tablet, Rfl: 11 .  multivitamin-iron-minerals-folic acid (CENTRUM) chewable tablet, Chew 1 tablet by mouth daily., Disp: , Rfl:  .  omeprazole (PRILOSEC) 20 MG capsule, Take 1 capsule (20 mg total) by mouth daily., Disp: 30 capsule, Rfl: 0  Lab Results  Component Value Date   HIV1RNAQUANT 43 (H) 02/20/2018   HIV1RNAQUANT <20 NOT DETECTED 08/09/2017   HIV1RNAQUANT <20 08/29/2016      Lab Results  Component Value Date   CD4TABS 720 02/20/2018   CD4TABS 640 08/09/2017   CD4TABS 680 01/19/2017   Past Medical History:  Diagnosis Date  . Gonorrhea   . HIV (human immunodeficiency virus infection) (HCC)   . Hypertension   . Major depressive disorder, single episode 04/19/2016  . Otitis media 12/16/2014  .  Seasonal allergies 12/16/2014  . Sore throat 12/16/2014    No past surgical history on file.  Family History  Problem Relation Age of Onset  . Hypertension Father   . Cancer Maternal Grandfather       Social History   Socioeconomic History  . Marital status: Single    Spouse name: Not on file  . Number of children: Not on file  . Years of education: Not on file  . Highest education level: Not on file  Occupational History  . Not on file  Social Needs  . Financial resource strain: Not on file  . Food insecurity:    Worry: Not on file    Inability: Not on file  . Transportation needs:    Medical: Not on file    Non-medical: Not on file  Tobacco Use  . Smoking status: Former Smoker    Packs/day: 0.30    Years: 1.50    Pack years: 0.45    Types: Cigarettes    Last attempt to quit: 08/21/2017    Years since quitting: 0.5  . Smokeless tobacco: Never Used  . Tobacco comment:  4-6 day  Substance and Sexual Activity  . Alcohol use: Yes    Alcohol/week: 0.0 oz    Comment:    weekend  . Drug use: Yes    Frequency: 3.0 times per week    Types: Marijuana    Comment: daily use   . Sexual activity: Yes    Partners: Male    Birth control/protection: None  Lifestyle  . Physical activity:    Days per week: Not on file    Minutes per session: Not on file  . Stress: Not on file  Relationships  . Social connections:    Talks on phone: Not on file    Gets together: Not on file    Attends religious service: Not on file    Active member of club or organization: Not on file    Attends meetings of clubs or organizations: Not on file    Relationship status: Not on file  Other Topics Concern  . Not on file  Social History Narrative   ** Merged History Encounter **       ** Merged History Encounter **        No Known Allergies   Current Outpatient Medications:  .  abacavir-dolutegravir-lamiVUDine (TRIUMEQ) 600-50-300 MG tablet, Take 1 tablet by mouth daily., Disp: 30  tablet, Rfl: 11 .  amLODipine (NORVASC) 10 MG tablet, Take 1 tablet (  10 mg total) by mouth daily., Disp: 30 tablet, Rfl: 11 .  multivitamin-iron-minerals-folic acid (CENTRUM) chewable tablet, Chew 1 tablet by mouth daily., Disp: , Rfl:  .  omeprazole (PRILOSEC) 20 MG capsule, Take 1 capsule (20 mg total) by mouth daily., Disp: 30 capsule, Rfl: 0   Review of Systems  Constitutional: Negative for activity change, appetite change, chills, diaphoresis, fatigue, fever and unexpected weight change.  HENT: Negative for congestion, rhinorrhea, sinus pressure, sneezing, sore throat and trouble swallowing.   Eyes: Negative for photophobia and visual disturbance.  Respiratory: Negative for cough, chest tightness, shortness of breath, wheezing and stridor.   Cardiovascular: Negative for chest pain, palpitations and leg swelling.  Gastrointestinal: Negative for abdominal distention, abdominal pain, anal bleeding, constipation, diarrhea, nausea and vomiting.  Genitourinary: Negative for difficulty urinating, dysuria, flank pain and hematuria.  Musculoskeletal: Negative for back pain, gait problem, joint swelling and myalgias.  Skin: Negative for color change, pallor, rash and wound.  Neurological: Negative for dizziness, tremors, weakness and light-headedness.  Hematological: Negative for adenopathy. Does not bruise/bleed easily.  Psychiatric/Behavioral: Negative for agitation, behavioral problems, confusion, decreased concentration, dysphoric mood and sleep disturbance.       Objective:   Physical Exam  Constitutional: He is oriented to person, place, and time. He appears well-nourished. No distress.  HENT:  Head: Normocephalic and atraumatic.  Right Ear: No drainage, swelling or tenderness. No decreased hearing is noted.  Left Ear: No drainage, swelling or tenderness. No decreased hearing is noted.  Mouth/Throat: Oropharynx is clear and moist. No oropharyngeal exudate.  Eyes: Conjunctivae and EOM  are normal. No scleral icterus.  Neck: Normal range of motion. Neck supple.  Cardiovascular: Normal rate and regular rhythm.  Pulmonary/Chest: Effort normal and breath sounds normal. No respiratory distress. He has no wheezes.  Abdominal: He exhibits no distension.  Musculoskeletal: He exhibits no edema or tenderness.       Right shoulder: He exhibits normal range of motion, no tenderness and no bony tenderness.  Neurological: He is alert and oriented to person, place, and time. He exhibits normal muscle tone. Coordination normal.  Skin: Skin is warm and dry. He is not diaphoretic.  Psychiatric: He has a normal mood and affect. His behavior is normal. Judgment and thought content normal.  Nursing note and vitals reviewed.         Assessment & Plan:   HIV: continue TRIUMEQ and renew HMAP with Ladona Ridgel  HTN: norvasc 10mg  and add hydrochlorothiazide and have him visit again in 2 weeks with a nursing visit.  mThere were no vitals filed for this visit.   Gonorrhea, chlamydia and syphilis hx: recheck   OP , rectal and urine gonorrhea and chlamydia   Supplements: Going to send me a picture of the ingredients on the one supplement I know he is on omega fatty acids are fine to be taken.  I spent greater than 25 minutes with the patient including greater than 50% of time in face to face counsel of the patient seen on the importance of controlling blood pressure and the consequences that we do not control it well, reviewing all of his laboratory data praising him on his high adherence to his antiretroviral regimen and in coordination of his care.

## 2018-03-06 NOTE — Addendum Note (Signed)
Addended by: Mariea ClontsGREEN, Lesa Vandall D on: 03/06/2018 03:58 PM   Modules accepted: Orders

## 2018-03-06 NOTE — Patient Instructions (Signed)
Schedule RN visit for BP check in 2 weeks

## 2018-03-07 LAB — CYTOLOGY, (ORAL, ANAL, URETHRAL) ANCILLARY ONLY
CHLAMYDIA, DNA PROBE: NEGATIVE
Chlamydia: NEGATIVE
NEISSERIA GONORRHEA: POSITIVE — AB
Neisseria Gonorrhea: NEGATIVE

## 2018-03-08 ENCOUNTER — Telehealth: Payer: Self-pay | Admitting: Behavioral Health

## 2018-03-08 ENCOUNTER — Ambulatory Visit (INDEPENDENT_AMBULATORY_CARE_PROVIDER_SITE_OTHER): Payer: Self-pay | Admitting: Behavioral Health

## 2018-03-08 DIAGNOSIS — A549 Gonococcal infection, unspecified: Secondary | ICD-10-CM

## 2018-03-08 DIAGNOSIS — A64 Unspecified sexually transmitted disease: Secondary | ICD-10-CM

## 2018-03-08 MED ORDER — CEFTRIAXONE SODIUM 250 MG IJ SOLR
250.0000 mg | Freq: Once | INTRAMUSCULAR | Status: AC
  Administered 2018-03-08: 250 mg via INTRAMUSCULAR

## 2018-03-08 MED ORDER — AZITHROMYCIN 250 MG PO TABS
1000.0000 mg | ORAL_TABLET | Freq: Once | ORAL | Status: AC
  Administered 2018-03-08: 1000 mg via ORAL

## 2018-03-08 NOTE — Progress Notes (Signed)
Patient arrived today for nurse visit, patient received oral Azithromycin 1 gram and Rocephin 250mg  IM X1.  Patient tolerated both well.  Educated patient on the transmission of STI's, offered condoms and patient refused stating he has them at home.  Patient agreed he will notify recent partners to be tested in treated [facility].  Patient also brought in a supplement he has been taking, Test 1700.  Will check with Minh in pharmacy to see if it is OK to take with his HIV medication.  Addendum:  Spoke with Research scientist (life sciences)Minh Pharmacist.  He states supplement should be ok for patient to take as long as it is at separate times of day from his Triumeq.  Patient made aware and he verbalized ans states he will take his Triumeq at night. understanding. Angeline SlimAshley Hill RN

## 2018-03-08 NOTE — Telephone Encounter (Signed)
-----   Message from Randall Hissornelius N Van Dam, MD sent at 03/08/2018  9:53 AM EDT ----- Pt needs IM ceftriaxoner 250mg  and 1 gram of azithromycin. Partners need to be tested and treated and also informed about PrEP given that STI's travel together with HIV. Fortunately Cody Barton has welll controlled HIV

## 2018-03-08 NOTE — Telephone Encounter (Signed)
Called patient, verified identity informed him per Dr. Daiva EvesVan Dam he is positive for Gonorrhea and needs to be tested and treated.  Patient has a nurse visit today to be treated.  Patient informed that partners need to be tested and treated.  Patient verbalized understanding. Angeline SlimAshley Govani Radloff RN

## 2018-03-11 NOTE — Telephone Encounter (Signed)
Perfect

## 2018-03-12 ENCOUNTER — Encounter: Payer: Self-pay | Admitting: Infectious Disease

## 2018-05-29 ENCOUNTER — Other Ambulatory Visit: Payer: Self-pay

## 2018-05-29 ENCOUNTER — Telehealth: Payer: Self-pay | Admitting: Behavioral Health

## 2018-05-29 DIAGNOSIS — B20 Human immunodeficiency virus [HIV] disease: Secondary | ICD-10-CM

## 2018-05-29 DIAGNOSIS — A549 Gonococcal infection, unspecified: Secondary | ICD-10-CM

## 2018-05-29 DIAGNOSIS — I10 Essential (primary) hypertension: Secondary | ICD-10-CM

## 2018-05-29 NOTE — Telephone Encounter (Signed)
Excellent.  Thank you for the update

## 2018-05-29 NOTE — Telephone Encounter (Signed)
Patient presented today as a walk in.  Patient states Dr. Daiva Eves started him on B/P meds back in July and he missed his B/P check appointment.  Patient B/P ws 127/84 and heart rate 73.  Informed patient that his medications are working. Patient was appreciative and verbalized understanding. Angeline Slim RN

## 2018-05-30 LAB — T-HELPER CELL (CD4) - (RCID CLINIC ONLY)
CD4 % Helper T Cell: 26 % — ABNORMAL LOW (ref 33–55)
CD4 T Cell Abs: 860 /uL (ref 400–2700)

## 2018-06-01 ENCOUNTER — Telehealth: Payer: Self-pay | Admitting: Behavioral Health

## 2018-06-01 LAB — CBC WITH DIFFERENTIAL/PLATELET
BASOS PCT: 0.7 %
Basophils Absolute: 51 cells/uL (ref 0–200)
Eosinophils Absolute: 58 cells/uL (ref 15–500)
Eosinophils Relative: 0.8 %
HEMATOCRIT: 41.7 % (ref 38.5–50.0)
Hemoglobin: 13.8 g/dL (ref 13.2–17.1)
LYMPHS ABS: 3146 {cells}/uL (ref 850–3900)
MCH: 31.5 pg (ref 27.0–33.0)
MCHC: 33.1 g/dL (ref 32.0–36.0)
MCV: 95.2 fL (ref 80.0–100.0)
MPV: 9.9 fL (ref 7.5–12.5)
Monocytes Relative: 7.1 %
NEUTROS PCT: 48.3 %
Neutro Abs: 3526 cells/uL (ref 1500–7800)
Platelets: 328 10*3/uL (ref 140–400)
RBC: 4.38 10*6/uL (ref 4.20–5.80)
RDW: 11 % (ref 11.0–15.0)
Total Lymphocyte: 43.1 %
WBC mixed population: 518 cells/uL (ref 200–950)
WBC: 7.3 10*3/uL (ref 3.8–10.8)

## 2018-06-01 LAB — LIPID PANEL
CHOL/HDL RATIO: 3.8 (calc) (ref <5.0)
Cholesterol: 148 mg/dL (ref <200)
HDL: 39 mg/dL — ABNORMAL LOW (ref >40)
LDL CHOLESTEROL (CALC): 81 mg/dL
NON-HDL CHOLESTEROL (CALC): 109 mg/dL (ref <130)
TRIGLYCERIDES: 185 mg/dL — AB (ref <150)

## 2018-06-01 LAB — COMPLETE METABOLIC PANEL WITH GFR
AG Ratio: 1.2 (calc) (ref 1.0–2.5)
ALKALINE PHOSPHATASE (APISO): 68 U/L (ref 40–115)
ALT: 17 U/L (ref 9–46)
AST: 18 U/L (ref 10–40)
Albumin: 4.2 g/dL (ref 3.6–5.1)
BILIRUBIN TOTAL: 0.5 mg/dL (ref 0.2–1.2)
BUN / CREAT RATIO: 14 (calc) (ref 6–22)
BUN: 22 mg/dL (ref 7–25)
CHLORIDE: 103 mmol/L (ref 98–110)
CO2: 25 mmol/L (ref 20–32)
CREATININE: 1.61 mg/dL — AB (ref 0.60–1.35)
Calcium: 9.7 mg/dL (ref 8.6–10.3)
GFR, Est African American: 61 mL/min/{1.73_m2} (ref >=60)
GFR, Est Non African American: 52 mL/min/{1.73_m2} — ABNORMAL LOW (ref >=60)
GLUCOSE: 125 mg/dL — AB (ref 65–99)
Globulin: 3.5 g/dL (calc) (ref 1.9–3.7)
Potassium: 4.1 mmol/L (ref 3.5–5.3)
Sodium: 136 mmol/L (ref 135–146)
Total Protein: 7.7 g/dL (ref 6.1–8.1)

## 2018-06-01 LAB — HIV-1 RNA QUANT-NO REFLEX-BLD
HIV 1 RNA Quant: <20 copies/mL — AB
HIV-1 RNA QUANT, LOG: DETECTED {Log_copies}/mL — AB

## 2018-06-01 LAB — RPR: RPR: NONREACTIVE

## 2018-06-01 NOTE — Telephone Encounter (Signed)
-----   Message from Randall Hiss, MD sent at 06/01/2018 12:07 PM EDT ----- Regarding: FW: [place] Cr is up. He should hydrate avoid NSAIDs and have BMP checked in another week ----- Message ----- From: Interface, [facility] Lab Results In Sent: 05/29/2018   3:35 PM EDT To: Randall Hiss, MD

## 2018-06-01 NOTE — Telephone Encounter (Signed)
Called patient left generic voicemail for him to call the office back.  Will try again later today to reach him. Angeline Slim RN

## 2018-06-03 NOTE — Telephone Encounter (Signed)
Thx Morrie Sheldon '

## 2018-06-04 NOTE — Telephone Encounter (Signed)
[facility], informed him that his Creatinine was elevated.  Encouraged him to hydrate and avoid NSAIDs.  Cody Barton verbalized understanding.  Also scheduled Cody Barton a lab visit for repeat [facility] Thursday 06/07/2018. Angeline Slim RN

## 2018-06-04 NOTE — Telephone Encounter (Signed)
Perfect

## 2018-06-07 ENCOUNTER — Other Ambulatory Visit: Payer: Self-pay

## 2018-06-07 DIAGNOSIS — B20 Human immunodeficiency virus [HIV] disease: Secondary | ICD-10-CM

## 2018-06-12 ENCOUNTER — Encounter: Payer: Self-pay | Admitting: Infectious Diseases

## 2018-06-12 ENCOUNTER — Ambulatory Visit (INDEPENDENT_AMBULATORY_CARE_PROVIDER_SITE_OTHER): Payer: Self-pay | Admitting: Infectious Diseases

## 2018-06-12 VITALS — BP 132/95 | HR 81 | Temp 98.6°F | Wt 209.0 lb

## 2018-06-12 DIAGNOSIS — R7989 Other specified abnormal findings of blood chemistry: Secondary | ICD-10-CM

## 2018-06-12 DIAGNOSIS — Z0000 Encounter for general adult medical examination without abnormal findings: Secondary | ICD-10-CM

## 2018-06-12 DIAGNOSIS — B20 Human immunodeficiency virus [HIV] disease: Secondary | ICD-10-CM

## 2018-06-12 DIAGNOSIS — I10 Essential (primary) hypertension: Secondary | ICD-10-CM

## 2018-06-12 DIAGNOSIS — Z23 Encounter for immunization: Secondary | ICD-10-CM

## 2018-06-12 LAB — BASIC METABOLIC PANEL
BUN: 15 mg/dL (ref 7–25)
CO2: 29 mmol/L (ref 20–32)
CREATININE: 1.06 mg/dL (ref 0.60–1.35)
Calcium: 10.3 mg/dL (ref 8.6–10.3)
Chloride: 100 mmol/L (ref 98–110)
Glucose, Bld: 110 mg/dL — ABNORMAL HIGH (ref 65–99)
POTASSIUM: 4.3 mmol/L (ref 3.5–5.3)
SODIUM: 138 mmol/L (ref 135–146)

## 2018-06-12 NOTE — Patient Instructions (Addendum)
You are doing fabulous on your   Things to avoid in supplements - caffeine, ginseng, bitter orange; these can increase your blood   Try only a half of a seasoning packet of oodles of noodles and make these a nice treat.   Try frozen or fresh vegetables instead of canned vegetables.   Decreasing your tobacco use will also be helpful to lower your blood pressure. Even reducing your cigarette use can help if you are not quite ready to quit.   Please come back in January to see Dr. Daiva Eves and our financial team to re-apply for your ADAP and check in on your blood pressure again.    DASH Eating Plan DASH stands for "Dietary Approaches to Stop Hypertension." The DASH eating plan is a healthy eating plan that has been shown to reduce high blood pressure (hypertension). It may also reduce your risk for type 2 diabetes, heart disease, and stroke. The DASH eating plan may also help with weight loss. What are tips for following this plan? General guidelines  Avoid eating more than 2,300 mg (milligrams) of salt (sodium) a day. If you have hypertension, you may need to reduce your sodium intake to 1,500 mg a day.  Limit alcohol intake to no more than 1 drink a day for nonpregnant women and 2 drinks a day for men. One drink equals 12 oz of beer, 5 oz of wine, or 1 oz of hard liquor.  Work with your health care provider to maintain a healthy body weight or to lose weight. Ask what an ideal weight is for you.  Get at least 30 minutes of exercise that causes your heart to beat faster (aerobic exercise) most days of the week. Activities may include walking, swimming, or biking.  Work with your health care provider or diet and nutrition specialist (dietitian) to adjust your eating plan to your individual calorie needs. Reading food labels  Check food labels for the amount of sodium per serving. Choose foods with less than 5 percent of the Daily Value of sodium. Generally, foods with less than 300 mg of  sodium per serving fit into this eating plan.  To find whole grains, look for the word "whole" as the first word in the ingredient list. Shopping  Buy products labeled as "low-sodium" or "no salt added."  Buy fresh foods. Avoid canned foods and premade or frozen meals. Cooking  Avoid adding salt when cooking. Use salt-free seasonings or herbs instead of table salt or sea salt. Check with your health care provider or pharmacist before using salt substitutes.  Do not fry foods. Cook foods using healthy methods such as baking, boiling, grilling, and broiling instead.  Cook with heart-healthy oils, such as olive, canola, soybean, or sunflower oil. Meal planning   Eat a balanced diet that includes: ? 5 or more servings of fruits and vegetables each day. At each meal, try to fill half of your plate with fruits and vegetables. ? Up to 6-8 servings of whole grains each day. ? Less than 6 oz of lean meat, poultry, or fish each day. A 3-oz serving of meat is about the same size as a deck of cards. One egg equals 1 oz. ? 2 servings of low-fat dairy each day. ? A serving of nuts, seeds, or beans 5 times each week. ? Heart-healthy fats. Healthy fats called Omega-3 fatty acids are found in foods such as flaxseeds and coldwater fish, like sardines, salmon, and mackerel.  Limit how much you eat  of the following: ? Canned or prepackaged foods. ? Food that is high in trans fat, such as fried foods. ? Food that is high in saturated fat, such as fatty meat. ? Sweets, desserts, sugary drinks, and other foods with added sugar. ? Full-fat dairy products.  Do not salt foods before eating.  Try to eat at least 2 vegetarian meals each week.  Eat more home-cooked food and less restaurant, buffet, and fast food.  When eating at a restaurant, ask that your food be prepared with less salt or no salt, if possible. What foods are recommended? The items listed may not be a complete list. Talk with your  dietitian about what dietary choices are best for you. Grains Whole-grain or whole-wheat bread. Whole-grain or whole-wheat pasta. Brown rice. [place]row. Bulgur. Whole-grain and low-sodium cereals. Pita bread. Low-fat, low-sodium crackers. Whole-wheat flour tortillas. Vegetables Fresh or frozen vegetables (raw, steamed, roasted, or grilled). Low-sodium or reduced-sodium tomato and vegetable juice. Low-sodium or reduced-sodium tomato sauce and tomato paste. Low-sodium or reduced-sodium canned vegetables. Fruits All fresh, dried, or frozen fruit. Canned fruit in natural juice (without added sugar). Meat and other protein foods Skinless chicken or [place]. Ground chicken or [place]. Pork with fat trimmed off. Fish and seafood. Egg whites. Dried beans, peas, or lentils. Unsalted nuts, nut butters, and seeds. Unsalted canned beans. Lean cuts of beef with fat trimmed off. Low-sodium, lean deli meat. Dairy Low-fat (1%) or fat-free (skim) milk. Fat-free, low-fat, or reduced-fat cheeses. Nonfat, low-sodium ricotta or cottage cheese. Low-fat or nonfat yogurt. Low-fat, low-sodium cheese. Fats and oils Soft margarine without trans fats. Vegetable oil. Low-fat, reduced-fat, or light mayonnaise and salad dressings (reduced-sodium). Canola, safflower, olive, soybean, and sunflower oils. Avocado. Seasoning and other foods Herbs. Spices. Seasoning mixes without salt. Unsalted popcorn and pretzels. Fat-free sweets. What foods are not recommended? The items listed may not be a complete list. Talk with your dietitian about what dietary choices are best for you. Grains Baked goods made with fat, such as croissants, muffins, or some breads. Dry pasta or rice meal packs. Vegetables Creamed or fried vegetables. Vegetables in a cheese sauce. Regular canned vegetables (not low-sodium or reduced-sodium). Regular canned tomato sauce and paste (not low-sodium or reduced-sodium). Regular tomato and vegetable juice (not  low-sodium or reduced-sodium). Angie Fava. Olives. Fruits Canned fruit in a light or heavy syrup. Fried fruit. Fruit in cream or butter sauce. Meat and other protein foods Fatty cuts of meat. Ribs. Fried meat. Berniece Salines. Sausage. Bologna and other processed lunch meats. Salami. Fatback. Hotdogs. Bratwurst. Salted nuts and seeds. Canned beans with added salt. Canned or smoked fish. Whole eggs or egg yolks. Chicken or [place] with skin. Dairy Whole or 2% milk, cream, and half-and-half. Whole or full-fat cream cheese. Whole-fat or sweetened yogurt. Full-fat cheese. Nondairy creamers. Whipped toppings. Processed cheese and cheese spreads. Fats and oils Butter. Stick margarine. Lard. Shortening. Ghee. Bacon fat. Tropical oils, such as coconut, palm kernel, or palm oil. Seasoning and other foods Salted popcorn and pretzels. Onion salt, garlic salt, seasoned salt, table salt, and sea salt. Worcestershire sauce. Tartar sauce. Barbecue sauce. Teriyaki sauce. Soy sauce, including reduced-sodium. Steak sauce. Canned and packaged gravies. Fish sauce. Oyster sauce. Cocktail sauce. Horseradish that you find on the shelf. Ketchup. Mustard. Meat flavorings and tenderizers. Bouillon cubes. Hot sauce and Tabasco sauce. Premade or packaged marinades. Premade or packaged taco seasonings. Relishes. Regular salad dressings. Where to find more information:  National Heart, Lung, and Blood Institute: https://wilson-eaton.com/  American Heart  Association: www.heart.org Summary  The DASH eating plan is a healthy eating plan that has been shown to reduce high blood pressure (hypertension). It may also reduce your risk for type 2 diabetes, heart disease, and stroke.  With the DASH eating plan, you should limit salt (sodium) intake to 2,300 mg a day. If you have hypertension, you may need to reduce your sodium intake to 1,500 mg a day.  When on the DASH eating plan, aim to eat more fresh fruits and vegetables, whole grains, lean proteins,  low-fat dairy, and heart-healthy fats.  Work with your health care provider or diet and nutrition specialist (dietitian) to adjust your eating plan to your individual calorie needs. This information is not intended to replace advice given to you by your health care provider. Make sure you discuss any questions you have with your health care provider. Document Released: 07/28/2011 Document Revised: 08/01/2016 Document Reviewed: 08/01/2016 Elsevier Interactive Patient Education  [employer].

## 2018-06-12 NOTE — Assessment & Plan Note (Signed)
He has been drinking a lot of water and making normal amount of urine. Will repeat this today.

## 2018-06-12 NOTE — Assessment & Plan Note (Signed)
Will give flu vaccine today. Offered condoms and STI screening - accepted.

## 2018-06-12 NOTE — Progress Notes (Signed)
Name: Cody Barton  DOB: 11-18-1975 MRN: [id number] PCP: Patient, No Pcp Per    Patient Active Problem List   Diagnosis Date Noted  . Healthcare maintenance 06/12/2018  . Elevated serum creatinine 06/12/2018  . Major depressive disorder, single episode 04/19/2016  . Otitis media 12/16/2014  . Seasonal allergies 12/16/2014  . HIV disease (HCC) 10/10/2012  . HTN (hypertension) 10/10/2012     Subjective:   Chief Complaint  Patient presents with  . Follow-up    HIV, BP, labs     HPI/ROS:  Cody Barton is here today for follow up for his HIV care and blood pressure management. He is a patient of Dr. Clinton Gallant and last saw him in July of this year. He has continued on Triumeq once daily and has done very well controlling his HIV infection with VL consistently < 50 copies most recently undetectable again. CD4 count is intact with last reading 720 in July of this year. He is taking a few other medications - multivitamins/supplements and norvasc daily. He is not taking the HCTZ and was unaware he was supposed to be on it. He has also been taking intermittently 1/2 a dose of a non-prescription pill for erectile dysfunction called Rhino 2500. He would like to know if this is safe to take.   With regards to his BP - he recently realized that "sodium" means salt on nutrition labels and has been trying to pay more attention to this. He does not eat fast food. He usually prepares meals at home - baked chicken, jambalaya rice, various meats. Vegetables are often canned. He also occasionally like to eat oodles of noodles soup with sandwiches. He did not realize how much salt is in a lot of what he eats and working at [employer] has made him more aware of this. He does smoke cigarettes daily and has not reduced use yet. Only drinking alcohol on social gatherings (which is at most 1x a week).   He has not yet received a flu vaccine.   Review of Systems  Constitutional: Negative for chills, fever,  malaise/fatigue and weight loss.  HENT: Negative for sore throat.        No dental problems  Respiratory: Negative for cough and sputum production.   Cardiovascular: Negative for chest pain and leg swelling.  Gastrointestinal: Negative for abdominal pain, diarrhea and vomiting.  Genitourinary: Negative for dysuria and flank pain.  Musculoskeletal: Negative for joint pain, myalgias and neck pain.  Skin: Negative for rash.  Neurological: Negative for dizziness, tingling and headaches.  Psychiatric/Behavioral: Negative for depression and substance abuse. The patient is not nervous/anxious and does not have insomnia.     Past Medical History:  Diagnosis Date  . Gonorrhea   . HIV (human immunodeficiency virus infection) (HCC)   . Hypertension   . Major depressive disorder, single episode 04/19/2016  . Otitis media 12/16/2014  . Seasonal allergies 12/16/2014  . Sore throat 12/16/2014    Outpatient Medications Prior to Visit  Medication Sig Dispense Refill  . abacavir-dolutegravir-lamiVUDine (TRIUMEQ) 600-50-300 MG tablet Take 1 tablet by mouth daily. 30 tablet 11  . amLODipine (NORVASC) 10 MG tablet Take 1 tablet (10 mg total) by mouth daily. 30 tablet 11  . multivitamin-iron-minerals-folic acid (CENTRUM) chewable tablet Chew 1 tablet by mouth daily.    Marland Kitchen omeprazole (PRILOSEC) 20 MG capsule Take 1 capsule (20 mg total) by mouth daily. 30 capsule 0  . hydrochlorothiazide (HYDRODIURIL) 25 MG tablet Take 1 tablet (25 mg total)  by mouth daily. (Patient not taking: Reported on 06/12/2018) 30 tablet 11   No facility-administered medications prior to visit.      No Known Allergies  Social History   Tobacco Use  . Smoking status: Former Smoker    Packs/day: 0.30    Years: 1.50    Pack years: 0.45    Types: Cigarettes    Last attempt to quit: 08/21/2017    Years since quitting: 0.8  . Smokeless tobacco: Never Used  . Tobacco comment:  4-6 day  Substance Use Topics  . Alcohol use: Yes      Alcohol/week: 0.0 standard drinks    Comment:    weekend  . Drug use: Yes    Frequency: 3.0 times per week    Types: Marijuana    Comment: daily use     Social History   Substance and Sexual Activity  Sexual Activity Yes  . Partners: Male  . Birth control/protection: None     Objective:   Vitals:   06/12/18 0936  BP: (!) 132/95  Pulse: 81  Temp: 98.6 F (37 C)  Weight: 209 lb (94.8 kg)   Body mass index is 27.57 kg/m.  Physical Exam  Constitutional: He is oriented to person, place, and time. He appears well-developed and well-nourished.  Seated comfortably in chair during visit.   HENT:  Mouth/Throat: Oropharynx is clear and moist and mucous membranes are normal. Normal dentition. No dental abscesses.  Cardiovascular: Normal rate, regular rhythm and normal heart sounds.  Pulmonary/Chest: Effort normal and breath sounds normal.  Abdominal: Soft. He exhibits no distension. There is no tenderness.  Lymphadenopathy:    He has no cervical adenopathy.  Neurological: He is alert and oriented to person, place, and time.  Skin: Skin is warm and dry. No rash noted.  Psychiatric: He has a normal mood and affect. Judgment normal.  In good spirits today and engaged in care discussion.   Vitals reviewed.   Lab Results Lab Results  Component Value Date   WBC 7.3 05/29/2018   HGB 13.8 05/29/2018   HCT 41.7 05/29/2018   MCV 95.2 05/29/2018   PLT 328 05/29/2018    Lab Results  Component Value Date   CREATININE 1.61 (H) 05/29/2018   BUN 22 05/29/2018   NA 136 05/29/2018   K 4.1 05/29/2018   CL 103 05/29/2018   CO2 25 05/29/2018    Lab Results  Component Value Date   ALT 17 05/29/2018   AST 18 05/29/2018   ALKPHOS 65 01/19/2017   BILITOT 0.5 05/29/2018    Lab Results  Component Value Date   CHOL 148 05/29/2018   HDL 39 (L) 05/29/2018   LDLCALC 81 05/29/2018   TRIG 185 (H) 05/29/2018   CHOLHDL 3.8 05/29/2018   HIV 1 RNA Quant (copies/mL)  Date Value   05/29/2018 <20 DETECTED (A)  02/20/2018 43 (H)  08/09/2017 <20 NOT DETECTED   CD4 T Cell Abs (/uL)  Date Value  05/29/2018 860  02/20/2018 720  08/09/2017 640     Assessment & Plan:   Problem List Items Addressed This Visit      Unprioritized   Elevated serum creatinine    He has been drinking a lot of water and making normal amount of urine. Will repeat this today.       Relevant Orders   Basic metabolic panel   Healthcare maintenance    Will give flu vaccine today. Offered condoms and STI screening - accepted.  HIV disease (HCC) - Primary    He has been doing very well on his Triumeq - had very low level detectable virus with 43 copies in April likely attributed to taking too close to supplements/vitamins. He has done much better about separating these and takes his Triumeq at night. He was undetectable 2 weeks ago. I congratulated him on his good work. He will follow up with Dr. Daiva Eves again in 3 months so he can re-apply for [facility] in January as well and check on his BP/kidney function again.       HTN (hypertension)    BP Readings from Last 3 Encounters:  06/12/18 (!) 132/95  03/06/18 (!) 158/97  12/22/17 (!) 127/93   He smoked before coming in today - repeated BP reading about the same. Discussed possibly checking values at home and explained target of non-diabetic male to be < 150/90. Will see what his creatinine comes back at today considering his acute rise recently. May consider combo pill with amlodipine + ARB vs adding HCTZ as Dr. Daiva Eves planned to take in AM.   Reviewed dietary modifications to lower salt content (washing canned vegs, choosing frozen or fresh, not cooking out of boxes/seasoning packets, etc. I also asked him to try to reduce his smoking by 1/2 if he can.   In looking into the ingredients of the ED supplement - I cautioned him that it has the potential to interact with certain heart/blood pressure medications (?phosphodiesterase inhibiting  effect) and to discuss at future appointments should more medications be added. Also advised to separate by triumeq as I am not certain as to all the ingredients.        Other Visit Diagnoses    Need for immunization against influenza       Relevant Orders   Flu Vaccine QUAD 36+ mos IM (Completed)     Return in about 3 months (around 09/12/2018).  Rexene Alberts, MSN, NP-C [facility] Pager: [phone] Office: [phone]  06/12/18  1:01 PM

## 2018-06-12 NOTE — Assessment & Plan Note (Addendum)
He has been doing very well on his Triumeq - had very low level detectable virus with 43 copies in April likely attributed to taking too close to supplements/vitamins. He has done much better about separating these and takes his Triumeq at night. He was undetectable 2 weeks ago. I congratulated him on his good work. He will follow up with Dr. Daiva Eves again in 3 months so he can re-apply for [facility] in January as well and check on his BP/kidney function again.

## 2018-06-12 NOTE — Assessment & Plan Note (Addendum)
BP Readings from Last 3 Encounters:  06/12/18 (!) 132/95  03/06/18 (!) 158/97  12/22/17 (!) 127/93   He smoked before coming in today - repeated BP reading about the same. Discussed possibly checking values at home and explained target of non-diabetic male to be < 150/90. Will see what his creatinine comes back at today considering his acute rise recently. May consider combo pill with amlodipine + ARB vs adding HCTZ as Dr. Daiva Eves planned to take in AM.   Reviewed dietary modifications to lower salt content (washing canned vegs, choosing frozen or fresh, not cooking out of boxes/seasoning packets, etc. I also asked him to try to reduce his smoking by 1/2 if he can.   In looking into the ingredients of the ED supplement - I cautioned him that it has the potential to interact with certain heart/blood pressure medications (?phosphodiesterase inhibiting effect) and to discuss at future appointments should more medications be added. Also advised to separate by triumeq as I am not certain as to all the ingredients.

## 2018-08-21 ENCOUNTER — Other Ambulatory Visit: Payer: Self-pay | Admitting: Behavioral Health

## 2018-08-21 DIAGNOSIS — B20 Human immunodeficiency virus [HIV] disease: Secondary | ICD-10-CM

## 2018-08-21 DIAGNOSIS — Z79899 Other long term (current) drug therapy: Secondary | ICD-10-CM

## 2018-08-21 DIAGNOSIS — Z113 Encounter for screening for infections with a predominantly sexual mode of transmission: Secondary | ICD-10-CM

## 2018-09-03 ENCOUNTER — Other Ambulatory Visit: Payer: Self-pay

## 2018-09-03 ENCOUNTER — Ambulatory Visit: Payer: Self-pay

## 2018-09-04 ENCOUNTER — Other Ambulatory Visit: Payer: Self-pay

## 2018-09-04 ENCOUNTER — Ambulatory Visit: Payer: Self-pay

## 2018-09-04 DIAGNOSIS — B20 Human immunodeficiency virus [HIV] disease: Secondary | ICD-10-CM

## 2018-09-04 DIAGNOSIS — Z113 Encounter for screening for infections with a predominantly sexual mode of transmission: Secondary | ICD-10-CM

## 2018-09-04 DIAGNOSIS — Z79899 Other long term (current) drug therapy: Secondary | ICD-10-CM

## 2018-09-05 ENCOUNTER — Ambulatory Visit: Payer: Self-pay

## 2018-09-05 ENCOUNTER — Other Ambulatory Visit: Payer: Self-pay

## 2018-09-05 LAB — URINE CYTOLOGY ANCILLARY ONLY
Chlamydia: NEGATIVE
NEISSERIA GONORRHEA: NEGATIVE

## 2018-09-05 LAB — T-HELPER CELL (CD4) - (RCID CLINIC ONLY)
CD4 T CELL HELPER: 29 % — AB (ref 33–55)
CD4 T Cell Abs: 800 /uL (ref 400–2700)

## 2018-09-06 LAB — CBC WITH DIFFERENTIAL/PLATELET
Absolute Monocytes: 546 cells/uL (ref 200–950)
Basophils Absolute: 59 cells/uL (ref 0–200)
Basophils Relative: 0.9 %
EOS PCT: 1.8 %
Eosinophils Absolute: 117 cells/uL (ref 15–500)
HCT: 40.9 % (ref 38.5–50.0)
Hemoglobin: 13.9 g/dL (ref 13.2–17.1)
Lymphs Abs: 2802 cells/uL (ref 850–3900)
MCH: 31.8 pg (ref 27.0–33.0)
MCHC: 34 g/dL (ref 32.0–36.0)
MCV: 93.6 fL (ref 80.0–100.0)
MPV: 10.2 fL (ref 7.5–12.5)
Monocytes Relative: 8.4 %
Neutro Abs: 2977 cells/uL (ref 1500–7800)
Neutrophils Relative %: 45.8 %
Platelets: 334 10*3/uL (ref 140–400)
RBC: 4.37 10*6/uL (ref 4.20–5.80)
RDW: 11.2 % (ref 11.0–15.0)
Total Lymphocyte: 43.1 %
WBC: 6.5 10*3/uL (ref 3.8–10.8)

## 2018-09-06 LAB — LIPID PANEL
CHOL/HDL RATIO: 5.4 (calc) — AB (ref <5.0)
Cholesterol: 166 mg/dL (ref <200)
HDL: 31 mg/dL — ABNORMAL LOW (ref >40)
Non-HDL Cholesterol (Calc): 135 mg/dL (calc) — ABNORMAL HIGH (ref <130)
Triglycerides: 556 mg/dL — ABNORMAL HIGH (ref <150)

## 2018-09-06 LAB — COMPREHENSIVE METABOLIC PANEL
AG Ratio: 1.1 (calc) (ref 1.0–2.5)
ALT: 20 U/L (ref 9–46)
AST: 20 U/L (ref 10–40)
Albumin: 4 g/dL (ref 3.6–5.1)
Alkaline phosphatase (APISO): 67 U/L (ref 40–115)
BUN: 16 mg/dL (ref 7–25)
CO2: 25 mmol/L (ref 20–32)
Calcium: 9.6 mg/dL (ref 8.6–10.3)
Chloride: 105 mmol/L (ref 98–110)
Creat: 1.08 mg/dL (ref 0.60–1.35)
Globulin: 3.5 g/dL (calc) (ref 1.9–3.7)
Glucose, Bld: 112 mg/dL — ABNORMAL HIGH (ref 65–99)
Potassium: 3.9 mmol/L (ref 3.5–5.3)
Sodium: 137 mmol/L (ref 135–146)
Total Bilirubin: 0.4 mg/dL (ref 0.2–1.2)
Total Protein: 7.5 g/dL (ref 6.1–8.1)

## 2018-09-06 LAB — RPR: RPR: NONREACTIVE

## 2018-09-06 LAB — HIV-1 RNA QUANT-NO REFLEX-BLD
HIV 1 RNA QUANT: DETECTED {copies}/mL — AB
HIV-1 RNA QUANT, LOG: DETECTED {Log_copies}/mL — AB

## 2018-09-17 ENCOUNTER — Other Ambulatory Visit: Payer: Self-pay | Admitting: Infectious Disease

## 2018-09-17 DIAGNOSIS — A549 Gonococcal infection, unspecified: Secondary | ICD-10-CM

## 2018-09-19 ENCOUNTER — Ambulatory Visit: Payer: Self-pay | Admitting: Infectious Disease

## 2018-09-25 ENCOUNTER — Encounter: Payer: Self-pay | Admitting: Infectious Disease

## 2018-10-14 ENCOUNTER — Other Ambulatory Visit: Payer: Self-pay

## 2018-10-14 ENCOUNTER — Encounter (HOSPITAL_COMMUNITY): Payer: Self-pay | Admitting: Emergency Medicine

## 2018-10-14 ENCOUNTER — Emergency Department (HOSPITAL_COMMUNITY)
Admission: EM | Admit: 2018-10-14 | Discharge: 2018-10-14 | Disposition: A | Discharge status: Home / Self Care | Payer: No Typology Code available for payment source | Attending: Emergency Medicine | Admitting: Emergency Medicine

## 2018-10-14 DIAGNOSIS — Z79899 Other long term (current) drug therapy: Secondary | ICD-10-CM | POA: Insufficient documentation

## 2018-10-14 DIAGNOSIS — M549 Dorsalgia, unspecified: Secondary | ICD-10-CM | POA: Diagnosis not present

## 2018-10-14 DIAGNOSIS — Z87891 Personal history of nicotine dependence: Secondary | ICD-10-CM | POA: Diagnosis not present

## 2018-10-14 DIAGNOSIS — Z21 Asymptomatic human immunodeficiency virus [HIV] infection status: Secondary | ICD-10-CM | POA: Insufficient documentation

## 2018-10-14 DIAGNOSIS — M542 Cervicalgia: Secondary | ICD-10-CM | POA: Insufficient documentation

## 2018-10-14 DIAGNOSIS — R51 Headache: Secondary | ICD-10-CM | POA: Insufficient documentation

## 2018-10-14 DIAGNOSIS — I10 Essential (primary) hypertension: Secondary | ICD-10-CM | POA: Insufficient documentation

## 2018-10-14 DIAGNOSIS — M62838 Other muscle spasm: Secondary | ICD-10-CM | POA: Insufficient documentation

## 2018-10-14 MED ORDER — CYCLOBENZAPRINE HCL 10 MG PO TABS
10.0000 mg | ORAL_TABLET | Freq: Once | ORAL | Status: AC
  Administered 2018-10-14: 10 mg via ORAL
  Filled 2018-10-14: qty 1

## 2018-10-14 MED ORDER — CYCLOBENZAPRINE HCL 10 MG PO TABS: 10.0000 mg | ORAL_TABLET | Freq: Two times a day (BID) | ORAL | 0 refills | Status: DC | PRN

## 2018-10-14 MED ORDER — IBUPROFEN 200 MG PO TABS
600.0000 mg | ORAL_TABLET | Freq: Once | ORAL | Status: AC
  Administered 2018-10-14: 600 mg via ORAL
  Filled 2018-10-14: qty 3

## 2018-10-14 MED ORDER — NAPROXEN 500 MG PO TABS: 500.0000 mg | ORAL_TABLET | Freq: Two times a day (BID) | ORAL | 0 refills | Status: DC

## 2018-10-14 NOTE — Discharge Instructions (Addendum)
Do not take the muscle relaxer if driving as it will make you sleepy. Follow up with your doctor or return here as needed for worsening symptoms.  °
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
## 2018-10-17 ENCOUNTER — Encounter: Payer: Self-pay | Admitting: Infectious Disease

## 2018-10-17 ENCOUNTER — Ambulatory Visit (INDEPENDENT_AMBULATORY_CARE_PROVIDER_SITE_OTHER): Payer: Self-pay | Admitting: Infectious Disease

## 2018-10-17 VITALS — BP 160/98 | Ht 73.0 in

## 2018-10-17 DIAGNOSIS — B20 Human immunodeficiency virus [HIV] disease: Secondary | ICD-10-CM

## 2018-10-17 DIAGNOSIS — M545 Low back pain, unspecified: Secondary | ICD-10-CM

## 2018-10-17 DIAGNOSIS — A549 Gonococcal infection, unspecified: Secondary | ICD-10-CM

## 2018-10-17 DIAGNOSIS — I10 Essential (primary) hypertension: Secondary | ICD-10-CM

## 2018-10-17 MED ORDER — DOLUTEGRAVIR-LAMIVUDINE 50-300 MG PO TABS: 1.0000 | ORAL_TABLET | Freq: Every day | ORAL | 11 refills | Status: DC

## 2018-10-17 MED ORDER — AMLODIPINE BESYLATE 10 MG PO TABS: ORAL_TABLET | ORAL | 11 refills | Status: DC

## 2018-10-17 MED ORDER — HYDROCHLOROTHIAZIDE 25 MG PO TABS: 25.0000 mg | ORAL_TABLET | Freq: Every day | ORAL | 11 refills | Status: DC
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
## 2018-12-11 ENCOUNTER — Telehealth: Payer: Self-pay | Admitting: Infectious Disease

## 2018-12-11 NOTE — Telephone Encounter (Signed)
COVID-19 Pre-Screening Questions: ° °Do you currently have a fever (>100 °F), chills or unexplained body aches? no  ° °Are you currently experiencing new cough, shortness of breath, sore throat, runny nose?no  °•  °Have you recently travelled outside the state of [place] in the last 14 days? no °•  °Have you been in contact with someone that is currently pending confirmation of Covid19 testing or has been confirmed to have the Covid19 virus? no °

## 2018-12-12 ENCOUNTER — Other Ambulatory Visit: Payer: Self-pay

## 2018-12-12 DIAGNOSIS — I10 Essential (primary) hypertension: Secondary | ICD-10-CM

## 2018-12-12 DIAGNOSIS — B20 Human immunodeficiency virus [HIV] disease: Secondary | ICD-10-CM

## 2018-12-12 DIAGNOSIS — A549 Gonococcal infection, unspecified: Secondary | ICD-10-CM

## 2018-12-13 LAB — MICROALBUMIN / CREATININE URINE RATIO
Creatinine, Urine: 367 mg/dL — ABNORMAL HIGH (ref 20–320)
Microalb Creat Ratio: 19 ug/mg{creat}
Microalb, Ur: 7 mg/dL

## 2018-12-13 LAB — T-HELPER CELL (CD4) - (RCID CLINIC ONLY)
CD4 % Helper T Cell: 34 % (ref 33–55)
CD4 T Cell Abs: 960 /uL (ref 400–2700)

## 2018-12-15 LAB — CBC WITH DIFFERENTIAL/PLATELET
Absolute Monocytes: 530 cells/uL (ref 200–950)
Basophils Absolute: 68 cells/uL (ref 0–200)
Basophils Relative: 1.2 %
Eosinophils Absolute: 80 cells/uL (ref 15–500)
Eosinophils Relative: 1.4 %
HCT: 42.9 % (ref 38.5–50.0)
Hemoglobin: 14.6 g/dL (ref 13.2–17.1)
Lymphs Abs: 2753 cells/uL (ref 850–3900)
MCH: 31.6 pg (ref 27.0–33.0)
MCHC: 34 g/dL (ref 32.0–36.0)
MCV: 92.9 fL (ref 80.0–100.0)
MPV: 10 fL (ref 7.5–12.5)
Monocytes Relative: 9.3 %
Neutro Abs: 2269 cells/uL (ref 1500–7800)
Neutrophils Relative %: 39.8 %
Platelets: 307 10*3/uL (ref 140–400)
RBC: 4.62 10*6/uL (ref 4.20–5.80)
RDW: 11.1 % (ref 11.0–15.0)
Total Lymphocyte: 48.3 %
WBC: 5.7 10*3/uL (ref 3.8–10.8)

## 2018-12-15 LAB — COMPLETE METABOLIC PANEL WITH GFR
AG Ratio: 1.2 (calc) (ref 1.0–2.5)
ALT: 16 U/L (ref 9–46)
AST: 19 U/L (ref 10–40)
Albumin: 4.2 g/dL (ref 3.6–5.1)
Alkaline phosphatase (APISO): 69 U/L (ref 36–130)
BUN: 14 mg/dL (ref 7–25)
CO2: 30 mmol/L (ref 20–32)
Calcium: 9.9 mg/dL (ref 8.6–10.3)
Chloride: 102 mmol/L (ref 98–110)
Creat: 1.12 mg/dL (ref 0.60–1.35)
GFR, Est African American: 94 mL/min/{1.73_m2} (ref >=60)
GFR, Est Non African American: 81 mL/min/{1.73_m2} (ref >=60)
Globulin: 3.4 g/dL (calc) (ref 1.9–3.7)
Glucose, Bld: 134 mg/dL — ABNORMAL HIGH (ref 65–99)
Potassium: 4.1 mmol/L (ref 3.5–5.3)
Sodium: 139 mmol/L (ref 135–146)
Total Bilirubin: 0.7 mg/dL (ref 0.2–1.2)
Total Protein: 7.6 g/dL (ref 6.1–8.1)

## 2018-12-15 LAB — HIV-1 RNA QUANT-NO REFLEX-BLD
HIV 1 RNA Quant: <20 copies/mL — AB
HIV-1 RNA Quant, Log: <1.3 Log copies/mL — AB

## 2018-12-15 LAB — RPR: RPR Ser Ql: NONREACTIVE

## 2018-12-26 ENCOUNTER — Other Ambulatory Visit: Payer: Self-pay

## 2018-12-26 ENCOUNTER — Ambulatory Visit: Payer: Self-pay | Admitting: Infectious Disease

## 2018-12-26 NOTE — Progress Notes (Signed)
Patient did not answer phone

## 2019-01-07 ENCOUNTER — Ambulatory Visit (INDEPENDENT_AMBULATORY_CARE_PROVIDER_SITE_OTHER): Payer: Self-pay | Admitting: Infectious Disease

## 2019-01-07 ENCOUNTER — Other Ambulatory Visit: Payer: Self-pay

## 2019-01-07 ENCOUNTER — Encounter: Payer: Self-pay | Admitting: Infectious Disease

## 2019-01-07 DIAGNOSIS — K219 Gastro-esophageal reflux disease without esophagitis: Secondary | ICD-10-CM

## 2019-01-07 DIAGNOSIS — I10 Essential (primary) hypertension: Secondary | ICD-10-CM

## 2019-01-07 DIAGNOSIS — B20 Human immunodeficiency virus [HIV] disease: Secondary | ICD-10-CM

## 2019-01-07 HISTORY — DX: Gastro-esophageal reflux disease without esophagitis: K21.9

## 2019-01-07 NOTE — Progress Notes (Signed)
Virtual Visit via Telephone Note  I connected with Cody Barton on 01/07/19 at  8:45 AM EDT by telephone and verified that I am speaking with the correct person using two identifiers.  Location: Patient: Home Provider: [facility]   I discussed the limitations, risks, security and privacy concerns of performing an evaluation and management service by telephone and the availability of in person appointments. I also discussed with the patient that there may be a patient responsible charge related to this service. The patient expressed understanding and agreed to proceed.   History of Present Illness:  This is a 43 year old African-American man living with HIV that is perfectly controlled after switch to Dovato.  He does have comorbid hypertension and gastroesophageal reflux disease.  When I called him today to discuss his case he was quite happy with his new medication happy remains virologically suppressed.  We reviewed all of his laboratory data that together.  We also reviewed methods of optimizing sheltering in place to avoid novel coronavirus 2019.     Observations/Objective:  HIV disease: Doing fantastically well on Dovato.  Will schedule him an appointment with same day labs and have done so  Hypertension   Assessment and Plan:  HIV disease doing great we will recheck schedule his labs for same day visit with me  Hypertension continue his amlodipine and hydrochlorothiazide  Esophageal reflux disease continue Prilosec  Follow Up Instructions:    I discussed the assessment and treatment plan with the patient. The patient was provided an opportunity to ask questions and all were answered. The patient agreed with the plan and demonstrated an understanding of the instructions.   The patient was advised to call back or seek an in-person evaluation if the symptoms worsen or if the condition fails to improve as anticipated.  I provided  22 minutes of non-face-to-face  time during this encounter.   Acey Lav, MD

## 2019-01-19 ENCOUNTER — Encounter (HOSPITAL_COMMUNITY): Payer: Self-pay

## 2019-01-19 ENCOUNTER — Ambulatory Visit (HOSPITAL_COMMUNITY)
Admission: EM | Admit: 2019-01-19 | Discharge: 2019-01-19 | Disposition: A | Discharge status: Home / Self Care | Payer: Self-pay | Attending: Family Medicine | Admitting: Family Medicine

## 2019-01-19 ENCOUNTER — Emergency Department (HOSPITAL_COMMUNITY)
Admission: EM | Admit: 2019-01-19 | Discharge: 2019-01-19 | Disposition: A | Discharge status: Home / Self Care | Payer: Self-pay | Attending: Emergency Medicine | Admitting: Emergency Medicine

## 2019-01-19 ENCOUNTER — Other Ambulatory Visit: Payer: Self-pay

## 2019-01-19 DIAGNOSIS — Z20828 Contact with and (suspected) exposure to other viral communicable diseases: Secondary | ICD-10-CM | POA: Insufficient documentation

## 2019-01-19 DIAGNOSIS — Z20822 Contact with and (suspected) exposure to covid-19: Secondary | ICD-10-CM

## 2019-01-19 DIAGNOSIS — J020 Streptococcal pharyngitis: Secondary | ICD-10-CM | POA: Insufficient documentation

## 2019-01-19 DIAGNOSIS — B20 Human immunodeficiency virus [HIV] disease: Secondary | ICD-10-CM | POA: Insufficient documentation

## 2019-01-19 DIAGNOSIS — R6889 Other general symptoms and signs: Secondary | ICD-10-CM

## 2019-01-19 DIAGNOSIS — R110 Nausea: Secondary | ICD-10-CM | POA: Insufficient documentation

## 2019-01-19 DIAGNOSIS — I10 Essential (primary) hypertension: Secondary | ICD-10-CM | POA: Insufficient documentation

## 2019-01-19 DIAGNOSIS — R070 Pain in throat: Secondary | ICD-10-CM | POA: Insufficient documentation

## 2019-01-19 LAB — GROUP A STREP BY PCR: Group A Strep by PCR: DETECTED — AB

## 2019-01-19 MED ORDER — ACETAMINOPHEN 500 MG PO TABS
1000.0000 mg | ORAL_TABLET | Freq: Once | ORAL | Status: AC
  Administered 2019-01-19: 1000 mg via ORAL
  Filled 2019-01-19: qty 2

## 2019-01-19 MED ORDER — PENICILLIN G BENZATHINE 1200000 UNIT/2ML IM SUSP
1.2000 10*6.[IU] | Freq: Once | INTRAMUSCULAR | Status: AC
  Administered 2019-01-19: 1.2 10*6.[IU] via INTRAMUSCULAR
  Filled 2019-01-19: qty 2

## 2019-01-19 NOTE — ED Provider Notes (Signed)
[facility]    CSN: [id number] Arrival date & time: 01/19/19  1609     History   Chief Complaint Chief Complaint  Patient presents with  . Nausea    HPI Cody Barton is a 43 y.o. male.   Cody Barton presents with complaints of nausea, chills, fever. Started yesterday. Got nails done at the salon and started to feel bad. Low energy. Thought he was working too hard. Woke up with chills last night. Very fatigued. Nausea, no vomiting. No diarrhea. No abdominal pain. Chest tightness. No cough. No runny nose. Sore throat present. No ear pain. Works at an [employer] which recently reopened. Hasn't taken any medications for symptoms. Hx of gerd, HIV, htn. 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     ROS per HPI, negative if not otherwise mentioned.      Past Medical History:  Diagnosis Date  . GERD (gastroesophageal reflux disease) 01/07/2019  . Gonorrhea   . HIV (human immunodeficiency virus infection) (HCC)   . Hypertension   . Major depressive disorder, single episode 04/19/2016  . Otitis media 12/16/2014  . Seasonal allergies 12/16/2014  . Sore throat 12/16/2014    Patient Active Problem List   Diagnosis Date Noted  . GERD (gastroesophageal reflux disease) 01/07/2019  . Healthcare maintenance 06/12/2018  . Elevated serum creatinine 06/12/2018  . Major depressive disorder, single episode 04/19/2016  . Otitis media 12/16/2014  . Seasonal allergies 12/16/2014  . HIV disease (HCC) 10/10/2012  . HTN (hypertension) 10/10/2012    History reviewed. No pertinent surgical history.     Home Medications    Prior to Admission medications   Medication Sig Start Date End Date Taking? Authorizing Provider  amLODipine (NORVASC) 10 MG tablet TAKE 1 TABLET(10 MG) BY MOUTH DAILY 10/17/18  Yes Daiva Eves, Lisette Grinder, MD  Dolutegravir-lamiVUDine (DOVATO) 50-300 MG TABS Take 1 tablet by mouth daily. 10/17/18  Yes Daiva Eves, Lisette Grinder, MD   hydrochlorothiazide (HYDRODIURIL) 25 MG tablet Take 1 tablet (25 mg total) by mouth daily. 10/17/18  Yes Daiva Eves, Lisette Grinder, MD  cyclobenzaprine (FLEXERIL) 10 MG tablet Take 1 tablet (10 mg total) by mouth 2 (two) times daily as needed for muscle spasms. Patient not taking: Reported on 01/07/2019 10/14/18   Janne Napoleon, NP  multivitamin-iron-minerals-folic acid (CENTRUM) chewable tablet Chew 1 tablet by mouth daily.    [provider]  omeprazole (PRILOSEC) 20 MG capsule Take 1 capsule (20 mg total) by mouth daily. Patient not taking: Reported on 01/07/2019 12/22/17   Eustace Moore, MD    Family History Family History  Problem Relation Age of Onset  . Hypertension Father   . Cancer Maternal Grandfather     Social History Social History   Tobacco Use  . Smoking status: Former Smoker    Packs/day: 0.30    Years: 1.50    Pack years: 0.45    Types: Cigarettes    Last attempt to quit: 08/21/2017    Years since quitting: 1.4  . Smokeless tobacco: Never Used  . Tobacco comment:  4-6 day  Substance Use Topics  . Alcohol use: Yes    Alcohol/week: 0.0 standard drinks    Comment:    weekend  . Drug use: Yes    Frequency: 3.0 times per week    Types: Marijuana    Comment: daily use      Allergies   Patient has no known allergies.   Review of Systems Review of Systems  Physical Exam Triage Vital Signs ED Triage Vitals  Enc Vitals Group     BP 01/19/19 1700 (!) 147/86     Pulse Rate 01/19/19 1700 (!) 108     Resp 01/19/19 1700 18     Temp 01/19/19 1700 (!) 102.8 F (39.3 C)     Temp src --      SpO2 01/19/19 1700 95 %     Weight --      Height --      Head Circumference --      Peak Flow --      Pain Score 01/19/19 1702 8     Pain Loc --      Pain Edu? --      Excl. in GC? --    No data found.  Updated Vital Signs BP (!) 147/86   Pulse (!) 108   Temp (!) 102.8 F (39.3 C)   Resp 18   SpO2 95%    Physical Exam Vitals signs reviewed.   Constitutional:      Appearance: Normal appearance.  HENT:     Head: Normocephalic and atraumatic.  Pulmonary:     Effort: Pulmonary effort is normal.  Skin:    General: Skin is dry.  Neurological:     General: No focal deficit present.     Mental Status: He is alert.    Limited physical exam at this time with covid precautions   UC Treatments / Results  Labs (all labs ordered are listed, but only abnormal results are displayed) Labs Reviewed - No data to display  EKG None  Radiology No results found.  Procedures Procedures (including critical care time)  Medications Ordered in UC Medications - No data to display  Initial Impression / Assessment and Plan / UC Course  I have reviewed the triage vital signs and the nursing notes.  Pertinent labs & imaging results that were available during my care of the patient were reviewed by me and considered in my medical decision making (see chart for details).     hiv patient with new onset of fever with chest tightness, fatigue, sore throat. hiv is well managed, but with current covid-19 pandemic do recommend thorough evaluation in the er at this time. Patient verbalized understanding and agreeable to plan.   Final Clinical Impressions(s) / UC Diagnoses   Final diagnoses:  Suspected Covid-19 Virus Infection     Discharge Instructions     Please go to the ER for further evaluation of your fever and symptoms.     ED Prescriptions    None     Controlled Substance Prescriptions [place] Controlled Substance Registry consulted? Not Applicable   Georgetta Haber, NP 01/19/19 [phone]

## 2019-01-19 NOTE — Discharge Instructions (Signed)
Please go to the ER for further evaluation of your fever and symptoms.

## 2019-01-19 NOTE — ED Provider Notes (Addendum)
[facility] Provider Note   CSN: [id number] Arrival date & time: 01/19/19  1731    History   Chief Complaint Chief Complaint  Patient presents with  . Fever    HPI Cody Barton is a 43 y.o. male resenting for evaluation of fever.  Patient states yesterday evening he started to feel hot and cold.  He developed a sore throat and some associated nausea.  He denies ear pain, nasal congestion, cough, shortness of breath, vomiting, abdominal pain, urinary symptoms, abnormal bowel movements.  Patient states he has intermittent episodes of chest burning, feels like if he eats too many tomatoes.  No chest burning currently.  He reports a history of HIV for which he takes medication, his last test showed an undetectable viral load.  He has no other medical problems.  He has not taken anything for his symptoms including Tylenol or ibuprofen.  He denies contact with known COVID-19 positive person.  No one is sick at home, although he does live with his mom and grandma.  He works at [employer] which recently re-opened.     HPI  Past Medical History:  Diagnosis Date  . GERD (gastroesophageal reflux disease) 01/07/2019  . Gonorrhea   . HIV (human immunodeficiency virus infection) (HCC)   . Hypertension   . Major depressive disorder, single episode 04/19/2016  . Otitis media 12/16/2014  . Seasonal allergies 12/16/2014  . Sore throat 12/16/2014    Patient Active Problem List   Diagnosis Date Noted  . GERD (gastroesophageal reflux disease) 01/07/2019  . Healthcare maintenance 06/12/2018  . Elevated serum creatinine 06/12/2018  . Major depressive disorder, single episode 04/19/2016  . Otitis media 12/16/2014  . Seasonal allergies 12/16/2014  . HIV disease (HCC) 10/10/2012  . HTN (hypertension) 10/10/2012    History reviewed. No pertinent surgical history.      Home Medications    Prior to Admission medications   Medication Sig Start Date  End Date Taking? Authorizing Provider  amLODipine (NORVASC) 10 MG tablet TAKE 1 TABLET(10 MG) BY MOUTH DAILY 10/17/18   Daiva EvesVan Dam, Lisette Grinderornelius N, MD  cyclobenzaprine (FLEXERIL) 10 MG tablet Take 1 tablet (10 mg total) by mouth 2 (two) times daily as needed for muscle spasms. Patient not taking: Reported on 01/07/2019 10/14/18   Janne NapoleonNeese, Hope M, NP  Dolutegravir-lamiVUDine (DOVATO) 50-300 MG TABS Take 1 tablet by mouth daily. 10/17/18   Randall HissVan Dam, Cornelius N, MD  hydrochlorothiazide (HYDRODIURIL) 25 MG tablet Take 1 tablet (25 mg total) by mouth daily. 10/17/18   Randall HissVan Dam, Cornelius N, MD  multivitamin-iron-minerals-folic acid (CENTRUM) chewable tablet Chew 1 tablet by mouth daily.    [provider]  omeprazole (PRILOSEC) 20 MG capsule Take 1 capsule (20 mg total) by mouth daily. Patient not taking: Reported on 01/07/2019 12/22/17   Eustace MooreNelson, Yvonne Sue, MD    Family History Family History  Problem Relation Age of Onset  . Hypertension Father   . Cancer Maternal Grandfather     Social History Social History   Tobacco Use  . Smoking status: Former Smoker    Packs/day: 0.30    Years: 1.50    Pack years: 0.45    Types: Cigarettes    Last attempt to quit: 08/21/2017    Years since quitting: 1.4  . Smokeless tobacco: Never Used  . Tobacco comment:  4-6 day  Substance Use Topics  . Alcohol use: Yes    Alcohol/week: 0.0 standard drinks    Comment:  weekend  . Drug use: Yes    Frequency: 3.0 times per week    Types: Marijuana    Comment: daily use      Allergies   Patient has no known allergies.   Review of Systems Review of Systems  Constitutional: Positive for fever.  HENT: Positive for sore throat.   Gastrointestinal: Positive for nausea.  All other systems reviewed and are negative.    Physical Exam Updated Vital Signs BP (!) 136/95 (BP Location: Right Arm)   Pulse (!) 106   Temp (!) 100.7 F (38.2 C)   Resp 16   SpO2 96%   Physical Exam Vitals signs and nursing  note reviewed.  Constitutional:      General: He is not in acute distress.    Appearance: He is well-developed.     Comments: Appears nontoxic  HENT:     Head: Normocephalic and atraumatic.     Comments: OP mildly erythematous.  Bilateral tonsillar swelling with exudate.  Uvula midline with equal palate rise.  Handling secretions easily.  No muffled voice.  No trismus.    Mouth/Throat:     Lips: Pink.     Mouth: Mucous membranes are moist.     Pharynx: Uvula midline. Posterior oropharyngeal erythema present.     Tonsils: Tonsillar exudate present. 3+ on the right. 3+ on the left.  Neck:     Musculoskeletal: Normal range of motion.  Cardiovascular:     Rate and Rhythm: Regular rhythm. Tachycardia present.     Pulses: Normal pulses.     Comments: Mild tachycardia around 105 Pulmonary:     Effort: Pulmonary effort is normal. No respiratory distress.     Breath sounds: Normal breath sounds. No wheezing or rales.     Comments: Speaking in full sentences.  Clear lung sounds in all fields. Abdominal:     General: Abdomen is flat. There is no distension.     Palpations: There is no mass.     Tenderness: There is no abdominal tenderness. There is no guarding or rebound.  Musculoskeletal: Normal range of motion.  Skin:    General: Skin is warm.     Capillary Refill: Capillary refill takes less than 2 seconds.     Findings: No rash.  Neurological:     Mental Status: He is alert and oriented to person, place, and time.      ED Treatments / Results  Labs (all labs ordered are listed, but only abnormal results are displayed) Labs Reviewed  GROUP A STREP BY PCR - Abnormal; Notable for the following components:      Result Value   Group A Strep by PCR DETECTED (*)    All other components within normal limits  NOVEL CORONAVIRUS, NAA (HOSPITAL ORDER, SEND-OUT TO REF LAB)    EKG None  Radiology No results found.  Procedures Procedures (including critical care time)  Medications  Ordered in ED Medications  penicillin g benzathine (BICILLIN LA) 1200000 UNIT/2ML injection 1.2 Million Units (has no administration in time range)  acetaminophen (TYLENOL) tablet 1,000 mg (1,000 mg Oral Given 01/19/19 1757)     Initial Impression / Assessment and Plan / ED Course  I have reviewed the triage vital signs and the nursing notes.  Pertinent labs & imaging results that were available during my care of the patient were reviewed by me and considered in my medical decision making (see chart for details).        Pt presenting for evaluation of  fever, nausea, and sore throat.  Physical exam concerning for strep, as patient has tonsillar swelling with exudate.  Will order strep swab.  However, as he has an undifferentiated fever, will also send COVID swab.  Tylenol for fever.  Strep positive.  Discussed findings with patient.  Will give Bicillin shot.  Discussed that COVID test are pending.  Encourage quarantine until test has returned.  At this time, patient appears safe for discharge.  Return precautions given.  Patient states he understands and agrees to plan.  Cody Barton was evaluated in Emergency Department on 01/19/2019 for the symptoms described in the history of present illness. He was evaluated in the context of the global COVID-19 pandemic, which necessitated consideration that the patient might be at risk for infection with the SARS-CoV-2 virus that causes COVID-19. Institutional protocols and algorithms that pertain to the evaluation of patients at risk for COVID-19 are in a state of rapid change based on information released by regulatory bodies including the CDC and federal and state organizations. These policies and algorithms were followed during the patient's care in the ED.    Final Clinical Impressions(s) / ED Diagnoses   Final diagnoses:  Strep pharyngitis    ED Discharge Orders    None       Alveria Apley, PA-C 01/19/19 1903    Alveria Apley, PA-C 01/19/19 1903    Melene Plan, DO 01/19/19 2009

## 2019-01-19 NOTE — Discharge Instructions (Addendum)
Your strep test was positive.  You were treated in the ER, you do not need any further antibiotics.  You are contagious while you are still having fevers, especially in the first 24 hours. You are also tested for coronavirus today.  If the results are positive, you will need to be out of work and quarantine for a week and to be fever free for 72 hours before he can return. If the results are negative, you should be able to return to work as long as you are fever free. You should get a phone call if it is positive.  You may not get a phone call if it is negative.  Either way, you may check online on MyChart. Continue to treat your symptoms symptomatically, Tylenol and ibuprofen for fever, body aches, and sore throat.  Make sure you stay well-hydrated water.  Follow-up with your primary care doctor as needed for further evaluation. Return to the emergency room if you develop difficulty breathing, severe worsening symptoms, or any new or concerning symptoms.

## 2019-01-19 NOTE — ED Triage Notes (Signed)
Onset yesterday sweats, sore throat intermittant mid chest pain feels like "gas burp".

## 2019-01-19 NOTE — ED Triage Notes (Signed)
Pt states he began feeling nauseated yetserday  Without vomiting and also admits to having chills

## 2019-01-21 LAB — NOVEL CORONAVIRUS, NAA (HOSP ORDER, SEND-OUT TO REF LAB; TAT 18-24 HRS): SARS-CoV-2, NAA: NOT DETECTED

## 2019-04-08 ENCOUNTER — Encounter: Payer: Self-pay | Admitting: Infectious Disease

## 2019-04-08 ENCOUNTER — Other Ambulatory Visit: Payer: Self-pay

## 2019-04-08 ENCOUNTER — Ambulatory Visit (INDEPENDENT_AMBULATORY_CARE_PROVIDER_SITE_OTHER): Payer: Self-pay | Admitting: Infectious Disease

## 2019-04-08 VITALS — BP 142/89 | HR 83 | Temp 98.0°F

## 2019-04-08 DIAGNOSIS — B20 Human immunodeficiency virus [HIV] disease: Secondary | ICD-10-CM

## 2019-04-08 DIAGNOSIS — R0789 Other chest pain: Secondary | ICD-10-CM

## 2019-04-08 DIAGNOSIS — K219 Gastro-esophageal reflux disease without esophagitis: Secondary | ICD-10-CM

## 2019-04-08 DIAGNOSIS — I10 Essential (primary) hypertension: Secondary | ICD-10-CM

## 2019-04-08 HISTORY — DX: Other chest pain: R07.89

## 2019-04-08 NOTE — Progress Notes (Signed)
Subjective:   Chief complaint: substernal chest pain   Patient ID: Cody Barton, male    DOB: 1976/05/05, 43 y.o.   MRN: [id number]  HPI  43 year-old African-American with HIV infection acquired infection in whom we had started Complera and who has had perfect virological suppression. He admitted that he sometimes was taking the complera on an empty stomach which I said was absolutely a bad idea and we  [facility] and he has maintained perfect virological suppression since then.   [facility] and he is done quite well taking this medication and is here for blood work for that switch.  He does note that he is been having chest pain for the last month which comes on and off.  He describes as a sharp pain rates it about 4 out of 10 in intensity.  It typically lasts for about 25 seconds he states.  Last night however it was sufficiently severe that he states that he threw up although he states he typically does not usually have nausea associated with it.  He denies having diaphoresis with it he denies that the pain radiates to her to his shoulder it seems to get better with time.  He cannot find something specific that exacerbates it or bring it on.  It is not made worse by deep breathing.  He has stopped smoking cigarettes but does continue to smoke marijuana.  He denies being anxious at present.  He says at times he has taken Tums for this in the past but this does not seem to relieve the pain very much though again according to his story it does not last very long.  .    Past Medical History:  Diagnosis Date  . GERD (gastroesophageal reflux disease) 01/07/2019  . Gonorrhea   . HIV (human immunodeficiency virus infection) (HCC)   . Hypertension   . Major depressive disorder, single episode 04/19/2016  . Otitis media 12/16/2014  . Seasonal allergies 12/16/2014  . Sore throat 12/16/2014    No past surgical history on file.  Family History   Problem Relation Age of Onset  . Hypertension Father   . Cancer Maternal Grandfather       Social History   Socioeconomic History  . Marital status: Single    Spouse name: Not on file  . Number of children: Not on file  . Years of education: Not on file  . Highest education level: Not on file  Occupational History  . Not on file  Social Needs  . Financial resource strain: Not on file  . Food insecurity    Worry: Not on file    Inability: Not on file  . Transportation needs    Medical: Not on file    Non-medical: Not on file  Tobacco Use  . Smoking status: Former Smoker    Packs/day: 0.30    Years: 1.50    Pack years: 0.45    Types: Cigarettes    Quit date: 08/21/2017    Years since quitting: 1.6  . Smokeless tobacco: Never Used  . Tobacco comment:  4-6 day  Substance and Sexual Activity  . Alcohol use: Yes    Alcohol/week: 0.0 standard drinks    Comment:    weekend  . Drug use: Yes    Frequency: 3.0 times per week    Types: Marijuana    Comment: daily use   . Sexual activity: Yes    Partners: Male    Birth  control/protection: None  Lifestyle  . Physical activity    Days per week: Not on file    Minutes per session: Not on file  . Stress: Not on file  Relationships  . Social Musicianconnections    Talks on phone: Not on file    Gets together: Not on file    Attends religious service: Not on file    Active member of club or organization: Not on file    Attends meetings of clubs or organizations: Not on file    Relationship status: Not on file  Other Topics Concern  . Not on file  Social History Narrative   ** Merged History Encounter **       ** Merged History Encounter **        No Known Allergies   Current Outpatient Medications:  .  amLODipine (NORVASC) 10 MG tablet, TAKE 1 TABLET(10 MG) BY MOUTH DAILY, Disp: 30 tablet, Rfl: 11 .  Dolutegravir-lamiVUDine (DOVATO) 50-300 MG TABS, Take 1 tablet by mouth daily., Disp: 30 tablet, Rfl: 11 .   hydrochlorothiazide (HYDRODIURIL) 25 MG tablet, Take 1 tablet (25 mg total) by mouth daily., Disp: 30 tablet, Rfl: 11 .  multivitamin-iron-minerals-folic acid (CENTRUM) chewable tablet, Chew 1 tablet by mouth daily., Disp: , Rfl:  .  Omega-3 Fatty Acids (FISH OIL) 1200 MG CPDR, Take by mouth., Disp: , Rfl:  .  cyclobenzaprine (FLEXERIL) 10 MG tablet, Take 1 tablet (10 mg total) by mouth 2 (two) times daily as needed for muscle spasms. (Patient not taking: Reported on 01/07/2019), Disp: 20 tablet, Rfl: 0 .  omeprazole (PRILOSEC) 20 MG capsule, Take 1 capsule (20 mg total) by mouth daily. (Patient not taking: Reported on 01/07/2019), Disp: 30 capsule, Rfl: 0  Lab Results  Component Value Date   HIV1RNAQUANT <20 DETECTED (A) 12/12/2018   HIV1RNAQUANT <20 DETECTED (A) 09/04/2018   HIV1RNAQUANT <20 DETECTED (A) 05/29/2018      Lab Results  Component Value Date   CD4TABS 960 12/12/2018   CD4TABS 800 09/04/2018   CD4TABS 860 05/29/2018   Past Medical History:  Diagnosis Date  . GERD (gastroesophageal reflux disease) 01/07/2019  . Gonorrhea   . HIV (human immunodeficiency virus infection) (HCC)   . Hypertension   . Major depressive disorder, single episode 04/19/2016  . Otitis media 12/16/2014  . Seasonal allergies 12/16/2014  . Sore throat 12/16/2014    No past surgical history on file.  Family History  Problem Relation Age of Onset  . Hypertension Father   . Cancer Maternal Grandfather       Social History   Socioeconomic History  . Marital status: Single    Spouse name: Not on file  . Number of children: Not on file  . Years of education: Not on file  . Highest education level: Not on file  Occupational History  . Not on file  Social Needs  . Financial resource strain: Not on file  . Food insecurity    Worry: Not on file    Inability: Not on file  . Transportation needs    Medical: Not on file    Non-medical: Not on file  Tobacco Use  . Smoking status: Former Smoker     Packs/day: 0.30    Years: 1.50    Pack years: 0.45    Types: Cigarettes    Quit date: 08/21/2017    Years since quitting: 1.6  . Smokeless tobacco: Never Used  . Tobacco comment:  4-6 day  Substance and Sexual  Activity  . Alcohol use: Yes    Alcohol/week: 0.0 standard drinks    Comment:    weekend  . Drug use: Yes    Frequency: 3.0 times per week    Types: Marijuana    Comment: daily use   . Sexual activity: Yes    Partners: Male    Birth control/protection: None  Lifestyle  . Physical activity    Days per week: Not on file    Minutes per session: Not on file  . Stress: Not on file  Relationships  . Social Herbalist on phone: Not on file    Gets together: Not on file    Attends religious service: Not on file    Active member of club or organization: Not on file    Attends meetings of clubs or organizations: Not on file    Relationship status: Not on file  Other Topics Concern  . Not on file  Social History Narrative   ** Merged History Encounter **       ** Merged History Encounter **        No Known Allergies   Current Outpatient Medications:  .  amLODipine (NORVASC) 10 MG tablet, TAKE 1 TABLET(10 MG) BY MOUTH DAILY, Disp: 30 tablet, Rfl: 11 .  Dolutegravir-lamiVUDine (DOVATO) 50-300 MG TABS, Take 1 tablet by mouth daily., Disp: 30 tablet, Rfl: 11 .  hydrochlorothiazide (HYDRODIURIL) 25 MG tablet, Take 1 tablet (25 mg total) by mouth daily., Disp: 30 tablet, Rfl: 11 .  multivitamin-iron-minerals-folic acid (CENTRUM) chewable tablet, Chew 1 tablet by mouth daily., Disp: , Rfl:  .  Omega-3 Fatty Acids (FISH OIL) 1200 MG CPDR, Take by mouth., Disp: , Rfl:  .  cyclobenzaprine (FLEXERIL) 10 MG tablet, Take 1 tablet (10 mg total) by mouth 2 (two) times daily as needed for muscle spasms. (Patient not taking: Reported on 01/07/2019), Disp: 20 tablet, Rfl: 0 .  omeprazole (PRILOSEC) 20 MG capsule, Take 1 capsule (20 mg total) by mouth daily. (Patient not  taking: Reported on 01/07/2019), Disp: 30 capsule, Rfl: 0   Review of Systems  Constitutional: Negative for activity change, appetite change, chills, diaphoresis, fatigue, fever and unexpected weight change.  HENT: Negative for congestion, rhinorrhea, sinus pressure, sneezing, sore throat and trouble swallowing.   Eyes: Negative for photophobia and visual disturbance.  Respiratory: Negative for cough, chest tightness, shortness of breath, wheezing and stridor.   Cardiovascular: Positive for chest pain. Negative for palpitations and leg swelling.  Gastrointestinal: Negative for abdominal distention, abdominal pain, anal bleeding, constipation, diarrhea, nausea and vomiting.  Genitourinary: Negative for difficulty urinating, dysuria, flank pain and hematuria.  Musculoskeletal: Negative for gait problem, joint swelling and myalgias.  Skin: Negative for color change, pallor, rash and wound.  Neurological: Negative for dizziness, tremors, weakness and light-headedness.  Hematological: Negative for adenopathy. Does not bruise/bleed easily.  Psychiatric/Behavioral: Negative for agitation, behavioral problems, confusion, decreased concentration, dysphoric mood and sleep disturbance.       Objective:   Physical Exam  Constitutional: He is oriented to person, place, and time. He appears well-nourished. No distress.  HENT:  Head: Normocephalic and atraumatic.  Right Ear: No drainage, swelling or tenderness. No decreased hearing is noted.  Left Ear: No drainage, swelling or tenderness. No decreased hearing is noted.  Mouth/Throat: Oropharynx is clear and moist. No oropharyngeal exudate.  Eyes: Conjunctivae and EOM are normal. No scleral icterus.  Neck: Normal range of motion. Neck supple.  Cardiovascular: Normal rate and regular  rhythm. Exam reveals no gallop and no friction rub.  No murmur heard. Pulmonary/Chest: Effort normal. No respiratory distress. He has no wheezes. He has no rales. He  exhibits no tenderness.  Abdominal: He exhibits no distension.  Musculoskeletal:        General: No tenderness or edema.     Right shoulder: He exhibits normal range of motion, no tenderness and no bony tenderness.  Neurological: He is alert and oriented to person, place, and time. He exhibits normal muscle tone. Coordination normal.  Skin: Skin is warm and dry. He is not diaphoretic.  Psychiatric: He has a normal mood and affect. His behavior is normal. Judgment and thought content normal.  Nursing note and vitals reviewed.         Assessment & Plan:   Atypical chest pain: I am getting a twelve-lead EKG and will get a troponin.  Of lead EKG was performed and shows him to have some J-point elevation in V2 V3 V4 which was present on EKG in 2013.  There were no other significant changes whatsoever between these 2 EKGs on my review of them.    If this continues we could try to get him referred to cardiology though without his insurance that may not be the simplest thing.  Also can try to admit him to the hospital.  I do not think the pain is sufficiently convincing enough to be likely cardiogenic though.  Main risk factors for cardiac disease would be his blood pressure is prior smoking status and potentially his HIV sero-status though that has not yet been proven to be risk factor for cardiovascular disease he denies having a family history of heart disease    HIV: Check labs on Dovato today  HTN: norvasc 10mg  and hydrochlorothiazide   I spent greater than 40 minutes with the patient including greater than 50% of time in face to face counsel of the patient differential for cardiac chest pain, test to be doing including EKG and including interpretation of 12-lead EKG and in coordination of his care.

## 2019-04-09 LAB — T-HELPER CELL (CD4) - (RCID CLINIC ONLY)
CD4 % Helper T Cell: 34 % (ref 33–65)
CD4 T Cell Abs: 1016 /uL (ref 400–1790)

## 2019-04-10 LAB — URINE CYTOLOGY ANCILLARY ONLY
Chlamydia: NEGATIVE
Neisseria Gonorrhea: NEGATIVE

## 2019-04-11 ENCOUNTER — Encounter: Payer: Self-pay | Admitting: Infectious Disease

## 2019-04-11 LAB — CBC WITH DIFFERENTIAL/PLATELET
Absolute Monocytes: 819 cells/uL (ref 200–950)
Basophils Absolute: 74 cells/uL (ref 0–200)
Basophils Relative: 0.8 %
Eosinophils Absolute: 37 cells/uL (ref 15–500)
Eosinophils Relative: 0.4 %
HCT: 42.5 % (ref 38.5–50.0)
Hemoglobin: 14.3 g/dL (ref 13.2–17.1)
Lymphs Abs: 3570 cells/uL (ref 850–3900)
MCH: 32.1 pg (ref 27.0–33.0)
MCHC: 33.6 g/dL (ref 32.0–36.0)
MCV: 95.3 fL (ref 80.0–100.0)
MPV: 9.7 fL (ref 7.5–12.5)
Monocytes Relative: 8.9 %
Neutro Abs: 4701 cells/uL (ref 1500–7800)
Neutrophils Relative %: 51.1 %
Platelets: 381 10*3/uL (ref 140–400)
RBC: 4.46 10*6/uL (ref 4.20–5.80)
RDW: 11.7 % (ref 11.0–15.0)
Total Lymphocyte: 38.8 %
WBC: 9.2 10*3/uL (ref 3.8–10.8)

## 2019-04-11 LAB — COMPLETE METABOLIC PANEL WITH GFR
AG Ratio: 1.3 (calc) (ref 1.0–2.5)
ALT: 15 U/L (ref 9–46)
AST: 14 U/L (ref 10–40)
Albumin: 4.4 g/dL (ref 3.6–5.1)
Alkaline phosphatase (APISO): 66 U/L (ref 36–130)
BUN: 14 mg/dL (ref 7–25)
CO2: 28 mmol/L (ref 20–32)
Calcium: 10.1 mg/dL (ref 8.6–10.3)
Chloride: 105 mmol/L (ref 98–110)
Creat: 1.09 mg/dL (ref 0.60–1.35)
GFR, Est African American: 97 mL/min/{1.73_m2} (ref >=60)
GFR, Est Non African American: 83 mL/min/{1.73_m2} (ref >=60)
Globulin: 3.5 g/dL (calc) (ref 1.9–3.7)
Glucose, Bld: 89 mg/dL (ref 65–99)
Potassium: 3.8 mmol/L (ref 3.5–5.3)
Sodium: 139 mmol/L (ref 135–146)
Total Bilirubin: 0.8 mg/dL (ref 0.2–1.2)
Total Protein: 7.9 g/dL (ref 6.1–8.1)

## 2019-04-11 LAB — HIV-1 RNA QUANT-NO REFLEX-BLD
HIV 1 RNA Quant: <20 copies/mL
HIV-1 RNA Quant, Log: <1.3 Log copies/mL

## 2019-04-11 LAB — TROPONIN I: Troponin I: <0.01 ng/mL (ref <=0.0)

## 2019-04-11 LAB — RPR: RPR Ser Ql: NONREACTIVE

## 2019-06-30 ENCOUNTER — Emergency Department (HOSPITAL_COMMUNITY): Payer: Self-pay

## 2019-06-30 ENCOUNTER — Encounter (HOSPITAL_COMMUNITY): Payer: Self-pay | Admitting: *Deleted

## 2019-06-30 ENCOUNTER — Other Ambulatory Visit: Payer: Self-pay

## 2019-06-30 ENCOUNTER — Emergency Department (HOSPITAL_COMMUNITY)
Admission: EM | Admit: 2019-06-30 | Discharge: 2019-06-30 | Disposition: A | Discharge status: Home / Self Care | Payer: Self-pay | Attending: Emergency Medicine | Admitting: Emergency Medicine

## 2019-06-30 DIAGNOSIS — M545 Low back pain, unspecified: Secondary | ICD-10-CM

## 2019-06-30 DIAGNOSIS — M62830 Muscle spasm of back: Secondary | ICD-10-CM | POA: Insufficient documentation

## 2019-06-30 DIAGNOSIS — Z87891 Personal history of nicotine dependence: Secondary | ICD-10-CM | POA: Insufficient documentation

## 2019-06-30 DIAGNOSIS — Z79899 Other long term (current) drug therapy: Secondary | ICD-10-CM | POA: Insufficient documentation

## 2019-06-30 DIAGNOSIS — Z21 Asymptomatic human immunodeficiency virus [HIV] infection status: Secondary | ICD-10-CM | POA: Insufficient documentation

## 2019-06-30 DIAGNOSIS — M62838 Other muscle spasm: Secondary | ICD-10-CM

## 2019-06-30 DIAGNOSIS — I10 Essential (primary) hypertension: Secondary | ICD-10-CM | POA: Insufficient documentation

## 2019-06-30 MED ORDER — METHOCARBAMOL 500 MG PO TABS
500.0000 mg | ORAL_TABLET | Freq: Once | ORAL | Status: AC
  Administered 2019-06-30: 09:00:00 500 mg via ORAL
  Filled 2019-06-30: qty 1

## 2019-06-30 MED ORDER — LIDOCAINE 5 % EX PTCH: 1.0000 | MEDICATED_PATCH | CUTANEOUS | 0 refills | Status: DC

## 2019-06-30 MED ORDER — METHOCARBAMOL 500 MG PO TABS: 500.0000 mg | ORAL_TABLET | Freq: Two times a day (BID) | ORAL | 0 refills | Status: DC

## 2019-06-30 NOTE — ED Triage Notes (Signed)
PT called Mother and he has ben taking his Mother's Ultram for pain not a muscle relaxer.

## 2019-06-30 NOTE — ED Provider Notes (Signed)
[facility] EMERGENCY DEPARTMENT Provider Note   CSN: [id number] Arrival date & time: 06/30/19  9147     History   Chief Complaint Chief Complaint  Patient presents with   Back Pain    HPI Cody Barton is a 43 y.o. male with past medical history significant for HIV, undetectable viral load, GERD, hypertension who presents for evaluation of back pain.  Patient states approximately 1 week ago he was at work when he picked up a box.  Patient states he felt immediate pain to his lower thoracic, superior lumbar spine.  Patient states he has been taking ibuprofen and has taken to his mother's Ultram which has significantly helped his pain however has not resolved it.  Patient states pain worse with movement such as twisting and picking up objects.  He denies any radiation of pain into his legs, numbness or tingling, bowel or bladder incontinence, saddle paresthesia, history of malignancy or chronic steroid use.  Patient states "I just one make sure there is nothing really wrong."  He denies fever, chills, nausea, vomiting, chest pain, shortness of breath, abdominal pain, flank pain, hematuria, dysuria, diarrhea, constipation.  Has been able to walk without difficulty.  Patient states his pain is resolved when he lays flat and does not move.  Denies any rashes or lesions.  Denies additional aggravating or alleviating factors.  History obtained from patient and past medical records.  No interpreter is used.     HPI  Past Medical History:  Diagnosis Date   Atypical chest pain 04/08/2019   GERD (gastroesophageal reflux disease) 01/07/2019   Gonorrhea    HIV (human immunodeficiency virus infection) (HCC)    Hypertension    Major depressive disorder, single episode 04/19/2016   Otitis media 12/16/2014   Seasonal allergies 12/16/2014   Sore throat 12/16/2014    Patient Active Problem List   Diagnosis Date Noted   Atypical chest pain 04/08/2019   GERD  (gastroesophageal reflux disease) 01/07/2019   Healthcare maintenance 06/12/2018   Elevated serum creatinine 06/12/2018   Major depressive disorder, single episode 04/19/2016   Otitis media 12/16/2014   Seasonal allergies 12/16/2014   HIV disease (HCC) 10/10/2012   HTN (hypertension) 10/10/2012    History reviewed. No pertinent surgical history.      Home Medications    Prior to Admission medications   Medication Sig Start Date End Date Taking? Authorizing Provider  amLODipine (NORVASC) 10 MG tablet TAKE 1 TABLET(10 MG) BY MOUTH DAILY 10/17/18   Daiva Eves, Lisette Grinder, MD  Dolutegravir-lamiVUDine (DOVATO) 50-300 MG TABS Take 1 tablet by mouth daily. 10/17/18   Randall Hiss, MD  hydrochlorothiazide (HYDRODIURIL) 25 MG tablet Take 1 tablet (25 mg total) by mouth daily. 10/17/18   Randall Hiss, MD  lidocaine (LIDODERM) 5 % Place 1 patch onto the skin daily. Remove & Discard patch within 12 hours or as directed by MD 06/30/19   Spirit Wernli A, PA-C  methocarbamol (ROBAXIN) 500 MG tablet Take 1 tablet (500 mg total) by mouth 2 (two) times daily. 06/30/19   Josph Norfleet A, PA-C  multivitamin-iron-minerals-folic acid (CENTRUM) chewable tablet Chew 1 tablet by mouth daily.    [provider]  Omega-3 Fatty Acids (FISH OIL) 1200 MG CPDR Take by mouth.    [provider]  omeprazole (PRILOSEC) 20 MG capsule Take 1 capsule (20 mg total) by mouth daily. Patient not taking: Reported on 01/07/2019 12/22/17   Eustace Moore, MD  Family History Family History  Problem Relation Age of Onset   Hypertension Father    Cancer Maternal Grandfather     Social History Social History   Tobacco Use   Smoking status: Former Smoker    Packs/day: 0.30    Years: 1.50    Pack years: 0.45    Types: Cigarettes    Quit date: 08/21/2017    Years since quitting: 1.8   Smokeless tobacco: Never Used   Tobacco comment:  4-6 day  Substance Use Topics    Alcohol use: Yes    Alcohol/week: 0.0 standard drinks    Comment:    weekend   Drug use: Yes    Frequency: 3.0 times per week    Types: Marijuana    Comment: daily use      Allergies   Patient has no known allergies.   Review of Systems Review of Systems  Constitutional: Negative.   HENT: Negative.   Respiratory: Negative.   Cardiovascular: Negative.   Gastrointestinal: Negative.   Genitourinary: Negative.   Musculoskeletal: Positive for back pain. Negative for arthralgias, gait problem, joint swelling, myalgias, neck pain and neck stiffness.  Skin: Negative.   Neurological: Negative.   All other systems reviewed and are negative.   Physical Exam Updated Vital Signs BP (!) 143/90 (BP Location: Right Arm)    Pulse 70    Temp 97.9 F (36.6 C) (Oral)    Resp 16    Ht 6\' 1"  (1.854 m)    Wt 90.7 kg    SpO2 99%    BMI 26.39 kg/m   Physical Exam  Physical Exam  Constitutional: Pt appears well-developed and well-nourished. No distress.  HENT:  Head: Normocephalic and atraumatic.  Mouth/Throat: Oropharynx is clear and moist. No oropharyngeal exudate.  Eyes: Conjunctivae are normal.  Neck: Normal range of motion. Neck supple.  Full ROM without pain  Cardiovascular: Normal rate, regular rhythm and intact distal pulses.   Pulmonary/Chest: Effort normal and breath sounds normal. No respiratory distress. Pt has no wheezes.  Abdominal: Soft. Pt exhibits no distension. There is no tenderness, rebound or guarding. No abd bruit or pulsatile mass.negative CVA tap. Musculoskeletal:  Full range of motion of the T-spine and L-spine with flexion, hyperextension, and lateral flexion. No midline tenderness or stepoffs. No tenderness to palpation of the spinous processes of the T-spine or L-spine. Mild tenderness to palpation of the paraspinous muscles of the superior L-spine, inferior thoracic spine around T11-L1. Surround palpable spasm. Negative straight leg raise.  Lymphadenopathy:     Pt has no cervical adenopathy.  Neurological: Pt is alert. Pt has normal reflexes.  Reflex Scores:      Bicep reflexes are 2+ on the right side and 2+ on the left side.      Brachioradialis reflexes are 2+ on the right side and 2+ on the left side.      Patellar reflexes are 2+ on the right side and 2+ on the left side.      Achilles reflexes are 2+ on the right side and 2+ on the left side. Speech is clear and goal oriented, follows commands Normal 5/5 strength in upper and lower extremities bilaterally including dorsiflexion and plantar flexion, strong and equal grip strength Sensation normal to light and sharp touch Moves extremities without ataxia, coordination intact Normal gait Normal balance No Clonus Skin: Skin is warm and dry. No rash noted or lesions noted. Pt is not diaphoretic. No erythema, ecchymosis,edema or warmth.  Psychiatric: Pt has  a normal mood and affect. Behavior is normal.  Nursing note and vitals reviewed. ED Treatments / Results  Labs (all labs ordered are listed, but only abnormal results are displayed) Labs Reviewed - No data to display  EKG None  Radiology Dg Thoracic Spine 2 View  Result Date: 06/30/2019 CLINICAL DATA:  Back pain 1-2 weeks.  No injury. EXAM: THORACIC SPINE 2 VIEWS COMPARISON:  None. FINDINGS: Subtle spondylosis of the thoracic spine. Vertebral body alignment and heights are normal. Pedicles are intact. No compression fracture or subluxation. IMPRESSION: No acute findings. Minimal spondylosis of the thoracic spine. Electronically Signed   By: Elberta Fortis M.D.   On: 06/30/2019 09:41   Dg Lumbar Spine Complete  Result Date: 06/30/2019 CLINICAL DATA:  Low back pain 1-2 weeks.  No injury. EXAM: LUMBAR SPINE - COMPLETE 4+ VIEW COMPARISON:  None. FINDINGS: Vertebral body alignment, heights and disc space heights are normal. There is no evidence of compression fracture or spondylolisthesis. Remainder of the exam is unremarkable. IMPRESSION:  Normal lumbar spine. Electronically Signed   By: Elberta Fortis M.D.   On: 06/30/2019 09:42    Procedures Procedures (including critical care time)  Medications Ordered in ED Medications  methocarbamol (ROBAXIN) tablet 500 mg (500 mg Oral Given 06/30/19 0913)   Initial Impression / Assessment and Plan / ED Course  I have reviewed the triage vital signs and the nursing notes.  Pertinent labs & imaging results that were available during my care of the patient were reviewed by me and considered in my medical decision making (see chart for details).  44 year old male appears otherwise well presents for evaluation of back pain.  Symptoms began approximately 7 to 8 days ago while at work after picking up a box.  He has tenderness at T11-L1.  He has no radicular symptoms.  No fever, chills, bowel or bladder incontinence, saddle paresthesia, history of malignancy or chronic steroid use.  He does have history of HIV however his last labs show undetectable viral load and CD4 count greater than 1000.  Patient states he is compliant with his medications.  He has no urinary symptoms, negative CVA tap bilaterally and abdomen is soft.  I have low suspicion for renal pathology as cause of his back pain.  He does have palpable spasm however has no overlying skin changes, specifically no vesicular changes or evidence of bacterial process.  Has been taking ibuprofen and his mother's Ultram at home which is significantly helped with the pain however patient states "I just want to make sure there is nothing seriously wrong."  Will obtain plain film x-rays and give Robaxin given palpable spasm.  He appears overall well and is ambulatory in the department and eating and drinking [employer] on my exam.  No neck stiffness or neck rigidity to suggest meningitis.  Plain films without acute findings.   Reevaluated pain resolved with Robaxin in the ED.  High suspicion for musculoskeletal sprain and spasm.  He does not appear  septic or ill.  I have low suspicion for acute infectious process given his HIV load undetectable.  Discussed with patient strict return precautions.  Patient voiced understanding agreeable to follow-up.   Patient with back pain.  No neurological deficits and normal neuro exam. No loss of bowel or bladder control.  No concern for cauda equina.  No fever, night sweats, weight loss, h/o cancer, IVDU.  RICE protocol and pain medicine indicated and discussed with patient.   The patient has been appropriately medically screened  and/or stabilized in the ED. I have low suspicion for any other emergent medical condition which would require further screening, evaluation or treatment in the ED or require inpatient management.  Patient is hemodynamically stable and in no acute distress.  Patient able to ambulate in department prior to ED.  Evaluation does not show acute pathology that would require ongoing or additional emergent interventions while in the emergency department or further inpatient treatment.  I have discussed the diagnosis with the patient and answered all questions.  Pain is been managed while in the emergency department and patient has no further complaints prior to discharge.  Patient is comfortable with plan discussed in room and is stable for discharge at this time.  I have discussed strict return precautions for returning to the emergency department.  Patient was encouraged to follow-up with PCP/specialist refer to at discharge.        Final Clinical Impressions(s) / ED Diagnoses   Final diagnoses:  Acute midline low back pain without sciatica  Muscle spasm    ED Discharge Orders         Ordered    lidocaine (LIDODERM) 5 %  Every 24 hours     06/30/19 0954    methocarbamol (ROBAXIN) 500 MG tablet  2 times daily     06/30/19 0954           Jameir Ake A, PA-C 06/30/19 0957    Blanchie Dessert, MD 06/30/19 1439

## 2019-06-30 NOTE — ED Triage Notes (Signed)
Pt presents with back pain for 11/2 weeks . Pt denies any injury. His mother has given him a muscle relaxer also.Pt has been taking ibuprofen  with out relief of back pain. Pt takes BP meds at HS.

## 2019-06-30 NOTE — Discharge Instructions (Signed)
Take the medications as prescribed.  If you develop high fever, bowel or bladder incontinence, numbness to your perineal region, ability to walk please seek reevaluation the emergency department.  Otherwise if your pain is unresolved over the next 3 days please follow-up with orthopedics.  Their contact information is listed on your discharge paperwork.

## 2019-07-22 ENCOUNTER — Ambulatory Visit: Payer: Self-pay

## 2019-07-31 ENCOUNTER — Other Ambulatory Visit: Payer: Self-pay

## 2019-07-31 ENCOUNTER — Ambulatory Visit: Payer: Self-pay

## 2019-08-21 NOTE — Addendum Note (Signed)
Addended by: Dolan Amen D on: 08/21/2019 04:28 PM   Modules accepted: Orders

## 2019-08-21 NOTE — Addendum Note (Signed)
Addended by: Dolan Amen D on: 08/21/2019 04:26 PM   Modules accepted: Orders

## 2019-08-28 ENCOUNTER — Other Ambulatory Visit: Payer: Self-pay

## 2019-08-28 DIAGNOSIS — R0789 Other chest pain: Secondary | ICD-10-CM

## 2019-08-29 LAB — T-HELPER CELL (CD4) - (RCID CLINIC ONLY)
CD4 % Helper T Cell: 33 % (ref 33–65)
CD4 T Cell Abs: 905 /uL (ref 400–1790)

## 2019-09-04 LAB — CBC WITH DIFFERENTIAL/PLATELET
Absolute Monocytes: 502 cells/uL (ref 200–950)
Basophils Absolute: 49 cells/uL (ref 0–200)
Basophils Relative: 0.9 %
Eosinophils Absolute: 92 cells/uL (ref 15–500)
Eosinophils Relative: 1.7 %
HCT: 43.4 % (ref 38.5–50.0)
Hemoglobin: 14.4 g/dL (ref 13.2–17.1)
Lymphs Abs: 2840 cells/uL (ref 850–3900)
MCH: 31.4 pg (ref 27.0–33.0)
MCHC: 33.2 g/dL (ref 32.0–36.0)
MCV: 94.6 fL (ref 80.0–100.0)
MPV: 9.8 fL (ref 7.5–12.5)
Monocytes Relative: 9.3 %
Neutro Abs: 1917 cells/uL (ref 1500–7800)
Neutrophils Relative %: 35.5 %
Platelets: 305 10*3/uL (ref 140–400)
RBC: 4.59 10*6/uL (ref 4.20–5.80)
RDW: 11.1 % (ref 11.0–15.0)
Total Lymphocyte: 52.6 %
WBC: 5.4 10*3/uL (ref 3.8–10.8)

## 2019-09-04 LAB — COMPLETE METABOLIC PANEL WITH GFR
AG Ratio: 1.4 (calc) (ref 1.0–2.5)
ALT: 20 U/L (ref 9–46)
AST: 17 U/L (ref 10–40)
Albumin: 4.4 g/dL (ref 3.6–5.1)
Alkaline phosphatase (APISO): 63 U/L (ref 36–130)
BUN: 15 mg/dL (ref 7–25)
CO2: 28 mmol/L (ref 20–32)
Calcium: 10.1 mg/dL (ref 8.6–10.3)
Chloride: 104 mmol/L (ref 98–110)
Creat: 1.14 mg/dL (ref 0.60–1.35)
GFR, Est African American: 91 mL/min/{1.73_m2} (ref >=60)
GFR, Est Non African American: 79 mL/min/{1.73_m2} (ref >=60)
Globulin: 3.1 g/dL (calc) (ref 1.9–3.7)
Glucose, Bld: 106 mg/dL — ABNORMAL HIGH (ref 65–99)
Potassium: 3.9 mmol/L (ref 3.5–5.3)
Sodium: 140 mmol/L (ref 135–146)
Total Bilirubin: 0.6 mg/dL (ref 0.2–1.2)
Total Protein: 7.5 g/dL (ref 6.1–8.1)

## 2019-09-04 LAB — HIV-1 RNA QUANT-NO REFLEX-BLD
HIV 1 RNA Quant: 21 copies/mL — ABNORMAL HIGH
HIV-1 RNA Quant, Log: 1.32 Log copies/mL — ABNORMAL HIGH

## 2019-09-04 LAB — RPR: RPR Ser Ql: NONREACTIVE

## 2019-09-09 ENCOUNTER — Other Ambulatory Visit: Payer: Self-pay

## 2019-09-09 ENCOUNTER — Ambulatory Visit: Payer: Self-pay

## 2019-09-09 ENCOUNTER — Encounter: Payer: Self-pay | Admitting: Infectious Disease

## 2019-09-09 ENCOUNTER — Ambulatory Visit (INDEPENDENT_AMBULATORY_CARE_PROVIDER_SITE_OTHER): Payer: Self-pay | Admitting: Infectious Disease

## 2019-09-09 VITALS — BP 140/93 | HR 78 | Temp 97.6°F | Ht 73.0 in | Wt 209.0 lb

## 2019-09-09 DIAGNOSIS — Z20822 Contact with and (suspected) exposure to covid-19: Secondary | ICD-10-CM

## 2019-09-09 DIAGNOSIS — I10 Essential (primary) hypertension: Secondary | ICD-10-CM

## 2019-09-09 DIAGNOSIS — Z23 Encounter for immunization: Secondary | ICD-10-CM

## 2019-09-09 DIAGNOSIS — B20 Human immunodeficiency virus [HIV] disease: Secondary | ICD-10-CM

## 2019-09-09 DIAGNOSIS — A549 Gonococcal infection, unspecified: Secondary | ICD-10-CM

## 2019-09-09 HISTORY — DX: Contact with and (suspected) exposure to covid-19: Z20.822

## 2019-09-09 MED ORDER — HYDROCHLOROTHIAZIDE 25 MG PO TABS: 25.0000 mg | ORAL_TABLET | Freq: Every day | ORAL | 11 refills | Status: DC

## 2019-09-09 MED ORDER — DOVATO 50-300 MG PO TABS: 1.0000 | ORAL_TABLET | Freq: Every day | ORAL | 11 refills | Status: DC

## 2019-09-09 MED ORDER — AMLODIPINE BESYLATE 10 MG PO TABS: ORAL_TABLET | ORAL | 11 refills | Status: DC

## 2019-09-09 NOTE — Progress Notes (Signed)
Subjective:   Chief complaint: Follow-up for HIV with recent Covid exposure   Patient ID: Cody Barton, male    DOB: [birth year], 44 y.o.   MRN: [id number]  HPI  44 year-old African-American with HIV infection acquired infection in whom we had started Complera and who has had perfect virological suppression. He admitted that he sometimes was taking the complera on an empty stomach which I said was absolutely a bad idea and we  switched him to [name].   Recently simplified him to [name].  When last saw Cody Barton he was suffering from some atypical chest pain which is now resolved.  More recently January 9 he spent time with a friend of his drinking for several hours together.  His friend subsequently tested positive for COVID-19.  He himself was tested on the 12th and tested negative and is currently asymptomatic but does not appear to be quarantining at present.  I have asked him to quarantine through 14 days post exposure.  .    Past Medical History:  Diagnosis Date  . Atypical chest pain 04/08/2019  . GERD (gastroesophageal reflux disease) 01/07/2019  . Gonorrhea   . HIV (human immunodeficiency virus infection) (HCC)   . Hypertension   . Major depressive disorder, single episode 04/19/2016  . Otitis media 12/16/2014  . Seasonal allergies 12/16/2014  . Sore throat 12/16/2014    No past surgical history on file.  Family History  Problem Relation Age of Onset  . Hypertension Father   . Cancer Maternal Grandfather       Social History   Socioeconomic History  . Marital status: Single    Spouse name: Not on file  . Number of children: Not on file  . Years of education: Not on file  . Highest education level: Not on file  Occupational History  . Not on file  Tobacco Use  . Smoking status: Former Smoker   Packs/day: 0.30    Years: 1.50    Pack years: 0.45    Types: Cigarettes    Quit date: 08/21/2017    Years since quitting: 2.0  . Smokeless tobacco: Never Used  . Tobacco comment:  4-6 day  Substance and Sexual Activity  . Alcohol use: Yes    Alcohol/week: 0.0 standard drinks    Comment:    weekend  . Drug use: Yes    Frequency: 3.0 times per week    Types: Marijuana    Comment: daily use   . Sexual activity: Yes    Partners: Male    Birth control/protection: None  Other Topics Concern  . Not on file  Social History Narrative   ** Merged History Encounter **       ** Merged History Encounter **       Social Determinants of Health   Financial Resource Strain:   . Difficulty of Paying Living Expenses: Not on file  Food Insecurity:   . Worried About Programme researcher, broadcasting/film/video in the Last Year: Not on file  . Ran Out of Food in the Last Year: Not on file  Transportation Needs:   . Lack of Transportation (Medical): Not on file  . Lack of Transportation (Non-Medical): Not on file  Physical Activity:   . Days of Exercise per Week: Not on file  . Minutes of Exercise per Session: Not on file  Stress:   . Feeling of Stress : Not on file  Social Connections:   . Frequency of Communication with Friends and Family: Not on file  . Frequency of Social Gatherings with Friends and Family: Not on file  . Attends Religious Services: Not on file  . Active Member of Clubs or Organizations: Not on file  . Attends Archivist Meetings: Not on file  . Marital Status: Not on file    No Known Allergies   Current Outpatient Medications:  .  amLODipine (NORVASC) 10 MG tablet, TAKE 1 TABLET(10 MG) BY MOUTH DAILY, Disp: 30 tablet, Rfl: 11 .  Dolutegravir-lamiVUDine (DOVATO) 50-300 MG TABS, Take 1 tablet by mouth daily., Disp: 30 tablet, Rfl: 11 .  hydrochlorothiazide (HYDRODIURIL) 25 MG tablet, Take 1 tablet (25 mg total) by mouth daily., Disp: 30 tablet, Rfl: 11 .  lidocaine (LIDODERM)  5 %, Place 1 patch onto the skin daily. Remove & Discard patch within 12 hours or as directed by MD, Disp: 30 patch, Rfl: 0 .  methocarbamol (ROBAXIN) 500 MG tablet, Take 1 tablet (500 mg total) by mouth 2 (two) times daily., Disp: 20 tablet, Rfl: 0 .  multivitamin-iron-minerals-folic acid (CENTRUM) chewable tablet, Chew 1 tablet by mouth daily., Disp: , Rfl:  .  Omega-3 Fatty Acids (FISH OIL) 1200 MG CPDR, Take by mouth., Disp: , Rfl:  .  omeprazole (PRILOSEC) 20 MG capsule, Take 1 capsule (20 mg total) by mouth daily. (Patient not taking: Reported on 01/07/2019), Disp: 30 capsule, Rfl: 0  Lab Results  Component Value Date   HIV1RNAQUANT 21 (H) 08/28/2019   HIV1RNAQUANT <20 NOT DETECTED 04/08/2019   HIV1RNAQUANT <20 DETECTED (A) 12/12/2018      Lab Results  Component Value Date   CD4TABS 905 08/28/2019   CD4TABS 1,016 04/08/2019   CD4TABS 960 12/12/2018   Past Medical History:  Diagnosis Date  . Atypical chest pain 04/08/2019  . GERD (gastroesophageal reflux disease) 01/07/2019  . Gonorrhea   . HIV (human immunodeficiency virus infection) ([place])   . Hypertension   . Major depressive disorder, single episode 04/19/2016  . Otitis media 12/16/2014  . Seasonal allergies 12/16/2014  . Sore throat 12/16/2014    No past surgical history on file.  Family History  Problem Relation Age of Onset  . Hypertension Father   . Cancer Maternal Grandfather       Social History   Socioeconomic History  . Marital status: Single    Spouse name: Not on file  . Number of children: Not on file  . Years of education: Not on file  . Highest education level: Not on file  Occupational History  . Not on file  Tobacco Use  . Smoking status: Former Smoker    Packs/day: 0.30    Years: 1.50    Pack years: 0.45    Types: Cigarettes    Quit date: 08/21/2017    Years since quitting: 2.0  . Smokeless tobacco: Never Used  . Tobacco comment:  4-6 day  Substance and Sexual Activity  . Alcohol use:  Yes    Alcohol/week: 0.0 standard drinks    Comment:    weekend  . Drug use: Yes    Frequency: 3.0 times per week    Types: Marijuana    Comment: daily use   . Sexual activity: Yes    Partners: Male    Birth control/protection: None  Other Topics Concern  . Not on file  Social History Narrative   **  Merged History Encounter **       ** Merged History Encounter **       Social Determinants of Health   Financial Resource Strain:   . Difficulty of Paying Living Expenses: Not on file  Food Insecurity:   . Worried About Programme researcher, broadcasting/film/video in the Last Year: Not on file  . Ran Out of Food in the Last Year: Not on file  Transportation Needs:   . Lack of Transportation (Medical): Not on file  . Lack of Transportation (Non-Medical): Not on file  Physical Activity:   . Days of Exercise per Week: Not on file  . Minutes of Exercise per Session: Not on file  Stress:   . Feeling of Stress : Not on file  Social Connections:   . Frequency of Communication with Friends and Family: Not on file  . Frequency of Social Gatherings with Friends and Family: Not on file  . Attends Religious Services: Not on file  . Active Member of Clubs or Organizations: Not on file  . Attends Banker Meetings: Not on file  . Marital Status: Not on file    No Known Allergies   Current Outpatient Medications:  .  amLODipine (NORVASC) 10 MG tablet, TAKE 1 TABLET(10 MG) BY MOUTH DAILY, Disp: 30 tablet, Rfl: 11 .  Dolutegravir-lamiVUDine (DOVATO) 50-300 MG TABS, Take 1 tablet by mouth daily., Disp: 30 tablet, Rfl: 11 .  hydrochlorothiazide (HYDRODIURIL) 25 MG tablet, Take 1 tablet (25 mg total) by mouth daily., Disp: 30 tablet, Rfl: 11 .  lidocaine (LIDODERM) 5 %, Place 1 patch onto the skin daily. Remove & Discard patch within 12 hours or as directed by MD, Disp: 30 patch, Rfl: 0 .  methocarbamol (ROBAXIN) 500 MG tablet, Take 1 tablet (500 mg total) by mouth 2 (two) times daily., Disp: 20 tablet,  Rfl: 0 .  multivitamin-iron-minerals-folic acid (CENTRUM) chewable tablet, Chew 1 tablet by mouth daily., Disp: , Rfl:  .  Omega-3 Fatty Acids (FISH OIL) 1200 MG CPDR, Take by mouth., Disp: , Rfl:  .  omeprazole (PRILOSEC) 20 MG capsule, Take 1 capsule (20 mg total) by mouth daily. (Patient not taking: Reported on 01/07/2019), Disp: 30 capsule, Rfl: 0   Review of Systems  Constitutional: Negative for activity change, appetite change, chills, diaphoresis, fatigue, fever and unexpected weight change.  HENT: Negative for congestion, rhinorrhea, sinus pressure, sneezing, sore throat and trouble swallowing.   Eyes: Negative for photophobia and visual disturbance.  Respiratory: Negative for cough, chest tightness, shortness of breath, wheezing and stridor.   Cardiovascular: Negative for chest pain, palpitations and leg swelling.  Gastrointestinal: Negative for abdominal distention, abdominal pain, anal bleeding, constipation, diarrhea, nausea and vomiting.  Genitourinary: Negative for difficulty urinating, dysuria, flank pain and hematuria.  Musculoskeletal: Negative for gait problem, joint swelling and myalgias.  Skin: Negative for color change, pallor, rash and wound.  Neurological: Negative for dizziness, tremors, weakness and light-headedness.  Hematological: Negative for adenopathy. Does not bruise/bleed easily.  Psychiatric/Behavioral: Negative for agitation, behavioral problems, confusion, decreased concentration, dysphoric mood and sleep disturbance.       Objective:   Physical Exam  Constitutional: He is oriented to person, place, and time. He appears well-nourished. No distress.  HENT:  Head: Normocephalic and atraumatic.  Right Ear: No drainage, swelling or tenderness. No decreased hearing is noted.  Left Ear: No drainage, swelling or tenderness. No decreased hearing is noted.  Mouth/Throat: Oropharynx is clear and moist. No oropharyngeal exudate.  Eyes:  Conjunctivae and EOM are  normal. No scleral icterus.  Cardiovascular: Normal rate and regular rhythm.  Pulmonary/Chest: Effort normal. No respiratory distress. He has no wheezes.  Abdominal: He exhibits no distension.  Musculoskeletal:        General: No tenderness or edema.     Right shoulder: No tenderness or bony tenderness. Normal range of motion.     Cervical back: Normal range of motion and neck supple.  Neurological: He is alert and oriented to person, place, and time. He exhibits normal muscle tone. Coordination normal.  Skin: Skin is warm and dry. He is not diaphoretic.  Psychiatric: He has a normal mood and affect. His behavior is normal. Judgment and thought content normal.  Nursing note and vitals reviewed.         Assessment & Plan:    HIV: Dovato be continued his HMA P is renewed and he can come back in July  HTN: norvasc 10mg  and hydrochlorothiazide to be continued  COVID-19 and exposure needs to self quarantine through 2 weeks post exposure unless he develops symptoms or course he would then need to self isolate for longer.  We will vaccinate him for flu today

## 2019-09-09 NOTE — Patient Instructions (Signed)
You should quarantine until January 22nd due to COVID exposure on the 9th  (provided you do not develop symptoms)

## 2019-09-11 ENCOUNTER — Encounter: Payer: Self-pay | Admitting: Infectious Disease

## 2019-11-29 ENCOUNTER — Other Ambulatory Visit: Payer: Self-pay

## 2019-11-29 ENCOUNTER — Ambulatory Visit (HOSPITAL_COMMUNITY)
Admission: EM | Admit: 2019-11-29 | Discharge: 2019-11-29 | Disposition: A | Discharge status: Home / Self Care | Payer: Self-pay | Attending: Urgent Care | Admitting: Urgent Care

## 2019-11-29 ENCOUNTER — Other Ambulatory Visit: Payer: Self-pay | Admitting: Infectious Disease

## 2019-11-29 ENCOUNTER — Encounter (HOSPITAL_COMMUNITY): Payer: Self-pay

## 2019-11-29 DIAGNOSIS — B20 Human immunodeficiency virus [HIV] disease: Secondary | ICD-10-CM | POA: Insufficient documentation

## 2019-11-29 DIAGNOSIS — N453 Epididymo-orchitis: Secondary | ICD-10-CM | POA: Insufficient documentation

## 2019-11-29 DIAGNOSIS — A549 Gonococcal infection, unspecified: Secondary | ICD-10-CM

## 2019-11-29 DIAGNOSIS — N50812 Left testicular pain: Secondary | ICD-10-CM | POA: Insufficient documentation

## 2019-11-29 LAB — POCT URINALYSIS DIP (DEVICE)
Bilirubin Urine: NEGATIVE
Glucose, UA: NEGATIVE mg/dL
Ketones, ur: NEGATIVE mg/dL
Nitrite: NEGATIVE
Protein, ur: 30 mg/dL — AB
Specific Gravity, Urine: >=1.03 (ref 1.005–1.030)
Urobilinogen, UA: 0.2 mg/dL (ref 0.0–1.0)
pH: 5.5 (ref 5.0–8.0)

## 2019-11-29 MED ORDER — NAPROXEN 500 MG PO TABS: 500.0000 mg | ORAL_TABLET | Freq: Two times a day (BID) | ORAL | 0 refills | Status: DC

## 2019-11-29 MED ORDER — IBUPROFEN 800 MG PO TABS
800.0000 mg | ORAL_TABLET | Freq: Once | ORAL | Status: AC
  Administered 2019-11-29: 800 mg via ORAL

## 2019-11-29 MED ORDER — IBUPROFEN 800 MG PO TABS
ORAL_TABLET | ORAL | Status: AC
  Filled 2019-11-29: qty 1

## 2019-11-29 MED ORDER — LEVOFLOXACIN 500 MG PO TABS: 500.0000 mg | ORAL_TABLET | Freq: Every day | ORAL | 0 refills | Status: DC

## 2019-11-29 NOTE — ED Provider Notes (Signed)
Cody Barton   MRN: [id number] DOB: 06-May-1976  Subjective:   Cody Barton is a 44 y.o. male presenting for 4-day history of acute onset left-sided testicle pain and swelling.  Patient states that symptoms happened after he had sex.  States that he noticed the discharge in his come was brown.  However, has not had any further discharge, dysuria, fever, nausea, vomiting, belly pain, genital rash.  Patient has a history of HIV disease and is taking medication for this.  He states that he has had sex with this individual before and generally trust them.  However, he is agreeable to STI testing.  His primary goal is to help with pain today.  No current facility-administered medications for this encounter.  Current Outpatient Medications:  .  amLODipine (NORVASC) 10 MG tablet, TAKE 1 TABLET(10 MG) BY MOUTH DAILY, Disp: 30 tablet, Rfl: 11 .  Dolutegravir-lamiVUDine (DOVATO) 50-300 MG TABS, Take 1 tablet by mouth daily., Disp: 30 tablet, Rfl: 11 .  hydrochlorothiazide (HYDRODIURIL) 25 MG tablet, Take 1 tablet (25 mg total) by mouth daily., Disp: 30 tablet, Rfl: 11 .  lidocaine (LIDODERM) 5 %, Place 1 patch onto the skin daily. Remove & Discard patch within 12 hours or as directed by MD, Disp: 30 patch, Rfl: 0 .  methocarbamol (ROBAXIN) 500 MG tablet, Take 1 tablet (500 mg total) by mouth 2 (two) times daily., Disp: 20 tablet, Rfl: 0 .  multivitamin-iron-minerals-folic acid (CENTRUM) chewable tablet, Chew 1 tablet by mouth daily., Disp: , Rfl:  .  Omega-3 Fatty Acids (FISH OIL) 1200 MG CPDR, Take by mouth., Disp: , Rfl:  .  omeprazole (PRILOSEC) 20 MG capsule, Take 1 capsule (20 mg total) by mouth daily. (Patient not taking: Reported on 09/09/2019), Disp: 30 capsule, Rfl: 0   No Known Allergies  Past Medical History:  Diagnosis Date  . Atypical chest pain 04/08/2019  . Exposure to COVID-19 virus 09/09/2019  . GERD (gastroesophageal reflux disease) 01/07/2019  . Gonorrhea   . HIV  (human immunodeficiency virus infection) ([place])   . Hypertension   . Major depressive disorder, single episode 04/19/2016  . Otitis media 12/16/2014  . Seasonal allergies 12/16/2014  . Sore throat 12/16/2014     History reviewed. No pertinent surgical history.  Family History  Problem Relation Age of Onset  . Hypertension Father   . Cancer Maternal Grandfather     Social History   Tobacco Use  . Smoking status: Former Smoker    Packs/day: 0.30    Years: 1.50    Pack years: 0.45    Types: Cigarettes    Quit date: 08/21/2017    Years since quitting: 2.2  . Smokeless tobacco: Never Used  . Tobacco comment:  4-6 day  Substance Use Topics  . Alcohol use: Yes    Alcohol/week: 0.0 standard drinks    Comment:    weekend  . Drug use: Yes    Frequency: 3.0 times per week    Types: Marijuana    Comment: daily use     ROS   Objective:   Vitals: BP 129/86 (BP Location: Right Arm)   Pulse 72   Temp 99.3 F (37.4 C) (Oral)   Resp 18   SpO2 95%   Physical Exam Constitutional:      General: He is not in acute distress.    Appearance: Normal appearance. He is well-developed and normal weight. He is not ill-appearing, toxic-appearing or diaphoretic.  HENT:     Head: Normocephalic and atraumatic.  Right Ear: External ear normal.     Left Ear: External ear normal.     Nose: Nose normal.     Mouth/Throat:     Pharynx: Oropharynx is clear.  Eyes:     General: No scleral icterus.       Right eye: No discharge.        Left eye: No discharge.     Extraocular Movements: Extraocular movements intact.     Pupils: Pupils are equal, round, and reactive to light.  Cardiovascular:     Rate and Rhythm: Normal rate.  Pulmonary:     Effort: Pulmonary effort is normal.  Genitourinary:    Penis: Circumcised. No phimosis, paraphimosis, hypospadias, erythema, tenderness, discharge, swelling or lesions.      Testes:        Right: Mass, tenderness, swelling, testicular hydrocele or  varicocele not present. Right testis is descended. Cremasteric reflex is present.         Left: Tenderness (worse posteriorly) and swelling present. Mass, testicular hydrocele or varicocele not present. Left testis is descended. Cremasteric reflex is present.   Musculoskeletal:     Cervical back: Normal range of motion.  Neurological:     Mental Status: He is alert and oriented to person, place, and time.  Psychiatric:        Mood and Affect: Mood normal.        Behavior: Behavior normal.        Thought Content: Thought content normal.        Judgment: Judgment normal.     Results for orders placed or performed during the hospital encounter of 11/29/19 (from the past 24 hour(s))  POCT urinalysis dip (device)     Status: Abnormal   Collection Time: 11/29/19  5:49 PM  Result Value Ref Range   Glucose, UA NEGATIVE NEGATIVE mg/dL   Bilirubin Urine NEGATIVE NEGATIVE   Ketones, ur NEGATIVE NEGATIVE mg/dL   Specific Gravity, Urine >=1.030 1.005 - 1.030   Hgb urine dipstick MODERATE (A) NEGATIVE   pH 5.5 5.0 - 8.0   Protein, ur 30 (A) NEGATIVE mg/dL   Urobilinogen, UA 0.2 0.0 - 1.0 mg/dL   Nitrite NEGATIVE NEGATIVE   Leukocytes,Ua SMALL (A) NEGATIVE    Assessment and Plan :   1. Epididymoorchitis   2. Testicular pain, left   3. HIV disease (HCC)     Had extensive discussion with patient about etiologies for symptoms.  Ultimately we agreed to treat with levofloxacin 500 mg for 10 days.  Patient states he is okay with coming back for treatment of STIs if necessary. Counseled patient on potential for adverse effects with medications prescribed/recommended today, ER and return-to-clinic precautions discussed, patient verbalized understanding.    Cody Barton, [place] 11/29/19 Cody Barton

## 2019-11-29 NOTE — ED Triage Notes (Signed)
Pt reports having testicular pain and swelling x 4-5 days. Pt states is difficult and painful when he walks. Pt reports 5 days ago after he had sex he had brown discharge.

## 2019-11-29 NOTE — Discharge Instructions (Addendum)
We will treat your symptoms for epididymitis now. However, you sexually transmitted infection testing is still not resulted. Hydrate well with plain water, at least 2 liters daily. Use naproxen for pain and inflammation.

## 2019-11-30 ENCOUNTER — Emergency Department (HOSPITAL_COMMUNITY)
Admission: EM | Admit: 2019-11-30 | Discharge: 2019-11-30 | Disposition: A | Discharge status: Home / Self Care | Payer: Self-pay | Attending: Emergency Medicine | Admitting: Emergency Medicine

## 2019-11-30 ENCOUNTER — Other Ambulatory Visit: Payer: Self-pay

## 2019-11-30 ENCOUNTER — Encounter (HOSPITAL_COMMUNITY): Payer: Self-pay | Admitting: Emergency Medicine

## 2019-11-30 DIAGNOSIS — Z8616 Personal history of COVID-19: Secondary | ICD-10-CM | POA: Insufficient documentation

## 2019-11-30 DIAGNOSIS — Z87891 Personal history of nicotine dependence: Secondary | ICD-10-CM | POA: Insufficient documentation

## 2019-11-30 DIAGNOSIS — B20 Human immunodeficiency virus [HIV] disease: Secondary | ICD-10-CM | POA: Insufficient documentation

## 2019-11-30 DIAGNOSIS — Z79899 Other long term (current) drug therapy: Secondary | ICD-10-CM | POA: Insufficient documentation

## 2019-11-30 DIAGNOSIS — I10 Essential (primary) hypertension: Secondary | ICD-10-CM | POA: Insufficient documentation

## 2019-11-30 DIAGNOSIS — R42 Dizziness and giddiness: Secondary | ICD-10-CM | POA: Insufficient documentation

## 2019-11-30 LAB — CBC WITH DIFFERENTIAL/PLATELET
Abs Immature Granulocytes: 0.04 10*3/uL (ref 0.00–0.07)
Basophils Absolute: 0.1 10*3/uL (ref 0.0–0.1)
Basophils Relative: 1 %
Eosinophils Absolute: 0.1 10*3/uL (ref 0.0–0.5)
Eosinophils Relative: 0 %
HCT: 42.4 % (ref 39.0–52.0)
Hemoglobin: 13.8 g/dL (ref 13.0–17.0)
Immature Granulocytes: 0 %
Lymphocytes Relative: 27 %
Lymphs Abs: 3.3 10*3/uL (ref 0.7–4.0)
MCH: 31.4 pg (ref 26.0–34.0)
MCHC: 32.5 g/dL (ref 30.0–36.0)
MCV: 96.6 fL (ref 80.0–100.0)
Monocytes Absolute: 1.1 10*3/uL — ABNORMAL HIGH (ref 0.1–1.0)
Monocytes Relative: 9 %
Neutro Abs: 7.9 10*3/uL — ABNORMAL HIGH (ref 1.7–7.7)
Neutrophils Relative %: 63 %
Platelets: 309 10*3/uL (ref 150–400)
RBC: 4.39 MIL/uL (ref 4.22–5.81)
RDW: 11.9 % (ref 11.5–15.5)
WBC: 12.5 10*3/uL — ABNORMAL HIGH (ref 4.0–10.5)
nRBC: 0 % (ref 0.0–0.2)

## 2019-11-30 LAB — COMPREHENSIVE METABOLIC PANEL
ALT: 18 U/L (ref 0–44)
AST: 20 U/L (ref 15–41)
Albumin: 4 g/dL (ref 3.5–5.0)
Alkaline Phosphatase: 63 U/L (ref 38–126)
Anion gap: 14 (ref 5–15)
BUN: 18 mg/dL (ref 6–20)
CO2: 24 mmol/L (ref 22–32)
Calcium: 9.1 mg/dL (ref 8.9–10.3)
Chloride: 101 mmol/L (ref 98–111)
Creatinine, Ser: 1.02 mg/dL (ref 0.61–1.24)
GFR calc Af Amer: >60 mL/min (ref >60)
GFR calc non Af Amer: >60 mL/min (ref >60)
Glucose, Bld: 134 mg/dL — ABNORMAL HIGH (ref 70–99)
Potassium: 3.6 mmol/L (ref 3.5–5.1)
Sodium: 139 mmol/L (ref 135–145)
Total Bilirubin: 0.7 mg/dL (ref 0.3–1.2)
Total Protein: 7.8 g/dL (ref 6.5–8.1)

## 2019-11-30 NOTE — ED Triage Notes (Signed)
Patient felt lightheaded " feels hot" with near syncopal episode this morning , denies pain , respirations unlabored , alert and oriented .

## 2019-11-30 NOTE — ED Provider Notes (Signed)
[facility] EMERGENCY DEPARTMENT Provider Note   CSN: [id number] Arrival date & time: 11/30/19  4401     History Chief Complaint  Patient presents with  . Near Syncope/Lightheaded    Cody Barton is a 44 y.o. male.  The history is provided by the patient and medical records. No language interpreter was used.     44 year old male with history of HIV, hypertension, GERD presenting for evaluation of lightheadedness.  Patient report he brought his daughter to the ER to be evaluated.  While sitting in the waiting room, he report having a transient episodes of lightheadedness where he breaks out into sweat, feels as if he was going to pass out and was needing to use the bathroom.  He called for help, and decided to check in.  After receiving a towel and while he sat and rest, his symptom has abated.  States symptom lasting for few minutes only.  He did not have any significant pain at that time no headache no chest pain no shortness of breath no focal numbness or focal weakness.  He was seen yesterday for testicle discomfort and was prescribed a new antibiotic.  He is unsure of his symptoms may be related to the new antibiotic.  Past Medical History:  Diagnosis Date  . Atypical chest pain 04/08/2019  . Exposure to COVID-19 virus 09/09/2019  . GERD (gastroesophageal reflux disease) 01/07/2019  . Gonorrhea   . HIV (human immunodeficiency virus infection) (HCC)   . Hypertension   . Major depressive disorder, single episode 04/19/2016  . Otitis media 12/16/2014  . Seasonal allergies 12/16/2014  . Sore throat 12/16/2014    Patient Active Problem List   Diagnosis Date Noted  . Exposure to COVID-19 virus 09/09/2019  . Atypical chest pain 04/08/2019  . GERD (gastroesophageal reflux disease) 01/07/2019  . Healthcare maintenance 06/12/2018  . Elevated serum creatinine 06/12/2018  . Major depressive disorder, single episode 04/19/2016  . Otitis media 12/16/2014  . Seasonal  allergies 12/16/2014  . HIV disease (HCC) 10/10/2012  . HTN (hypertension) 10/10/2012    History reviewed. No pertinent surgical history.     Family History  Problem Relation Age of Onset  . Hypertension Father   . Cancer Maternal Grandfather     Social History   Tobacco Use  . Smoking status: Former Smoker    Packs/day: 0.30    Years: 1.50    Pack years: 0.45    Types: Cigarettes    Quit date: 08/21/2017    Years since quitting: 2.2  . Smokeless tobacco: Never Used  . Tobacco comment:  4-6 day  Substance Use Topics  . Alcohol use: Yes    Alcohol/week: 0.0 standard drinks    Comment:    weekend  . Drug use: Yes    Frequency: 3.0 times per week    Types: Marijuana    Comment: daily use     Home Medications Prior to Admission medications   Medication Sig Start Date End Date Taking? Authorizing Provider  amLODipine (NORVASC) 10 MG tablet TAKE 1 TABLET(10 MG) BY MOUTH DAILY 09/09/19   Daiva Eves, Lisette Grinder, MD  Dolutegravir-lamiVUDine (DOVATO) 50-300 MG TABS Take 1 tablet by mouth daily. 09/09/19   Randall Hiss, MD  hydrochlorothiazide (HYDRODIURIL) 25 MG tablet Take 1 tablet (25 mg total) by mouth daily. 09/09/19   Randall Hiss, MD  levofloxacin (LEVAQUIN) 500 MG tablet Take 1 tablet (500 mg total) by mouth daily. 11/29/19   Urban Gibson,  Mario, PA-C  lidocaine (LIDODERM) 5 % Place 1 patch onto the skin daily. Remove & Discard patch within 12 hours or as directed by MD 06/30/19   Henderly, Britni A, PA-C  methocarbamol (ROBAXIN) 500 MG tablet Take 1 tablet (500 mg total) by mouth 2 (two) times daily. 06/30/19   Henderly, Britni A, PA-C  multivitamin-iron-minerals-folic acid (CENTRUM) chewable tablet Chew 1 tablet by mouth daily.    [provider]  naproxen (NAPROSYN) 500 MG tablet Take 1 tablet (500 mg total) by mouth 2 (two) times daily. 11/29/19   Wallis Bamberg, PA-C  Omega-3 Fatty Acids (FISH OIL) 1200 MG CPDR Take by mouth.    [provider]   omeprazole (PRILOSEC) 20 MG capsule Take 1 capsule (20 mg total) by mouth daily. Patient not taking: Reported on 09/09/2019 12/22/17   Eustace Moore, MD    Allergies    Patient has no known allergies.  Review of Systems   Review of Systems  All other systems reviewed and are negative.   Physical Exam Updated Vital Signs BP 126/83 (BP Location: Left Arm)   Pulse 83   Temp 98 F (36.7 C) (Oral)   Resp 17   Ht 5\' 8"  (1.727 m)   Wt 90 kg   SpO2 95%   BMI 30.17 kg/m   Physical Exam Vitals and nursing note reviewed.  Constitutional:      General: He is not in acute distress.    Appearance: He is well-developed.     Comments: Patient is sitting in the chair, resting comfortably in no acute discomfort.  HENT:     Head: Atraumatic.  Eyes:     Conjunctiva/sclera: Conjunctivae normal.  Cardiovascular:     Rate and Rhythm: Normal rate and regular rhythm.     Pulses: Normal pulses.     Heart sounds: Normal heart sounds.  Pulmonary:     Effort: Pulmonary effort is normal.     Breath sounds: Normal breath sounds.  Abdominal:     Palpations: Abdomen is soft.  Musculoskeletal:        General: No tenderness.     Cervical back: Neck supple.  Skin:    Findings: No rash.  Neurological:     Mental Status: He is alert and oriented to person, place, and time.  Psychiatric:        Mood and Affect: Mood normal.     ED Results / Procedures / Treatments   Labs (all labs ordered are listed, but only abnormal results are displayed) Labs Reviewed  CBC WITH DIFFERENTIAL/PLATELET - Abnormal; Notable for the following components:      Result Value   WBC 12.5 (*)    Neutro Abs 7.9 (*)    Monocytes Absolute 1.1 (*)    All other components within normal limits  COMPREHENSIVE METABOLIC PANEL - Abnormal; Notable for the following components:   Glucose, Bld 134 (*)    All other components within normal limits    EKG None   Date: 11/30/2019  Rate: 82  Rhythm: normal sinus rhythm   QRS Axis: normal  Intervals: normal  ST/T Wave abnormalities: normal  Conduction Disutrbances: none  Narrative Interpretation:   Old EKG Reviewed: No significant changes noted EKG reviewed and interpreted by me.     Radiology No results found.  Procedures Procedures (including critical care time)  Medications Ordered in ED Medications - No data to display  ED Course  I have reviewed the triage vital signs and the nursing  notes.  Pertinent labs & imaging results that were available during my care of the patient were reviewed by me and considered in my medical decision making (see chart for details).    MDM Rules/Calculators/A&P                      BP 126/83 (BP Location: Left Arm)   Pulse 83   Temp 98 F (36.7 C) (Oral)   Resp 17   Ht 5\' 8"  (1.727 m)   Wt 90 kg   SpO2 95%   BMI 30.17 kg/m   Final Clinical Impression(s) / ED Diagnoses Final diagnoses:  Lightheadedness    Rx / DC Orders ED Discharge Orders    None     8:00 AM Patient report of transient episodes of lightheadedness while he was in the waiting room waiting for his daughter who was being evaluated in the ER.  He decided to check in for further evaluation but after waiting for short amount of time his symptoms abated and now he is back to his normal baseline.  EKG without any concerning arrhythmia.  Vital signs stable.  Mildly elevated white count of 12.5.  Electrolytes panels are reassuring.  We will obtain orthostatic vital sign.   9:25 AM Normal orthostatic vital sign.  Stable for discharge.  Return precaution discussed.    Domenic Moras, PA-C 11/30/19 [place], [place], DO 11/30/19 1539

## 2019-11-30 NOTE — Discharge Instructions (Signed)
You have been evaluated for your episode of lightheadedness.  Fortunately your blood pressure is stable, your labs are reassuring, your EKG is normal.  Follow up with your doctor for further care.

## 2019-11-30 NOTE — ED Notes (Signed)
Pt discharge instructions and follow-up care reviewed with the patient. The patient verbalized understanding of both. Pt discharged. 

## 2019-12-01 LAB — URINE CULTURE: Culture: NO GROWTH

## 2019-12-02 LAB — CBG MONITORING, ED: Glucose-Capillary: 138 mg/dL — ABNORMAL HIGH (ref 70–99)

## 2019-12-02 LAB — CYTOLOGY, (ORAL, ANAL, URETHRAL) ANCILLARY ONLY
Chlamydia: NEGATIVE
Comment: NEGATIVE
Comment: NEGATIVE
Comment: NORMAL
Neisseria Gonorrhea: NEGATIVE
Trichomonas: NEGATIVE

## 2019-12-08 ENCOUNTER — Other Ambulatory Visit: Payer: Self-pay | Admitting: Infectious Disease

## 2019-12-08 DIAGNOSIS — B20 Human immunodeficiency virus [HIV] disease: Secondary | ICD-10-CM

## 2020-01-14 ENCOUNTER — Encounter: Payer: Self-pay | Admitting: Infectious Disease

## 2020-01-14 ENCOUNTER — Ambulatory Visit (INDEPENDENT_AMBULATORY_CARE_PROVIDER_SITE_OTHER): Payer: Self-pay | Admitting: Infectious Disease

## 2020-01-14 ENCOUNTER — Other Ambulatory Visit: Payer: Self-pay

## 2020-01-14 VITALS — BP 132/91 | HR 74 | Temp 97.9°F | Wt 207.0 lb

## 2020-01-14 DIAGNOSIS — N453 Epididymo-orchitis: Secondary | ICD-10-CM | POA: Insufficient documentation

## 2020-01-14 DIAGNOSIS — B20 Human immunodeficiency virus [HIV] disease: Secondary | ICD-10-CM

## 2020-01-14 DIAGNOSIS — I10 Essential (primary) hypertension: Secondary | ICD-10-CM

## 2020-01-14 HISTORY — DX: Epididymo-orchitis: N45.3

## 2020-01-14 NOTE — Progress Notes (Signed)
Subjective:   Chief complaint: Follow-up for HIV with recent Covid exposure   Patient ID: Cody Barton, male    DOB: June 29, 1976, 44 y.o.   MRN: [id number]  HPI  44 year-old African-American with HIV infection acquired infection in whom we had started Complera and who has had perfect virological suppression. He admitted that he sometimes was taking the complera on an empty stomach which I said was absolutely a bad idea and we  switched him to [facility] and he has maintained perfect virological suppression since then.   Recently simplified him to [facility] and he has made perfect virological suppression  He was seen in the ER recently with swollen testicle and diagnosed with epididymal orchitis.  Urine was negative for STIs but he responded to levofloxacin with resolution of his symptoms.  He is otherwise doing well does need to renew HMA P in July.  I counseled him strongly to get COVID-19 vaccination I think he will now do so.   Marland Kitchen    Past Medical History:  Diagnosis Date  . Atypical chest pain 04/08/2019  . Exposure to COVID-19 virus 09/09/2019  . GERD (gastroesophageal reflux disease) 01/07/2019  . Gonorrhea   . HIV (human immunodeficiency virus infection) (HCC)   . Hypertension   . Major depressive disorder, single episode 04/19/2016  . Otitis media 12/16/2014  . Seasonal allergies 12/16/2014  . Sore throat 12/16/2014    No past surgical history on file.  Family History  Problem Relation Age of Onset  . Hypertension Father   . Cancer Maternal Grandfather       Social History   Socioeconomic History  . Marital status: Single    Spouse name: Not on file  . Number of children: Not on file  . Years of education: Not on file  . Highest education level: Not on file  Occupational History  . Not on file  Tobacco Use  . Smoking status: Former Smoker    Packs/day: 0.30    Years: 1.50    Pack years: 0.45    Types: Cigarettes    Quit date: 08/21/2017    Years since  quitting: 2.4  . Smokeless tobacco: Never Used  . Tobacco comment:  4-6 day  Substance and Sexual Activity  . Alcohol use: Yes    Alcohol/week: 0.0 standard drinks    Comment:    weekend  . Drug use: Yes    Frequency: 3.0 times per week    Types: Marijuana    Comment: daily use   . Sexual activity: Yes    Partners: Male    Birth control/protection: None  Other Topics Concern  . Not on file  Social History Narrative   ** Merged History Encounter **       ** Merged History Encounter **       Social Determinants of Health   Financial Resource Strain:   . Difficulty of Paying Living Expenses:   Food Insecurity:   . Worried About Programme researcher, broadcasting/film/video in the Last Year:   . Barista in the Last Year:   Transportation Needs:   . Freight forwarder (Medical):   Marland Kitchen Lack of Transportation (Non-Medical):   Physical Activity:   . Days of Exercise per Week:   . Minutes of Exercise per Session:   Stress:   . Feeling of Stress :   Social Connections:   . Frequency of Communication with Friends and Family:   . Frequency of Social Gatherings with Friends  and Family:   . Attends Religious Services:   . Active Member of Clubs or Organizations:   . Attends Banker Meetings:   Marland Kitchen Marital Status:     No Known Allergies   Current Outpatient Medications:  .  amLODipine (NORVASC) 10 MG tablet, TAKE 1 TABLET(10 MG) BY MOUTH DAILY, Disp: 30 tablet, Rfl: 11 .  DOVATO 50-300 MG TABS, TAKE 1 TABLET BY MOUTH DAILY, Disp: 30 tablet, Rfl: 2 .  hydrochlorothiazide (HYDRODIURIL) 25 MG tablet, Take 1 tablet (25 mg total) by mouth daily., Disp: 30 tablet, Rfl: 11 .  levofloxacin (LEVAQUIN) 500 MG tablet, Take 1 tablet (500 mg total) by mouth daily., Disp: 10 tablet, Rfl: 0 .  lidocaine (LIDODERM) 5 %, Place 1 patch onto the skin daily. Remove & Discard patch within 12 hours or as directed by MD (Patient not taking: Reported on 01/14/2020), Disp: 30 patch, Rfl: 0 .  methocarbamol  (ROBAXIN) 500 MG tablet, Take 1 tablet (500 mg total) by mouth 2 (two) times daily. (Patient not taking: Reported on 01/14/2020), Disp: 20 tablet, Rfl: 0 .  multivitamin-iron-minerals-folic acid (CENTRUM) chewable tablet, Chew 1 tablet by mouth daily., Disp: , Rfl:  .  naproxen (NAPROSYN) 500 MG tablet, Take 1 tablet (500 mg total) by mouth 2 (two) times daily. (Patient not taking: Reported on 01/14/2020), Disp: 30 tablet, Rfl: 0 .  Omega-3 Fatty Acids (FISH OIL) 1200 MG CPDR, Take by mouth., Disp: , Rfl:  .  omeprazole (PRILOSEC) 20 MG capsule, Take 1 capsule (20 mg total) by mouth daily. (Patient not taking: Reported on 01/14/2020), Disp: 30 capsule, Rfl: 0  Lab Results  Component Value Date   HIV1RNAQUANT 21 (H) 08/28/2019   HIV1RNAQUANT <20 NOT DETECTED 04/08/2019   HIV1RNAQUANT <20 DETECTED (A) 12/12/2018      Lab Results  Component Value Date   CD4TABS 905 08/28/2019   CD4TABS 1,016 04/08/2019   CD4TABS 960 12/12/2018   Past Medical History:  Diagnosis Date  . Atypical chest pain 04/08/2019  . Exposure to COVID-19 virus 09/09/2019  . GERD (gastroesophageal reflux disease) 01/07/2019  . Gonorrhea   . HIV (human immunodeficiency virus infection) (HCC)   . Hypertension   . Major depressive disorder, single episode 04/19/2016  . Otitis media 12/16/2014  . Seasonal allergies 12/16/2014  . Sore throat 12/16/2014    No past surgical history on file.  Family History  Problem Relation Age of Onset  . Hypertension Father   . Cancer Maternal Grandfather       Social History   Socioeconomic History  . Marital status: Single    Spouse name: Not on file  . Number of children: Not on file  . Years of education: Not on file  . Highest education level: Not on file  Occupational History  . Not on file  Tobacco Use  . Smoking status: Former Smoker    Packs/day: 0.30    Years: 1.50    Pack years: 0.45    Types: Cigarettes    Quit date: 08/21/2017    Years since quitting: 2.4  .  Smokeless tobacco: Never Used  . Tobacco comment:  4-6 day  Substance and Sexual Activity  . Alcohol use: Yes    Alcohol/week: 0.0 standard drinks    Comment:    weekend  . Drug use: Yes    Frequency: 3.0 times per week    Types: Marijuana    Comment: daily use   . Sexual activity: Yes  Partners: Male    Birth control/protection: None  Other Topics Concern  . Not on file  Social History Narrative   ** Merged History Encounter **       ** Merged History Encounter **       Social Determinants of Health   Financial Resource Strain:   . Difficulty of Paying Living Expenses:   Food Insecurity:   . Worried About Charity fundraiser in the Last Year:   . Arboriculturist in the Last Year:   Transportation Needs:   . Film/video editor (Medical):   Marland Kitchen Lack of Transportation (Non-Medical):   Physical Activity:   . Days of Exercise per Week:   . Minutes of Exercise per Session:   Stress:   . Feeling of Stress :   Social Connections:   . Frequency of Communication with Friends and Family:   . Frequency of Social Gatherings with Friends and Family:   . Attends Religious Services:   . Active Member of Clubs or Organizations:   . Attends Archivist Meetings:   Marland Kitchen Marital Status:     No Known Allergies   Current Outpatient Medications:  .  amLODipine (NORVASC) 10 MG tablet, TAKE 1 TABLET(10 MG) BY MOUTH DAILY, Disp: 30 tablet, Rfl: 11 .  DOVATO 50-300 MG TABS, TAKE 1 TABLET BY MOUTH DAILY, Disp: 30 tablet, Rfl: 2 .  hydrochlorothiazide (HYDRODIURIL) 25 MG tablet, Take 1 tablet (25 mg total) by mouth daily., Disp: 30 tablet, Rfl: 11 .  levofloxacin (LEVAQUIN) 500 MG tablet, Take 1 tablet (500 mg total) by mouth daily., Disp: 10 tablet, Rfl: 0 .  lidocaine (LIDODERM) 5 %, Place 1 patch onto the skin daily. Remove & Discard patch within 12 hours or as directed by MD (Patient not taking: Reported on 01/14/2020), Disp: 30 patch, Rfl: 0 .  methocarbamol (ROBAXIN) 500 MG  tablet, Take 1 tablet (500 mg total) by mouth 2 (two) times daily. (Patient not taking: Reported on 01/14/2020), Disp: 20 tablet, Rfl: 0 .  multivitamin-iron-minerals-folic acid (CENTRUM) chewable tablet, Chew 1 tablet by mouth daily., Disp: , Rfl:  .  naproxen (NAPROSYN) 500 MG tablet, Take 1 tablet (500 mg total) by mouth 2 (two) times daily. (Patient not taking: Reported on 01/14/2020), Disp: 30 tablet, Rfl: 0 .  Omega-3 Fatty Acids (FISH OIL) 1200 MG CPDR, Take by mouth., Disp: , Rfl:  .  omeprazole (PRILOSEC) 20 MG capsule, Take 1 capsule (20 mg total) by mouth daily. (Patient not taking: Reported on 01/14/2020), Disp: 30 capsule, Rfl: 0   Review of Systems  Constitutional: Negative for activity change, appetite change, chills, diaphoresis, fatigue, fever and unexpected weight change.  HENT: Negative for congestion, rhinorrhea, sinus pressure, sneezing, sore throat and trouble swallowing.   Eyes: Negative for photophobia and visual disturbance.  Respiratory: Negative for cough, chest tightness, shortness of breath, wheezing and stridor.   Cardiovascular: Negative for chest pain, palpitations and leg swelling.  Gastrointestinal: Negative for abdominal distention, abdominal pain, anal bleeding, constipation, diarrhea, nausea and vomiting.  Genitourinary: Negative for difficulty urinating, dysuria, flank pain and hematuria.  Musculoskeletal: Negative for gait problem, joint swelling and myalgias.  Skin: Negative for color change, pallor, rash and wound.  Neurological: Negative for dizziness, tremors, weakness and light-headedness.  Hematological: Negative for adenopathy. Does not bruise/bleed easily.  Psychiatric/Behavioral: Negative for agitation, behavioral problems, confusion, decreased concentration, dysphoric mood and sleep disturbance.       Objective:   Physical Exam  Constitutional:  He is oriented to person, place, and time. He appears well-nourished. No distress.  HENT:  Head:  Normocephalic and atraumatic.  Right Ear: No drainage, swelling or tenderness. No decreased hearing is noted.  Left Ear: No drainage, swelling or tenderness. No decreased hearing is noted.  Mouth/Throat: Oropharynx is clear and moist. No oropharyngeal exudate.  Eyes: Conjunctivae and EOM are normal. No scleral icterus.  Neck: No JVD present.  Cardiovascular: Normal rate and regular rhythm.  Pulmonary/Chest: Effort normal. No respiratory distress. He has no wheezes.  Abdominal: He exhibits no distension.  Musculoskeletal:        General: No tenderness or edema.     Right shoulder: No tenderness or bony tenderness. Normal range of motion.     Cervical back: Normal range of motion and neck supple.  Neurological: He is alert and oriented to person, place, and time. He exhibits normal muscle tone. Coordination normal.  Skin: Skin is warm and dry. He is not diaphoretic.  Psychiatric: He has a normal mood and affect. His behavior is normal. Judgment and thought content normal.  Nursing note and vitals reviewed.         Assessment & Plan:    HIV: Dovato check labs and continue on therapy renew HMA P in future  HTN: norvasc 10mg  and hydrochlorothiazide to be continued   Need for Covid vaccination strongly recommended him get an MRI and a 2 dose vaccine  Epididymoorchitis: Screen for STIs, suspect this was an enteric infection causing it

## 2020-01-15 LAB — T-HELPER CELL (CD4) - (RCID CLINIC ONLY)
CD4 % Helper T Cell: 34 % (ref 33–65)
CD4 T Cell Abs: 983 /uL (ref 400–1790)

## 2020-01-16 LAB — COMPLETE METABOLIC PANEL WITH GFR
AG Ratio: 1.4 (calc) (ref 1.0–2.5)
ALT: 20 U/L (ref 9–46)
AST: 19 U/L (ref 10–40)
Albumin: 4.3 g/dL (ref 3.6–5.1)
Alkaline phosphatase (APISO): 63 U/L (ref 36–130)
BUN: 16 mg/dL (ref 7–25)
CO2: 28 mmol/L (ref 20–32)
Calcium: 9.8 mg/dL (ref 8.6–10.3)
Chloride: 103 mmol/L (ref 98–110)
Creat: 1.14 mg/dL (ref 0.60–1.35)
GFR, Est African American: 91 mL/min/{1.73_m2} (ref >=60)
GFR, Est Non African American: 79 mL/min/{1.73_m2} (ref >=60)
Globulin: 3 g/dL (calc) (ref 1.9–3.7)
Glucose, Bld: 103 mg/dL — ABNORMAL HIGH (ref 65–99)
Potassium: 4.1 mmol/L (ref 3.5–5.3)
Sodium: 139 mmol/L (ref 135–146)
Total Bilirubin: 0.7 mg/dL (ref 0.2–1.2)
Total Protein: 7.3 g/dL (ref 6.1–8.1)

## 2020-01-16 LAB — CBC WITH DIFFERENTIAL/PLATELET
Absolute Monocytes: 557 cells/uL (ref 200–950)
Basophils Absolute: 51 cells/uL (ref 0–200)
Basophils Relative: 0.8 %
Eosinophils Absolute: 90 cells/uL (ref 15–500)
Eosinophils Relative: 1.4 %
HCT: 44.6 % (ref 38.5–50.0)
Hemoglobin: 14.7 g/dL (ref 13.2–17.1)
Lymphs Abs: 2963 cells/uL (ref 850–3900)
MCH: 31.5 pg (ref 27.0–33.0)
MCHC: 33 g/dL (ref 32.0–36.0)
MCV: 95.7 fL (ref 80.0–100.0)
MPV: 9.8 fL (ref 7.5–12.5)
Monocytes Relative: 8.7 %
Neutro Abs: 2739 cells/uL (ref 1500–7800)
Neutrophils Relative %: 42.8 %
Platelets: 350 10*3/uL (ref 140–400)
RBC: 4.66 10*6/uL (ref 4.20–5.80)
RDW: 11.6 % (ref 11.0–15.0)
Total Lymphocyte: 46.3 %
WBC: 6.4 10*3/uL (ref 3.8–10.8)

## 2020-01-16 LAB — RPR: RPR Ser Ql: NONREACTIVE

## 2020-01-16 LAB — HIV-1 RNA QUANT-NO REFLEX-BLD
HIV 1 RNA Quant: <20 copies/mL — AB
HIV-1 RNA Quant, Log: <1.3 Log copies/mL — AB

## 2020-02-03 ENCOUNTER — Other Ambulatory Visit: Payer: Self-pay

## 2020-02-06 ENCOUNTER — Other Ambulatory Visit: Payer: Self-pay

## 2020-02-20 ENCOUNTER — Encounter: Payer: Self-pay | Admitting: Infectious Disease

## 2020-02-20 ENCOUNTER — Other Ambulatory Visit: Payer: Self-pay

## 2020-03-05 ENCOUNTER — Encounter: Payer: Self-pay | Admitting: Infectious Disease

## 2020-04-06 ENCOUNTER — Encounter: Payer: Self-pay | Admitting: Infectious Disease

## 2020-04-23 ENCOUNTER — Ambulatory Visit: Payer: Self-pay | Admitting: Pharmacist

## 2020-05-27 ENCOUNTER — Other Ambulatory Visit: Payer: Self-pay

## 2020-05-27 ENCOUNTER — Ambulatory Visit (INDEPENDENT_AMBULATORY_CARE_PROVIDER_SITE_OTHER): Payer: Self-pay | Admitting: Pharmacist

## 2020-05-27 DIAGNOSIS — Z23 Encounter for immunization: Secondary | ICD-10-CM

## 2020-05-27 DIAGNOSIS — B20 Human immunodeficiency virus [HIV] disease: Secondary | ICD-10-CM

## 2020-05-27 MED ORDER — DOVATO 50-300 MG PO TABS: 1.0000 | ORAL_TABLET | Freq: Every day | ORAL | 5 refills | Status: DC

## 2020-05-27 NOTE — Progress Notes (Signed)
HPI: Cody Barton is a 44 y.o. male who presents to the [facility] pharmacy clinic for HIV follow-up.  Patient Active Problem List   Diagnosis Date Noted  . Epididymoorchitis 01/14/2020  . Exposure to COVID-19 virus 09/09/2019  . Atypical chest pain 04/08/2019  . GERD (gastroesophageal reflux disease) 01/07/2019  . Healthcare maintenance 06/12/2018  . Elevated serum creatinine 06/12/2018  . Major depressive disorder, single episode 04/19/2016  . Otitis media 12/16/2014  . Seasonal allergies 12/16/2014  . HIV disease (HCC) 10/10/2012  . HTN (hypertension) 10/10/2012    Patient's Medications  New Prescriptions   No medications on file  Previous Medications   AMLODIPINE (NORVASC) 10 MG TABLET    TAKE 1 TABLET(10 MG) BY MOUTH DAILY   DOVATO 50-300 MG TABS    TAKE 1 TABLET BY MOUTH DAILY   HYDROCHLOROTHIAZIDE (HYDRODIURIL) 25 MG TABLET    Take 1 tablet (25 mg total) by mouth daily.   LEVOFLOXACIN (LEVAQUIN) 500 MG TABLET    Take 1 tablet (500 mg total) by mouth daily.   LIDOCAINE (LIDODERM) 5 %    Place 1 patch onto the skin daily. Remove & Discard patch within 12 hours or as directed by MD   METHOCARBAMOL (ROBAXIN) 500 MG TABLET    Take 1 tablet (500 mg total) by mouth 2 (two) times daily.   MULTIVITAMIN-IRON-MINERALS-FOLIC ACID (CENTRUM) CHEWABLE TABLET    Chew 1 tablet by mouth daily.   NAPROXEN (NAPROSYN) 500 MG TABLET    Take 1 tablet (500 mg total) by mouth 2 (two) times daily.   OMEGA-3 FATTY ACIDS (FISH OIL) 1200 MG CPDR    Take by mouth.   OMEPRAZOLE (PRILOSEC) 20 MG CAPSULE    Take 1 capsule (20 mg total) by mouth daily.  Modified Medications   No medications on file  Discontinued Medications   No medications on file    Allergies: No Known Allergies  Past Medical History: Past Medical History:  Diagnosis Date  . Atypical chest pain 04/08/2019  . Epididymoorchitis 01/14/2020  . Exposure to COVID-19 virus 09/09/2019  . GERD (gastroesophageal reflux disease) 01/07/2019   . Gonorrhea   . HIV (human immunodeficiency virus infection) (HCC)   . Hypertension   . Major depressive disorder, single episode 04/19/2016  . Otitis media 12/16/2014  . Seasonal allergies 12/16/2014  . Sore throat 12/16/2014    Social History: Social History   Socioeconomic History  . Marital status: Single    Spouse name: Not on file  . Number of children: Not on file  . Years of education: Not on file  . Highest education level: Not on file  Occupational History  . Not on file  Tobacco Use  . Smoking status: Former Smoker    Packs/day: 0.30    Years: 1.50    Pack years: 0.45    Types: Cigarettes    Quit date: 08/21/2017    Years since quitting: 2.7  . Smokeless tobacco: Never Used  . Tobacco comment:  4-6 day  Vaping Use  . Vaping Use: Never used  Substance and Sexual Activity  . Alcohol use: Yes    Alcohol/week: 0.0 standard drinks    Comment:    weekend  . Drug use: Yes    Frequency: 3.0 times per week    Types: Marijuana    Comment: daily use   . Sexual activity: Yes    Partners: Male    Birth control/protection: None  Other Topics Concern  . Not on file  Social History Narrative   ** Merged History Encounter **       ** Merged History Encounter **       Social Determinants of Health   Financial Resource Strain:   . Difficulty of Paying Living Expenses: Not on file  Food Insecurity:   . Worried About Programme researcher, broadcasting/film/video in the Last Year: Not on file  . Ran Out of Food in the Last Year: Not on file  Transportation Needs:   . Lack of Transportation (Medical): Not on file  . Lack of Transportation (Non-Medical): Not on file  Physical Activity:   . Days of Exercise per Week: Not on file  . Minutes of Exercise per Session: Not on file  Stress:   . Feeling of Stress : Not on file  Social Connections:   . Frequency of Communication with Friends and Family: Not on file  . Frequency of Social Gatherings with Friends and Family: Not on file  . Attends  Religious Services: Not on file  . Active Member of Clubs or Organizations: Not on file  . Attends Banker Meetings: Not on file  . Marital Status: Not on file    Labs: Lab Results  Component Value Date   HIV1RNAQUANT <20 DETECTED (A) 01/14/2020   HIV1RNAQUANT 21 (H) 08/28/2019   HIV1RNAQUANT <20 NOT DETECTED 04/08/2019   CD4TABS 983 01/14/2020   CD4TABS 905 08/28/2019   CD4TABS 1,016 04/08/2019    RPR and STI Lab Results  Component Value Date   LABRPR NON-REACTIVE 01/14/2020   LABRPR NON-REACTIVE 08/28/2019   LABRPR NON-REACTIVE 04/08/2019   LABRPR NON-REACTIVE 12/12/2018   LABRPR NON-REACTIVE 09/04/2018   RPRTITER 1:1 03/01/2016    STI Results GC GC CT CT  Latest Ref Rng & Units - NEGATIVE - NEGATIVE  11/29/2019 Negative - Negative -  04/08/2019 Negative - Negative -  09/04/2018 Negative - Negative -  03/06/2018 Negative - Negative -  03/06/2018 **POSITIVE**(A) - Negative -  02/20/2018 Negative - Negative -  09/04/2017 Negative - Negative -  08/09/2017 Negative - Negative -  02/21/2017 Negative - Negative -  02/21/2017 **POSITIVE**(A) - Negative -  02/21/2017 Negative - Negative -  01/19/2017 Negative - Negative -  04/19/2016 *Negative - *Negative -  04/19/2016 *Negative - *Negative -  03/01/2016 Negative - Negative -  09/16/2015 *Negative - *Negative -  09/16/2015 *Negative - *Negative -  09/16/2015 Negative - **POSITIVE**(A) -  02/18/2015 Negative - Negative -  02/02/2015 **POSITIVE**(A) - Negative -    Hepatitis B Lab Results  Component Value Date   HEPBSAB NEG 06/10/2013   HEPBSAG NEGATIVE 08/30/2012   HEPBCAB NEG 08/30/2012   Hepatitis C No results found for: HEPCAB, HCVRNAPCRQN Hepatitis A Lab Results  Component Value Date   HAV NEG 08/30/2012   Lipids: Lab Results  Component Value Date   CHOL 166 09/04/2018   TRIG 556 (H) 09/04/2018   HDL 31 (L) 09/04/2018   CHOLHDL 5.4 (H) 09/04/2018   VLDL 21 01/19/2017   LDLCALC  09/04/2018     Comment:      . LDL cholesterol not calculated. Triglyceride levels greater than 400 mg/dL invalidate calculated LDL results. . Reference range: <100 . Desirable range <100 mg/dL for primary prevention;   <70 mg/dL for patients with CHD or diabetic patients  with > or = 2 CHD risk factors. Marland Kitchen LDL-C is now calculated using the Martin-Hopkins  calculation, which is a validated novel method providing  better accuracy than the [employer]  equation in the  estimation of LDL-C.  Horald Pollen et al. Lenox Ahr. 7793;903(00): 2061-2068  (http://education.[facility].com/faq/FAQ164)     Current HIV Regimen: Dovato  Assessment: Cody Barton is here today to follow up for his HIV infection. He has missed several appointments with Dr. Daiva Eves and was last seen in May. He came in today and renewed his Alycia Rossetti White/HMAP as well.   He states that he is doing well on the Dovato. He tolerates it well but it does make him sleepy. He is trying to figure out the best time of day to take it and has started taking it with dinner. 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 He misses about 5 doses every month. Counseled him to try and get this number down to close to zero. He states that he will try harder. I especially worry about this with Dovato. Otherwise, no issues. He has been sexually active and is requesting STI testing today.  Will check blood work and have him come back and see Dr. Daiva Eves in January when he needs to renew his Lenward Chancellor again. Will bring him back sooner if his labs indicate the need.  I gave him his flu shot today.  Plan: - Continue Dovato, refills sent to [facility] - HIV viral load, CD4, CBC, CMET, urine cytology, RPR today - Flu shot - F/u with Dr. Daiva Eves in January  Mileydi Milsap L. Jannette Fogo, PharmD, BCIDP, AAHIVP, CPP Clinical Pharmacist Practitioner Infectious Diseases Clinical Pharmacist [facility] 05/27/2020, 11:30 AM

## 2020-05-28 ENCOUNTER — Telehealth: Payer: Self-pay | Admitting: Pharmacist

## 2020-05-28 LAB — T-HELPER CELL (CD4) - (RCID CLINIC ONLY)
CD4 % Helper T Cell: 33 % (ref 33–65)
CD4 T Cell Abs: 977 /uL (ref 400–1790)

## 2020-05-28 LAB — URINE CYTOLOGY ANCILLARY ONLY
Chlamydia: NEGATIVE
Comment: NEGATIVE
Comment: NORMAL
Neisseria Gonorrhea: NEGATIVE

## 2020-05-28 NOTE — Telephone Encounter (Signed)
Patient of Dr. 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 He was last negative in May 2021. RPR from yesterday is 1:32. Called patient to explain. He has never been diagnosed with syphilis. He has no rash or ulcers. He wanted first available appointment ASAP. First available for RN schedule is Tuesday 10/12 at 1130am. Advised to abstain from sexual activity for 10 days after treatment and to get his partners tested and treated. He has no allergies to penicillin.   He will need Bicillin 2.4 million units x 1. Asked him to call me with any questions.

## 2020-05-30 LAB — COMPREHENSIVE METABOLIC PANEL
AG Ratio: 1.3 (calc) (ref 1.0–2.5)
ALT: 22 U/L (ref 9–46)
AST: 21 U/L (ref 10–40)
Albumin: 4.6 g/dL (ref 3.6–5.1)
Alkaline phosphatase (APISO): 69 U/L (ref 36–130)
BUN: 14 mg/dL (ref 7–25)
CO2: 28 mmol/L (ref 20–32)
Calcium: 10 mg/dL (ref 8.6–10.3)
Chloride: 102 mmol/L (ref 98–110)
Creat: 1.04 mg/dL (ref 0.60–1.35)
Globulin: 3.5 g/dL (calc) (ref 1.9–3.7)
Glucose, Bld: 90 mg/dL (ref 65–99)
Potassium: 4.2 mmol/L (ref 3.5–5.3)
Sodium: 139 mmol/L (ref 135–146)
Total Bilirubin: 1 mg/dL (ref 0.2–1.2)
Total Protein: 8.1 g/dL (ref 6.1–8.1)

## 2020-05-30 LAB — CBC
HCT: 45.1 % (ref 38.5–50.0)
Hemoglobin: 15 g/dL (ref 13.2–17.1)
MCH: 31.8 pg (ref 27.0–33.0)
MCHC: 33.3 g/dL (ref 32.0–36.0)
MCV: 95.6 fL (ref 80.0–100.0)
MPV: 9.8 fL (ref 7.5–12.5)
Platelets: 310 10*3/uL (ref 140–400)
RBC: 4.72 10*6/uL (ref 4.20–5.80)
RDW: 11.3 % (ref 11.0–15.0)
WBC: 6.7 10*3/uL (ref 3.8–10.8)

## 2020-05-30 LAB — FLUORESCENT TREPONEMAL AB(FTA)-IGG-BLD: Fluorescent Treponemal ABS: REACTIVE — AB

## 2020-05-30 LAB — RPR: RPR Ser Ql: REACTIVE — AB

## 2020-05-30 LAB — HIV-1 RNA QUANT-NO REFLEX-BLD
HIV 1 RNA Quant: <20 Copies/mL
HIV-1 RNA Quant, Log: <1.3 Log cps/mL

## 2020-05-30 LAB — RPR TITER: RPR Titer: 1:32 {titer} — ABNORMAL HIGH

## 2020-06-01 NOTE — Telephone Encounter (Signed)
Thanks Cassie 

## 2020-06-02 ENCOUNTER — Other Ambulatory Visit: Payer: Self-pay

## 2020-06-02 ENCOUNTER — Ambulatory Visit (INDEPENDENT_AMBULATORY_CARE_PROVIDER_SITE_OTHER): Payer: Self-pay

## 2020-06-02 DIAGNOSIS — A539 Syphilis, unspecified: Secondary | ICD-10-CM

## 2020-06-02 MED ORDER — PENICILLIN G BENZATHINE 1200000 UNIT/2ML IM SUSP
1.2000 10*6.[IU] | Freq: Once | INTRAMUSCULAR | Status: AC
  Administered 2020-06-02: 1.2 10*6.[IU] via INTRAMUSCULAR

## 2020-06-02 NOTE — Progress Notes (Signed)
Patient tolerated Bicillin injections well. Reinforced abstinence for 10 days after treatment, offered condoms and encouraged use. Patient verbalized understanding.   Martita Brumm D Shiara Mcgough, RN   

## 2020-06-05 ENCOUNTER — Encounter: Payer: Self-pay | Admitting: Infectious Disease

## 2020-08-31 ENCOUNTER — Ambulatory Visit: Payer: Self-pay

## 2020-08-31 ENCOUNTER — Ambulatory Visit (INDEPENDENT_AMBULATORY_CARE_PROVIDER_SITE_OTHER): Payer: Self-pay | Admitting: Infectious Disease

## 2020-08-31 ENCOUNTER — Other Ambulatory Visit: Payer: Self-pay

## 2020-08-31 ENCOUNTER — Encounter: Payer: Self-pay | Admitting: Infectious Disease

## 2020-08-31 VITALS — BP 131/83 | HR 78 | Temp 98.4°F | Wt 206.0 lb

## 2020-08-31 DIAGNOSIS — K219 Gastro-esophageal reflux disease without esophagitis: Secondary | ICD-10-CM

## 2020-08-31 DIAGNOSIS — Z23 Encounter for immunization: Secondary | ICD-10-CM

## 2020-08-31 DIAGNOSIS — R0789 Other chest pain: Secondary | ICD-10-CM

## 2020-08-31 DIAGNOSIS — I10 Essential (primary) hypertension: Secondary | ICD-10-CM

## 2020-08-31 DIAGNOSIS — Z7185 Encounter for immunization safety counseling: Secondary | ICD-10-CM | POA: Insufficient documentation

## 2020-08-31 DIAGNOSIS — B20 Human immunodeficiency virus [HIV] disease: Secondary | ICD-10-CM

## 2020-08-31 HISTORY — DX: Encounter for immunization safety counseling: Z71.85

## 2020-08-31 MED ORDER — OMEPRAZOLE 20 MG PO CPDR: 20.0000 mg | DELAYED_RELEASE_CAPSULE | Freq: Every day | ORAL | 11 refills | Status: DC

## 2020-08-31 NOTE — Progress Notes (Signed)
Subjective:   Chief complaint: Atypical chest pain   Patient ID: Cody MoccasinConstantine Milone, male    DOB: [birth year], 45 y.o.   MRN: [id number]  HPI  45 year-old African-American with HIV infection acquired infection in whom we had started Complera and who has had perfect virological suppression. He admitted that he sometimes was taking the complera on an empty stomach which I said was absolutely a bad idea and we  [facility] and he has maintained perfect virological suppression since then.   [facility] and he has made perfect virological suppression  Lonia FarberConstantine is having some atypical chest pain has been gone for several years.  Pain is in the center of his chest can happen at any moment in time though he said it was recently precipitated by drinking a margarita mix.  Pain can radiate into his throat but not to the shoulders is not accompanied by diaphoresis or shortness of breath.   He is wondering if it might be related to heartburn and certainly can trial him on a PPI his EKG in April had shown on his normal sinus rhythm.  He has been seen in the urgent care several times before for the same symptom.  I think he deserves a formal evaluation by cardiology at some point.  Quit smoking but does still smoke marijuana products.       Past Medical History:  Diagnosis Date  . Atypical chest pain 04/08/2019  . Epididymoorchitis 01/14/2020  . Exposure to COVID-19 virus 09/09/2019  . GERD (gastroesophageal reflux disease) 01/07/2019  . Gonorrhea   . HIV (human immunodeficiency virus infection) (HCC)   . Hypertension   . Major depressive disorder, single episode 04/19/2016  . Otitis media 12/16/2014  . Seasonal allergies 12/16/2014  . Sore throat 12/16/2014    No past surgical history on file.  Family History  Problem Relation Age of Onset  . Hypertension Father   . Cancer Maternal Grandfather       Social History   Socioeconomic History  . Marital  status: Single    Spouse name: Not on file  . Number of children: Not on file  . Years of education: Not on file  . Highest education level: Not on file  Occupational History  . Not on file  Tobacco Use  . Smoking status: Former Smoker    Packs/day: 0.30    Years: 1.50    Pack years: 0.45    Types: Cigarettes    Quit date: 08/21/2017    Years since quitting: 3.0  . Smokeless tobacco: Never Used  . Tobacco comment:  4-6 day  Vaping Use  . Vaping Use: Never used  Substance and Sexual Activity  . Alcohol use: Yes    Alcohol/week: 0.0 standard drinks    Comment:    weekend  . Drug use: Yes    Frequency: 3.0 times per week    Types: Marijuana    Comment: daily use   . Sexual activity: Yes    Partners: Male    Birth control/protection: None  Other Topics Concern  . Not on file  Social History Narrative   ** Merged History Encounter **       ** Merged History Encounter **       Social Determinants of Health   Financial Resource Strain: Not on file  Food Insecurity: Not on file  Transportation Needs: Not on file  Physical Activity: Not on file  Stress: Not on file  Social  Connections: Not on file    No Known Allergies   Current Outpatient Medications:  .  amLODipine (NORVASC) 10 MG tablet, TAKE 1 TABLET(10 MG) BY MOUTH DAILY, Disp: 30 tablet, Rfl: 11 .  Dolutegravir-lamiVUDine (DOVATO) 50-300 MG TABS, Take 1 tablet by mouth daily., Disp: 30 tablet, Rfl: 5 .  hydrochlorothiazide (HYDRODIURIL) 25 MG tablet, Take 1 tablet (25 mg total) by mouth daily., Disp: 30 tablet, Rfl: 11 .  levofloxacin (LEVAQUIN) 500 MG tablet, Take 1 tablet (500 mg total) by mouth daily., Disp: 10 tablet, Rfl: 0 .  lidocaine (LIDODERM) 5 %, Place 1 patch onto the skin daily. Remove & Discard patch within 12 hours or as directed by MD (Patient not taking: Reported on 01/14/2020), Disp: 30 patch, Rfl: 0 .  methocarbamol (ROBAXIN) 500 MG tablet, Take 1 tablet (500 mg total) by mouth 2 (two) times  daily. (Patient not taking: Reported on 01/14/2020), Disp: 20 tablet, Rfl: 0 .  multivitamin-iron-minerals-folic acid (CENTRUM) chewable tablet, Chew 1 tablet by mouth daily., Disp: , Rfl:  .  naproxen (NAPROSYN) 500 MG tablet, Take 1 tablet (500 mg total) by mouth 2 (two) times daily. (Patient not taking: Reported on 01/14/2020), Disp: 30 tablet, Rfl: 0 .  Omega-3 Fatty Acids (FISH OIL) 1200 MG CPDR, Take by mouth., Disp: , Rfl:  .  omeprazole (PRILOSEC) 20 MG capsule, Take 1 capsule (20 mg total) by mouth daily. (Patient not taking: Reported on 01/14/2020), Disp: 30 capsule, Rfl: 0  Lab Results  Component Value Date   HIV1RNAQUANT <20 05/27/2020   HIV1RNAQUANT <20 DETECTED (A) 01/14/2020   HIV1RNAQUANT 21 (H) 08/28/2019      Lab Results  Component Value Date   CD4TABS 977 05/27/2020   CD4TABS 983 01/14/2020   CD4TABS 905 08/28/2019   Past Medical History:  Diagnosis Date  . Atypical chest pain 04/08/2019  . Epididymoorchitis 01/14/2020  . Exposure to COVID-19 virus 09/09/2019  . GERD (gastroesophageal reflux disease) 01/07/2019  . Gonorrhea   . HIV (human immunodeficiency virus infection) (HCC)   . Hypertension   . Major depressive disorder, single episode 04/19/2016  . Otitis media 12/16/2014  . Seasonal allergies 12/16/2014  . Sore throat 12/16/2014    No past surgical history on file.  Family History  Problem Relation Age of Onset  . Hypertension Father   . Cancer Maternal Grandfather       Social History   Socioeconomic History  . Marital status: Single    Spouse name: Not on file  . Number of children: Not on file  . Years of education: Not on file  . Highest education level: Not on file  Occupational History  . Not on file  Tobacco Use  . Smoking status: Former Smoker    Packs/day: 0.30    Years: 1.50    Pack years: 0.45    Types: Cigarettes    Quit date: 08/21/2017    Years since quitting: 3.0  . Smokeless tobacco: Never Used  . Tobacco comment:  4-6 day   Vaping Use  . Vaping Use: Never used  Substance and Sexual Activity  . Alcohol use: Yes    Alcohol/week: 0.0 standard drinks    Comment:    weekend  . Drug use: Yes    Frequency: 3.0 times per week    Types: Marijuana    Comment: daily use   . Sexual activity: Yes    Partners: Male    Birth control/protection: None  Other Topics Concern  . Not  on file  Social History Narrative   ** Merged History Encounter **       ** Merged History Encounter **       Social Determinants of Corporate investment banker Strain: Not on file  Food Insecurity: Not on file  Transportation Needs: Not on file  Physical Activity: Not on file  Stress: Not on file  Social Connections: Not on file    No Known Allergies   Current Outpatient Medications:  .  amLODipine (NORVASC) 10 MG tablet, TAKE 1 TABLET(10 MG) BY MOUTH DAILY, Disp: 30 tablet, Rfl: 11 .  Dolutegravir-lamiVUDine (DOVATO) 50-300 MG TABS, Take 1 tablet by mouth daily., Disp: 30 tablet, Rfl: 5 .  hydrochlorothiazide (HYDRODIURIL) 25 MG tablet, Take 1 tablet (25 mg total) by mouth daily., Disp: 30 tablet, Rfl: 11 .  levofloxacin (LEVAQUIN) 500 MG tablet, Take 1 tablet (500 mg total) by mouth daily., Disp: 10 tablet, Rfl: 0 .  lidocaine (LIDODERM) 5 %, Place 1 patch onto the skin daily. Remove & Discard patch within 12 hours or as directed by MD (Patient not taking: Reported on 01/14/2020), Disp: 30 patch, Rfl: 0 .  methocarbamol (ROBAXIN) 500 MG tablet, Take 1 tablet (500 mg total) by mouth 2 (two) times daily. (Patient not taking: Reported on 01/14/2020), Disp: 20 tablet, Rfl: 0 .  multivitamin-iron-minerals-folic acid (CENTRUM) chewable tablet, Chew 1 tablet by mouth daily., Disp: , Rfl:  .  naproxen (NAPROSYN) 500 MG tablet, Take 1 tablet (500 mg total) by mouth 2 (two) times daily. (Patient not taking: Reported on 01/14/2020), Disp: 30 tablet, Rfl: 0 .  Omega-3 Fatty Acids (FISH OIL) 1200 MG CPDR, Take by mouth., Disp: , Rfl:  .   omeprazole (PRILOSEC) 20 MG capsule, Take 1 capsule (20 mg total) by mouth daily. (Patient not taking: Reported on 01/14/2020), Disp: 30 capsule, Rfl: 0   Review of Systems  Constitutional: Negative for activity change, appetite change, chills, diaphoresis, fatigue, fever and unexpected weight change.  HENT: Negative for congestion, rhinorrhea, sinus pressure, sneezing, sore throat and trouble swallowing.   Eyes: Negative for photophobia and visual disturbance.  Respiratory: Negative for cough, chest tightness, shortness of breath, wheezing and stridor.   Cardiovascular: Positive for chest pain. Negative for palpitations and leg swelling.  Gastrointestinal: Negative for abdominal distention, abdominal pain, anal bleeding, constipation, diarrhea, nausea and vomiting.  Genitourinary: Negative for difficulty urinating, dysuria, flank pain and hematuria.  Musculoskeletal: Negative for gait problem, joint swelling and myalgias.  Skin: Negative for color change, pallor, rash and wound.  Neurological: Negative for dizziness, tremors, weakness and light-headedness.  Hematological: Negative for adenopathy. Does not bruise/bleed easily.  Psychiatric/Behavioral: Negative for agitation, behavioral problems, confusion, decreased concentration, dysphoric mood and sleep disturbance.       Objective:   Physical Exam Vitals and nursing note reviewed.  Constitutional:      General: He is not in acute distress.    Appearance: He is not diaphoretic.  HENT:     Head: Normocephalic and atraumatic.     Right Ear: No decreased hearing noted. No drainage, swelling or tenderness.     Left Ear: No decreased hearing noted. No drainage, swelling or tenderness.     Mouth/Throat:     Pharynx: No oropharyngeal exudate.  Eyes:     General: No scleral icterus.    Conjunctiva/sclera: Conjunctivae normal.  Neck:     Vascular: No JVD.  Cardiovascular:     Rate and Rhythm: Normal rate and regular rhythm.  Heart  sounds: Normal heart sounds. No murmur heard. No friction rub. No gallop.   Pulmonary:     Effort: Pulmonary effort is normal. No respiratory distress.     Breath sounds: Normal breath sounds. No stridor. No wheezing, rhonchi or rales.  Chest:     Chest wall: No tenderness.  Abdominal:     General: There is no distension.  Musculoskeletal:        General: No tenderness.     Right shoulder: No tenderness or bony tenderness. Normal range of motion.     Cervical back: Normal range of motion and neck supple.  Skin:    General: Skin is warm and dry.  Neurological:     Mental Status: He is alert and oriented to person, place, and time.     Motor: No abnormal muscle tone.     Coordination: Coordination normal.  Psychiatric:        Mood and Affect: Mood normal.        Behavior: Behavior normal.        Thought Content: Thought content normal.        Judgment: Judgment normal.           Assessment & Plan:   Atypical chest pain: He can try a trial of PPI therapy if this does not improve things I think he clearly needs to be evaluated by cardiology given how long he has been suffering from the symptoms and not ever had a formal evaluation from them.   He does not have too  risk factors but we now are thinking that HIV itself may be a risk factor he certainly getting older he is a former smoker.  HIV disease continue Dovato renew HIV medication assistance program.   HTN: norvasc 10mg  and hydrochlorothiazide to be continued Prevention I have counseled him extensively to get COVID-19 vaccinated and offered him a Pfizer vaccine which we had today.  He said that he would think about it now that I had talked about it to him more extensively.  I spent greater than 40 minutes with the patient including greater than 50% of time in face to face counsel of the patient regarding how vaccines been studied for COVID-19 how they work, nature of Covid infection prevention nature of influenza  prevention work-up for atypical chest pain and coordination of his care.

## 2020-10-13 ENCOUNTER — Other Ambulatory Visit: Payer: Self-pay | Admitting: Infectious Disease

## 2020-10-13 DIAGNOSIS — A549 Gonococcal infection, unspecified: Secondary | ICD-10-CM

## 2020-10-13 DIAGNOSIS — B20 Human immunodeficiency virus [HIV] disease: Secondary | ICD-10-CM

## 2020-11-20 ENCOUNTER — Encounter: Payer: Self-pay | Admitting: Infectious Disease

## 2021-03-01 ENCOUNTER — Telehealth: Payer: Self-pay

## 2021-03-01 ENCOUNTER — Ambulatory Visit: Payer: Self-pay | Admitting: Infectious Disease

## 2021-03-01 NOTE — Telephone Encounter (Signed)
Called patient regarding today's missed appointment, no answer. Left HIPAA compliant voicemail requesting callback.   Nashya Garlington D Candela Krul, RN  

## 2021-03-04 ENCOUNTER — Other Ambulatory Visit: Payer: Self-pay

## 2021-03-19 ENCOUNTER — Other Ambulatory Visit: Payer: Self-pay

## 2021-03-19 DIAGNOSIS — B20 Human immunodeficiency virus [HIV] disease: Secondary | ICD-10-CM

## 2021-03-23 ENCOUNTER — Ambulatory Visit: Payer: Self-pay

## 2021-03-23 ENCOUNTER — Other Ambulatory Visit: Payer: Self-pay

## 2021-03-23 ENCOUNTER — Ambulatory Visit (INDEPENDENT_AMBULATORY_CARE_PROVIDER_SITE_OTHER): Payer: Self-pay

## 2021-03-23 ENCOUNTER — Ambulatory Visit (INDEPENDENT_AMBULATORY_CARE_PROVIDER_SITE_OTHER): Payer: Self-pay | Admitting: Infectious Disease

## 2021-03-23 ENCOUNTER — Encounter: Payer: Self-pay | Admitting: Infectious Disease

## 2021-03-23 VITALS — BP 124/75 | HR 67 | Temp 98.0°F | Wt 210.0 lb

## 2021-03-23 DIAGNOSIS — Z7185 Encounter for immunization safety counseling: Secondary | ICD-10-CM

## 2021-03-23 DIAGNOSIS — Z23 Encounter for immunization: Secondary | ICD-10-CM

## 2021-03-23 DIAGNOSIS — I10 Essential (primary) hypertension: Secondary | ICD-10-CM

## 2021-03-23 DIAGNOSIS — A549 Gonococcal infection, unspecified: Secondary | ICD-10-CM

## 2021-03-23 DIAGNOSIS — R0789 Other chest pain: Secondary | ICD-10-CM

## 2021-03-23 DIAGNOSIS — K219 Gastro-esophageal reflux disease without esophagitis: Secondary | ICD-10-CM

## 2021-03-23 DIAGNOSIS — F320 Major depressive disorder, single episode, mild: Secondary | ICD-10-CM

## 2021-03-23 DIAGNOSIS — B20 Human immunodeficiency virus [HIV] disease: Secondary | ICD-10-CM

## 2021-03-23 DIAGNOSIS — J302 Other seasonal allergic rhinitis: Secondary | ICD-10-CM

## 2021-03-23 LAB — CBC WITH DIFFERENTIAL/PLATELET
Absolute Monocytes: 519 cells/uL (ref 200–950)
Basophils Absolute: 67 cells/uL (ref 0–200)
Basophils Relative: 1.1 %
Eosinophils Absolute: 79 cells/uL (ref 15–500)
Eosinophils Relative: 1.3 %
HCT: 44.6 % (ref 38.5–50.0)
Hemoglobin: 15 g/dL (ref 13.2–17.1)
Lymphs Abs: 2483 cells/uL (ref 850–3900)
MCH: 31.8 pg (ref 27.0–33.0)
MCHC: 33.6 g/dL (ref 32.0–36.0)
MCV: 94.7 fL (ref 80.0–100.0)
MPV: 9.9 fL (ref 7.5–12.5)
Monocytes Relative: 8.5 %
Neutro Abs: 2952 cells/uL (ref 1500–7800)
Neutrophils Relative %: 48.4 %
Platelets: 328 10*3/uL (ref 140–400)
RBC: 4.71 10*6/uL (ref 4.20–5.80)
RDW: 11 % (ref 11.0–15.0)
Total Lymphocyte: 40.7 %
WBC: 6.1 10*3/uL (ref 3.8–10.8)

## 2021-03-23 LAB — COMPLETE METABOLIC PANEL WITH GFR
AG Ratio: 1.3 (calc) (ref 1.0–2.5)
ALT: 22 U/L (ref 9–46)
AST: 17 U/L (ref 10–40)
Albumin: 4.4 g/dL (ref 3.6–5.1)
Alkaline phosphatase (APISO): 66 U/L (ref 36–130)
BUN: 21 mg/dL (ref 7–25)
CO2: 29 mmol/L (ref 20–32)
Calcium: 10.1 mg/dL (ref 8.6–10.3)
Chloride: 100 mmol/L (ref 98–110)
Creat: 1.12 mg/dL (ref 0.60–1.29)
Globulin: 3.5 g/dL (calc) (ref 1.9–3.7)
Glucose, Bld: 146 mg/dL — ABNORMAL HIGH (ref 65–99)
Potassium: 3.7 mmol/L (ref 3.5–5.3)
Sodium: 136 mmol/L (ref 135–146)
Total Bilirubin: 0.5 mg/dL (ref 0.2–1.2)
Total Protein: 7.9 g/dL (ref 6.1–8.1)
eGFR: 83 mL/min/{1.73_m2} (ref >=60)

## 2021-03-23 LAB — FLUORESCENT TREPONEMAL AB(FTA)-IGG-BLD: Fluorescent Treponemal ABS: REACTIVE — AB

## 2021-03-23 LAB — T-HELPER CELLS (CD4) COUNT (NOT AT ARMC)
Absolute CD4: 843 cells/uL (ref 490–1740)
CD4 T Helper %: 31 % (ref 30–61)
Total lymphocyte count: 2730 cells/uL (ref 850–3900)

## 2021-03-23 LAB — RPR TITER: RPR Titer: 1:1 {titer} — ABNORMAL HIGH

## 2021-03-23 LAB — HIV-1 RNA QUANT-NO REFLEX-BLD
HIV 1 RNA Quant: NOT DETECTED Copies/mL
HIV-1 RNA Quant, Log: NOT DETECTED Log cps/mL

## 2021-03-23 LAB — RPR: RPR Ser Ql: REACTIVE — AB

## 2021-03-23 MED ORDER — HYDROCHLOROTHIAZIDE 25 MG PO TABS: ORAL_TABLET | ORAL | 11 refills | Status: DC

## 2021-03-23 MED ORDER — DOVATO 50-300 MG PO TABS: 1.0000 | ORAL_TABLET | Freq: Every day | ORAL | 5 refills | Status: DC

## 2021-03-23 MED ORDER — AMLODIPINE BESYLATE 10 MG PO TABS: 10.0000 mg | ORAL_TABLET | Freq: Every day | ORAL | 11 refills | Status: DC

## 2021-03-23 NOTE — Progress Notes (Signed)
Subjective:  Chief complaint: followup for HIV disease on medications   Patient ID: Cody Barton, male    DOB: 08/28/1975, 45 y.o.   MRN: [id number]  HPI  Cody Barton is a 45 year old Black man living with HIV most recently perfectly controlled on Dovato  He has had some atypical chest pain and I rx him PPI for this in case this was GERD.   Past Medical History:  Diagnosis Date   Atypical chest pain 04/08/2019   Epididymoorchitis 01/14/2020   Exposure to COVID-19 virus 09/09/2019   GERD (gastroesophageal reflux disease) 01/07/2019   Gonorrhea    HIV (human immunodeficiency virus infection) (HCC)    Hypertension    Major depressive disorder, single episode 04/19/2016   Otitis media 12/16/2014   Seasonal allergies 12/16/2014   Sore throat 12/16/2014   Vaccine counseling 08/31/2020    No past surgical history on file.  Family History  Problem Relation Age of Onset   Hypertension Father    Cancer Maternal Grandfather       Social History   Socioeconomic History   Marital status: Single    Spouse name: Not on file   Number of children: Not on file   Years of education: Not on file   Highest education level: Not on file  Occupational History   Not on file  Tobacco Use   Smoking status: Former    Packs/day: 0.30    Years: 1.50    Pack years: 0.45    Types: Cigars, Cigarettes    Quit date: 08/21/2017    Years since quitting: 3.5   Smokeless tobacco: Never  Vaping Use   Vaping Use: Never used  Substance and Sexual Activity   Alcohol use: Yes    Alcohol/week: 0.0 standard drinks    Comment:    weekend   Drug use: Yes    Frequency: 3.0 times per week    Types: Marijuana    Comment: daily use    Sexual activity: Yes    Partners: Male    Birth control/protection: None    Comment: declined condoms  Other Topics Concern   Not on file  Social History Narrative   ** Merged History Encounter **       ** Merged History Encounter **       Social Determinants  of Health   Financial Resource Strain: Not on file  Food Insecurity: Not on file  Transportation Needs: Not on file  Physical Activity: Not on file  Stress: Not on file  Social Connections: Not on file    No Known Allergies   Current Outpatient Medications:    lidocaine (LIDODERM) 5 %, Place 1 patch onto the skin daily. Remove & Discard patch within 12 hours or as directed by MD, Disp: 30 patch, Rfl: 0   multivitamin-iron-minerals-folic acid (CENTRUM) chewable tablet, Chew 1 tablet by mouth daily., Disp: , Rfl:    naproxen (NAPROSYN) 500 MG tablet, Take 1 tablet (500 mg total) by mouth 2 (two) times daily., Disp: 30 tablet, Rfl: 0   Omega-3 Fatty Acids (FISH OIL) 1200 MG CPDR, Take by mouth., Disp: , Rfl:    omeprazole (PRILOSEC) 20 MG capsule, Take 1 capsule (20 mg total) by mouth daily., Disp: 30 capsule, Rfl: 0   amLODipine (NORVASC) 10 MG tablet, Take 1 tablet (10 mg total) by mouth daily., Disp: 30 tablet, Rfl: 11   Dolutegravir-lamiVUDine (DOVATO) 50-300 MG TABS, Take 1 tablet by mouth daily., Disp: 30 tablet, Rfl: 5  hydrochlorothiazide (HYDRODIURIL) 25 MG tablet, TAKE 1 TABLET(25 MG) BY MOUTH DAILY, Disp: 30 tablet, Rfl: 11   methocarbamol (ROBAXIN) 500 MG tablet, Take 1 tablet (500 mg total) by mouth 2 (two) times daily. (Patient not taking: Reported on 03/23/2021), Disp: 20 tablet, Rfl: 0   omeprazole (PRILOSEC) 20 MG capsule, Take 1 capsule (20 mg total) by mouth daily. (Patient not taking: Reported on 03/23/2021), Disp: 30 capsule, Rfl: 11    Review of Systems  Constitutional:  Negative for chills and fever.  HENT:  Negative for congestion and sore throat.   Eyes:  Negative for photophobia.  Respiratory:  Negative for cough, shortness of breath and wheezing.   Cardiovascular:  Negative for chest pain, palpitations and leg swelling.  Gastrointestinal:  Negative for abdominal pain, blood in stool, constipation, diarrhea, nausea and vomiting.  Genitourinary:  Negative for  dysuria, flank pain and hematuria.  Musculoskeletal:  Negative for back pain and myalgias.  Skin:  Negative for rash.  Neurological:  Negative for dizziness, weakness and headaches.  Hematological:  Does not bruise/bleed easily.  Psychiatric/Behavioral:  Negative for suicidal ideas.       Objective:   Physical Exam Constitutional:      General: He is not in acute distress.    Appearance: Normal appearance. He is well-developed. He is not ill-appearing or diaphoretic.  HENT:     Head: Normocephalic and atraumatic.     Right Ear: Hearing and external ear normal.     Left Ear: Hearing and external ear normal.     Nose: No nasal deformity or rhinorrhea.  Eyes:     General: No scleral icterus.    Conjunctiva/sclera: Conjunctivae normal.     Right eye: Right conjunctiva is not injected.     Left eye: Left conjunctiva is not injected.     Pupils: Pupils are equal, round, and reactive to light.  Neck:     Vascular: No JVD.  Cardiovascular:     Rate and Rhythm: Normal rate and regular rhythm.     Heart sounds: S1 normal and S2 normal.  Pulmonary:     Effort: Pulmonary effort is normal. No respiratory distress.     Breath sounds: No wheezing.  Abdominal:     General: There is no distension.     Palpations: Abdomen is soft.  Musculoskeletal:        General: Normal range of motion.     Right shoulder: Normal.     Left shoulder: Normal.     Cervical back: Normal range of motion and neck supple.     Right hip: Normal.     Left hip: Normal.     Right knee: Normal.     Left knee: Normal.  Lymphadenopathy:     Head:     Right side of head: No submandibular, preauricular or posterior auricular adenopathy.     Left side of head: No submandibular, preauricular or posterior auricular adenopathy.     Cervical: No cervical adenopathy.     Right cervical: No superficial or deep cervical adenopathy.    Left cervical: No superficial or deep cervical adenopathy.  Skin:    General: Skin is  warm and dry.     Coloration: Skin is not pale.     Findings: No abrasion, bruising, ecchymosis, erythema, lesion or rash.     Nails: There is no clubbing.  Neurological:     General: No focal deficit present.     Mental Status: He is alert and oriented  to person, place, and time.     Sensory: No sensory deficit.     Coordination: Coordination normal.     Gait: Gait normal.  Psychiatric:        Attention and Perception: He is attentive.        Mood and Affect: Mood normal.        Speech: Speech normal.        Behavior: Behavior normal. Behavior is cooperative.        Thought Content: Thought content normal.        Judgment: Judgment normal.          Assessment & Plan:   HIV disease:  I have reviewed his viral load listed below Lab Results  Component Value Date   HIV1RNAQUANT Not Detected 03/19/2021   I have reviewed his CD4 count from July 29th as well which is 843  Hypertension :Much better control see todays vital signs reviewed in including BP and pulse  He has committed to lifestyle changes and has cut out much of his salt  I will continue to prescribe his amlodipine and hydrochlorothiazide  Vitals:   03/23/21 1557  BP: 124/75  Pulse: 67  Temp: 98 F (36.7 C)   Atypical chest pain: has responded to PPI making this likely due to GERD  GERD: I will continue to rx his omeprazole  Vaccine counseling: I was able to convince him to receive first [employer]

## 2021-03-23 NOTE — Progress Notes (Signed)
   Covid-19 Vaccination Clinic  Name:  Cody Barton    MRN: [id number] DOB: [birth year]  03/23/2021  [name] was observed post Covid-19 immunization for 15 minutes without incident. He was provided with Vaccine Information Sheet and instruction to access the V-Safe system.   [name] was instructed to call 911 with any severe reactions post vaccine: Difficulty breathing  Swelling of face and throat  A fast heartbeat  A bad rash all over body  Dizziness and weakness   Immunizations Administered     Name Date Dose VIS Date Route   PFIZER Comrnaty(Gray TOP) Covid-19 Vaccine 03/23/2021  4:48 PM 0.3 mL 07/30/2020 Intramuscular   Manufacturer: [employer], [employer]   Lot: [id number]   NDC: [address]-3      Yanice Maqueda T Pricilla Loveless

## 2021-03-24 ENCOUNTER — Ambulatory Visit: Payer: Self-pay

## 2021-04-12 ENCOUNTER — Encounter: Payer: Self-pay | Admitting: Infectious Disease

## 2021-04-15 ENCOUNTER — Ambulatory Visit: Payer: Self-pay

## 2021-04-21 ENCOUNTER — Other Ambulatory Visit: Payer: Self-pay

## 2021-04-21 ENCOUNTER — Ambulatory Visit: Payer: Self-pay

## 2021-05-07 ENCOUNTER — Ambulatory Visit: Payer: Self-pay

## 2021-06-28 ENCOUNTER — Ambulatory Visit: Payer: Self-pay | Admitting: Infectious Disease

## 2021-07-25 ENCOUNTER — Other Ambulatory Visit: Payer: Self-pay

## 2021-07-25 ENCOUNTER — Emergency Department (HOSPITAL_COMMUNITY)
Admission: EM | Admit: 2021-07-25 | Discharge: 2021-07-26 | Disposition: A | Discharge status: Home / Self Care | Payer: Self-pay | Attending: Emergency Medicine | Admitting: Emergency Medicine

## 2021-07-25 DIAGNOSIS — Z20822 Contact with and (suspected) exposure to covid-19: Secondary | ICD-10-CM | POA: Insufficient documentation

## 2021-07-25 DIAGNOSIS — Z87891 Personal history of nicotine dependence: Secondary | ICD-10-CM | POA: Insufficient documentation

## 2021-07-25 DIAGNOSIS — B950 Streptococcus, group A, as the cause of diseases classified elsewhere: Secondary | ICD-10-CM | POA: Insufficient documentation

## 2021-07-25 DIAGNOSIS — I10 Essential (primary) hypertension: Secondary | ICD-10-CM | POA: Insufficient documentation

## 2021-07-25 DIAGNOSIS — J020 Streptococcal pharyngitis: Secondary | ICD-10-CM

## 2021-07-25 DIAGNOSIS — Z79899 Other long term (current) drug therapy: Secondary | ICD-10-CM | POA: Insufficient documentation

## 2021-07-25 DIAGNOSIS — Z8616 Personal history of COVID-19: Secondary | ICD-10-CM | POA: Insufficient documentation

## 2021-07-25 DIAGNOSIS — J36 Peritonsillar abscess: Secondary | ICD-10-CM | POA: Insufficient documentation

## 2021-07-25 DIAGNOSIS — Z21 Asymptomatic human immunodeficiency virus [HIV] infection status: Secondary | ICD-10-CM | POA: Insufficient documentation

## 2021-07-25 LAB — RESP PANEL BY RT-PCR (FLU A&B, COVID) ARPGX2
Influenza A by PCR: NEGATIVE
Influenza B by PCR: NEGATIVE
SARS Coronavirus 2 by RT PCR: NEGATIVE

## 2021-07-25 LAB — GROUP A STREP BY PCR: Group A Strep by PCR: DETECTED — AB

## 2021-07-25 NOTE — ED Provider Notes (Signed)
Emergency Medicine Provider Triage Evaluation Note  Cody Barton , a 45 y.o. male  was evaluated in triage.  Patient presents the emergency room with sore throat for 4 days.  He has had associated chills.  No objective fevers at home.  He does use ibuprofen at home but does not work very well.  He has been seen for this in the past and states he uses every single year.  Review of Systems  Positive: Sore throat, chills Negative:   Physical Exam  BP (!) 151/99 (BP Location: Left Arm)   Pulse (!) 103   Temp 99.4 F (37.4 C)   Resp (!) 22   SpO2 99%  Gen:   Awake, no distress   Resp:  Normal effort  MSK:   Moves extremities without difficulty  Other:  Posterior pharynx with erythema and bilateral tonsillar swelling about 3+.  Exudates present.  No visible peritonsillar abscess.  Uvula is midline.   Medical Decision Making  Medically screening exam initiated at 8:02 PM.  Appropriate orders placed.  Cody Barton was informed that the remainder of the evaluation will be completed by another provider, this initial triage assessment does not replace that evaluation, and the importance of remaining in the ED until their evaluation is complete.     Therese Sarah 07/25/21 Renelda Loma, MD 07/25/21 2029

## 2021-07-25 NOTE — ED Triage Notes (Signed)
Pt presents from home with sore throat x 4 days.  States ibuprofen helps temporarily but does not last.  States he feels "sweaty" at night and "gets this every year"

## 2021-07-26 ENCOUNTER — Other Ambulatory Visit: Payer: Self-pay

## 2021-07-26 ENCOUNTER — Encounter (HOSPITAL_COMMUNITY): Payer: Self-pay | Admitting: Emergency Medicine

## 2021-07-26 LAB — BASIC METABOLIC PANEL
Anion gap: 7 (ref 5–15)
BUN: 12 mg/dL (ref 6–20)
CO2: 27 mmol/L (ref 22–32)
Calcium: 9.5 mg/dL (ref 8.9–10.3)
Chloride: 101 mmol/L (ref 98–111)
Creatinine, Ser: 0.97 mg/dL (ref 0.61–1.24)
GFR, Estimated: >60 mL/min (ref >60)
Glucose, Bld: 114 mg/dL — ABNORMAL HIGH (ref 70–99)
Potassium: 4.7 mmol/L (ref 3.5–5.1)
Sodium: 135 mmol/L (ref 135–145)

## 2021-07-26 LAB — CBC WITH DIFFERENTIAL/PLATELET
Abs Immature Granulocytes: 0.07 10*3/uL (ref 0.00–0.07)
Basophils Absolute: 0.1 10*3/uL (ref 0.0–0.1)
Basophils Relative: 0 %
Eosinophils Absolute: 0 10*3/uL (ref 0.0–0.5)
Eosinophils Relative: 0 %
HCT: 47.3 % (ref 39.0–52.0)
Hemoglobin: 15.1 g/dL (ref 13.0–17.0)
Immature Granulocytes: 0 %
Lymphocytes Relative: 11 %
Lymphs Abs: 1.9 10*3/uL (ref 0.7–4.0)
MCH: 31.1 pg (ref 26.0–34.0)
MCHC: 31.9 g/dL (ref 30.0–36.0)
MCV: 97.5 fL (ref 80.0–100.0)
Monocytes Absolute: 1.6 10*3/uL — ABNORMAL HIGH (ref 0.1–1.0)
Monocytes Relative: 10 %
Neutro Abs: 12.8 10*3/uL — ABNORMAL HIGH (ref 1.7–7.7)
Neutrophils Relative %: 79 %
Platelets: 301 10*3/uL (ref 150–400)
RBC: 4.85 MIL/uL (ref 4.22–5.81)
RDW: 11.9 % (ref 11.5–15.5)
WBC: 16.4 10*3/uL — ABNORMAL HIGH (ref 4.0–10.5)
nRBC: 0 % (ref 0.0–0.2)

## 2021-07-26 MED ORDER — CLINDAMYCIN HCL 300 MG PO CAPS: 300.0000 mg | ORAL_CAPSULE | Freq: Three times a day (TID) | ORAL | 0 refills | Status: AC

## 2021-07-26 MED ORDER — HYDROMORPHONE HCL 1 MG/ML IJ SOLN
1.0000 mg | Freq: Once | INTRAMUSCULAR | Status: AC
  Administered 2021-07-26: 1 mg via INTRAVENOUS
  Filled 2021-07-26: qty 1

## 2021-07-26 MED ORDER — OXYCODONE-ACETAMINOPHEN 5-325 MG PO TABS
1.0000 | ORAL_TABLET | Freq: Three times a day (TID) | ORAL | 0 refills | Status: DC | PRN
Start: 2021-07-26 — End: 2021-07-30

## 2021-07-26 MED ORDER — DEXAMETHASONE SODIUM PHOSPHATE 10 MG/ML IJ SOLN
10.0000 mg | Freq: Once | INTRAMUSCULAR | Status: AC
  Administered 2021-07-26: 10 mg via INTRAVENOUS
  Filled 2021-07-26: qty 1

## 2021-07-26 MED ORDER — CLINDAMYCIN PHOSPHATE 900 MG/50ML IV SOLN
900.0000 mg | Freq: Once | INTRAVENOUS | Status: AC
  Administered 2021-07-26: 900 mg via INTRAVENOUS
  Filled 2021-07-26: qty 50

## 2021-07-26 MED ORDER — SODIUM CHLORIDE 0.9 % IV BOLUS
1000.0000 mL | Freq: Once | INTRAVENOUS | Status: AC
  Administered 2021-07-26: 1000 mL via INTRAVENOUS

## 2021-07-26 NOTE — Consult Note (Signed)
Reason for Consult: Sore throat Referring Physician: Benjiman Core, MD  Cody Barton is an 45 y.o. male.  HPI: 4-day history of progressively worsening sore throat, localizing to the left.  Strep test positive last night.  He has had multiple peritonsillar abscess in the past.  He is HIV positive but his titers are nondetectable.  He does not use tobacco products.  Past Medical History:  Diagnosis Date   Atypical chest pain 04/08/2019   Epididymoorchitis 01/14/2020   Exposure to COVID-19 virus 09/09/2019   GERD (gastroesophageal reflux disease) 01/07/2019   Gonorrhea    HIV (human immunodeficiency virus infection) (HCC)    Hypertension    Major depressive disorder, single episode 04/19/2016   Otitis media 12/16/2014   Seasonal allergies 12/16/2014   Sore throat 12/16/2014   Vaccine counseling 08/31/2020    No past surgical history on file.  Family History  Problem Relation Age of Onset   Hypertension Father    Cancer Maternal Grandfather     Social History:  reports that he quit smoking about 3 years ago. His smoking use included cigars and cigarettes. He has a 0.45 pack-year smoking history. He has never used smokeless tobacco. He reports current alcohol use. He reports current drug use. Frequency: 3.00 times per week. Drug: Marijuana.  Allergies: No Known Allergies  Medications: Reviewed  Results for orders placed or performed during the hospital encounter of 07/25/21 (from the past 48 hour(s))  Group A Strep by PCR     Status: Abnormal   Collection Time: 07/25/21  8:00 PM   Specimen: Throat; Sterile Swab  Result Value Ref Range   Group A Strep by PCR DETECTED (A) NOT DETECTED    Comment: Performed at [facility], [address], [place], [place] [address]  Resp Panel by RT-PCR (Flu A&B, Covid) Nasopharyngeal Swab     Status: None   Collection Time: 07/25/21  8:01 PM   Specimen: Nasopharyngeal Swab; Nasopharyngeal(NP) swabs in vial transport medium  Result  Value Ref Range   SARS Coronavirus 2 by RT PCR NEGATIVE NEGATIVE    Comment: (NOTE) SARS-CoV-2 target nucleic acids are NOT DETECTED.  The SARS-CoV-2 RNA is generally detectable in upper respiratory specimens during the acute phase of infection. The lowest concentration of SARS-CoV-2 viral copies this assay can detect is 138 copies/mL. A negative result does not preclude SARS-Cov-2 infection and should not be used as the sole basis for treatment or other patient management decisions. A negative result may occur with  improper specimen collection/handling, submission of specimen other than nasopharyngeal swab, presence of viral mutation(s) within the areas targeted by this assay, and inadequate number of viral copies(<138 copies/mL). A negative result must be combined with clinical observations, patient history, and epidemiological information. The expected result is Negative.  Fact Sheet for Patients:  BloggerCourse.com  Fact Sheet for Healthcare Providers:  SeriousBroker.it  This test is no t yet approved or cleared by the [place] FDA and  has been authorized for detection and/or diagnosis of SARS-CoV-2 by FDA under an Emergency Use Authorization (EUA). This EUA will remain  in effect (meaning this test can be used) for the duration of the COVID-19 declaration under Section 564(b)(1) of the Act, 21 U.S.C.section 360bbb-3(b)(1), unless the authorization is terminated  or revoked sooner.       Influenza A by PCR NEGATIVE NEGATIVE   Influenza B by PCR NEGATIVE NEGATIVE    Comment: (NOTE) The Xpert Xpress SARS-CoV-2/FLU/RSV plus assay is intended as an  aid in the diagnosis of influenza from Nasopharyngeal swab specimens and should not be used as a sole basis for treatment. Nasal washings and aspirates are unacceptable for Xpert Xpress SARS-CoV-2/FLU/RSV testing.  Fact Sheet for  Patients: BloggerCourse.com  Fact Sheet for Healthcare Providers: SeriousBroker.it  This test is not yet approved or cleared by the [place] FDA and has been authorized for detection and/or diagnosis of SARS-CoV-2 by FDA under an Emergency Use Authorization (EUA). This EUA will remain in effect (meaning this test can be used) for the duration of the COVID-19 declaration under Section 564(b)(1) of the Act, 21 U.S.C. section 360bbb-3(b)(1), unless the authorization is terminated or revoked.  Performed at [facility], [address] [place], [place] [address]   CBC with Differential     Status: Abnormal   Collection Time: 07/26/21  9:33 AM  Result Value Ref Range   WBC 16.4 (H) 4.0 - 10.5 K/uL   RBC 4.85 4.22 - 5.81 MIL/uL   Hemoglobin 15.1 13.0 - 17.0 g/dL   HCT 64.4 03.4 - 74.2 %   MCV 97.5 80.0 - 100.0 fL   MCH 31.1 26.0 - 34.0 pg   MCHC 31.9 30.0 - 36.0 g/dL   RDW 59.5 63.8 - 75.6 %   Platelets 301 150 - 400 K/uL   nRBC 0.0 0.0 - 0.2 %   Neutrophils Relative % 79 %   Neutro Abs 12.8 (H) 1.7 - 7.7 K/uL   Lymphocytes Relative 11 %   Lymphs Abs 1.9 0.7 - 4.0 K/uL   Monocytes Relative 10 %   Monocytes Absolute 1.6 (H) 0.1 - 1.0 K/uL   Eosinophils Relative 0 %   Eosinophils Absolute 0.0 0.0 - 0.5 K/uL   Basophils Relative 0 %   Basophils Absolute 0.1 0.0 - 0.1 K/uL   Immature Granulocytes 0 %   Abs Immature Granulocytes 0.07 0.00 - 0.07 K/uL    Comment: Performed at [facility], [address] [place], [place] [address]  Basic metabolic panel     Status: Abnormal   Collection Time: 07/26/21  9:33 AM  Result Value Ref Range   Sodium 135 135 - 145 mmol/L   Potassium 4.7 3.5 - 5.1 mmol/L   Chloride 101 98 - 111 mmol/L   CO2 27 22 - 32 mmol/L   Glucose, Bld 114 (H) 70 - 99 mg/dL    Comment: Glucose reference range applies only to samples taken after fasting for at least 8 hours.   BUN 12 6 - 20 mg/dL    Creatinine, Ser 5.18 0.61 - 1.24 mg/dL   Calcium 9.5 8.9 - 84.1 mg/dL   GFR, Estimated >66 >06 mL/min    Comment: (NOTE) Calculated using the CKD-EPI Creatinine Equation (2021)    Anion gap 7 5 - 15    Comment: Performed at [facility], [address] [place], [place] [address]    No results found.  FUX:[id number] except as listed in admit H&P  Blood pressure (!) 154/88, pulse 93, temperature 99 F (37.2 C), temperature source Oral, resp. rate 18, SpO2 98 %.  PHYSICAL EXAM: Overall appearance:  Healthy appearing, in no distress, no difficulty breathing but he does have a "hot potato" voice. Head:  Normocephalic, atraumatic. Ears: External auditory canals are clear; tympanic membranes are intact in the middle ears are free of any effusion. Nose: External nose is healthy in appearance. Internal nasal exam free of any lesions or obstruction. Oral Cavity/Pharynx:  There are  no mucosal lesions or masses identified.  There is very mild trismus.  The left tonsil is medially displaced with swelling of the soft palate. Larynx/Hypopharynx: Deferred Neuro:  No identifiable neurologic deficits. Neck: Slightly tender left level 2 adenopathy.  Studies Reviewed: none  Procedures: none   Assessment/Plan: Left peritonsillar infection, cellulitis versus abscess.  Options were discussed including acute tonsillectomy, aspiration and I&D of PTA, outpatient treatment with antibiotics.  He feels that he is able to drink adequate amounts of liquid.  He does not want to pursue any type of surgery at this point.  We will give him 1 dose of IV clindamycin and then he can go home on oral clindamycin.  He can follow-up with [place] in the office if he does not start improving in a couple of days.  And then we can discuss surgical intervention if necessary.  In the long run he definitely should have his tonsils removed since he had recurrent abscess.  He understands and agrees.  B20 J36   Serena Colonel 07/26/2021, 10:40 AM

## 2021-07-26 NOTE — ED Provider Notes (Signed)
[facility] EMERGENCY DEPARTMENT Provider Note   CSN: [id number] Arrival date & time: 07/25/21  1909     History Chief Complaint  Patient presents with   Sore Throat    Cody Barton is a 45 y.o. male.   Sore Throat Pertinent negatives include no chest pain, no abdominal pain and no shortness of breath. Patient with sore throat.  Had for the last few days.  Worse with swallowing.  Some difficulty swallowing but still able to handle his secretions.  No fevers or chills.  Worse on left side and does go to the ear.  Positive strep test.  Have HIV but normal CD4 count.     Past Medical History:  Diagnosis Date   Atypical chest pain 04/08/2019   Epididymoorchitis 01/14/2020   Exposure to COVID-19 virus 09/09/2019   GERD (gastroesophageal reflux disease) 01/07/2019   Gonorrhea    HIV (human immunodeficiency virus infection) (HCC)    Hypertension    Major depressive disorder, single episode 04/19/2016   Otitis media 12/16/2014   Seasonal allergies 12/16/2014   Sore throat 12/16/2014   Vaccine counseling 08/31/2020    Patient Active Problem List   Diagnosis Date Noted   Vaccine counseling 08/31/2020   Epididymoorchitis 01/14/2020   Exposure to COVID-19 virus 09/09/2019   Atypical chest pain 04/08/2019   GERD (gastroesophageal reflux disease) 01/07/2019   Healthcare maintenance 06/12/2018   Elevated serum creatinine 06/12/2018   Major depressive disorder, single episode 04/19/2016   Otitis media 12/16/2014   Seasonal allergies 12/16/2014   HIV disease (HCC) 10/10/2012   HTN (hypertension) 10/10/2012    No past surgical history on file.     Family History  Problem Relation Age of Onset   Hypertension Father    Cancer Maternal Grandfather     Social History   Tobacco Use   Smoking status: Former    Packs/day: 0.30    Years: 1.50    Pack years: 0.45    Types: Cigars, Cigarettes    Quit date: 08/21/2017    Years since quitting: 3.9    Smokeless tobacco: Never  Vaping Use   Vaping Use: Never used  Substance Use Topics   Alcohol use: Yes    Alcohol/week: 0.0 standard drinks    Comment:    weekend   Drug use: Yes    Frequency: 3.0 times per week    Types: Marijuana    Comment: daily use     Home Medications Prior to Admission medications   Medication Sig Start Date End Date Taking? Authorizing Provider  clindamycin (CLEOCIN) 300 MG capsule Take 1 capsule (300 mg total) by mouth 3 (three) times daily for 10 days. 07/26/21 08/05/21 Yes Serena Colonel, MD  oxyCODONE-acetaminophen (PERCOCET/ROXICET) 5-325 MG tablet Take 1-2 tablets by mouth every 8 (eight) hours as needed for severe pain. 07/26/21  Yes Benjiman Core, MD  amLODipine (NORVASC) 10 MG tablet Take 1 tablet (10 mg total) by mouth daily. 03/23/21   Randall Hiss, MD  Dolutegravir-lamiVUDine (DOVATO) 50-300 MG TABS Take 1 tablet by mouth daily. 03/23/21   Randall Hiss, MD  hydrochlorothiazide (HYDRODIURIL) 25 MG tablet TAKE 1 TABLET(25 MG) BY MOUTH DAILY 03/23/21   Daiva Eves, Lisette Grinder, MD  lidocaine (LIDODERM) 5 % Place 1 patch onto the skin daily. Remove & Discard patch within 12 hours or as directed by MD 06/30/19   Henderly, Britni A, PA-C  methocarbamol (ROBAXIN) 500 MG tablet Take 1 tablet (500 mg total)  by mouth 2 (two) times daily. Patient not taking: Reported on 03/23/2021 06/30/19   Henderly, Britni A, PA-C  multivitamin-iron-minerals-folic acid (CENTRUM) chewable tablet Chew 1 tablet by mouth daily.    [provider]  naproxen (NAPROSYN) 500 MG tablet Take 1 tablet (500 mg total) by mouth 2 (two) times daily. 11/29/19   Wallis Bamberg, PA-C  Omega-3 Fatty Acids (FISH OIL) 1200 MG CPDR Take by mouth.    [provider]  omeprazole (PRILOSEC) 20 MG capsule Take 1 capsule (20 mg total) by mouth daily. 12/22/17   Eustace Moore, MD  omeprazole (PRILOSEC) 20 MG capsule Take 1 capsule (20 mg total) by mouth daily. Patient not taking:  Reported on 03/23/2021 08/31/20   Daiva Eves, Lisette Grinder, MD    Allergies    Patient has no known allergies.  Review of Systems   Review of Systems  Constitutional:  Positive for appetite change and fatigue. Negative for fever.  HENT:  Positive for ear pain and sore throat.   Respiratory:  Negative for shortness of breath.   Cardiovascular:  Negative for chest pain.  Gastrointestinal:  Negative for abdominal pain.  Genitourinary:  Negative for flank pain.  Musculoskeletal:  Negative for back pain.  Skin:  Negative for rash.  Neurological:  Negative for weakness.   Physical Exam Updated Vital Signs BP (!) 133/96 (BP Location: Right Arm)   Pulse 96   Temp 98.9 F (37.2 C) (Oral)   Resp 20   SpO2 100%   Physical Exam Vitals and nursing note reviewed.  HENT:     Head: Normocephalic.     Nose: No congestion.     Mouth/Throat:     Mouth: Mucous membranes are moist.     Tonsils: No tonsillar exudate.     Comments: Tonsillar swelling bilaterally.  Peritonsillar edema bilaterally but worse on the left with more swelling. Cardiovascular:     Rate and Rhythm: Normal rate.  Pulmonary:     Breath sounds: No wheezing or rhonchi.  Abdominal:     Tenderness: There is no abdominal tenderness.  Musculoskeletal:     Cervical back: Neck supple.  Neurological:     Mental Status: He is alert.    ED Results / Procedures / Treatments   Labs (all labs ordered are listed, but only abnormal results are displayed) Labs Reviewed  GROUP A STREP BY PCR - Abnormal; Notable for the following components:      Result Value   Group A Strep by PCR DETECTED (*)    All other components within normal limits  CBC WITH DIFFERENTIAL/PLATELET - Abnormal; Notable for the following components:   WBC 16.4 (*)    Neutro Abs 12.8 (*)    Monocytes Absolute 1.6 (*)    All other components within normal limits  BASIC METABOLIC PANEL - Abnormal; Notable for the following components:   Glucose, Bld 114 (*)     All other components within normal limits  RESP PANEL BY RT-PCR (FLU A&B, COVID) ARPGX2    EKG None  Radiology No results found.  Procedures Procedures   Medications Ordered in ED Medications  sodium chloride 0.9 % bolus 1,000 mL (0 mLs Intravenous Stopped 07/26/21 1048)  dexamethasone (DECADRON) injection 10 mg (10 mg Intravenous Given 07/26/21 0943)  HYDROmorphone (DILAUDID) injection 1 mg (1 mg Intravenous Given 07/26/21 0943)  clindamycin (CLEOCIN) IVPB 900 mg (0 mg Intravenous Stopped 07/26/21 1317)  HYDROmorphone (DILAUDID) injection 1 mg (1 mg Intravenous Given 07/26/21 1124)  ED Course  I have reviewed the triage vital signs and the nursing notes.  Pertinent labs & imaging results that were available during my care of the patient were reviewed by me and considered in my medical decision making (see chart for details).  Clinical Course as of 07/27/21 1225  Sun Jul 25, 2021  2155 Group A Strep by PCR(!): DETECTED [GL]    Clinical Course User Index [GL] Loeffler, Finis Bud, PA-C   MDM Rules/Calculators/A&P                           Patient presents with a sore throat.  Had for the last few days.  Strep positive.  Found to have cellulitis versus peritonsillar abscess.  Treated with steroids fluids antibiotics and pain medicine.  Feels better.  Seen in ER by Dr. Pollyann Kennedy.  Offered drainage and possible tonsillectomy now versus delayed.  Feeling better and will follow-up as an outpatient.  Discharge home with antibiotics and pain medicine. Final Clinical Impression(s) / ED Diagnoses Final diagnoses:  Strep pharyngitis  Peritonsillar abscess    Rx / DC Orders ED Discharge Orders          Ordered    clindamycin (CLEOCIN) 300 MG capsule  3 times daily        07/26/21 1043    oxyCODONE-acetaminophen (PERCOCET/ROXICET) 5-325 MG tablet  Every 8 hours PRN        07/26/21 1310             Benjiman Core, MD 07/27/21 1225

## 2021-07-26 NOTE — Discharge Instructions (Addendum)
Take 600 mg ibuprofen every 6 hours as needed Take 1000 mg of Tylenol every 6 hours as needed.  Gargle with warm salt water as needed.  You can also take the oxycodone.  Do not take it if you have taken Tylenol within the last 6 hours

## 2021-07-29 ENCOUNTER — Emergency Department (HOSPITAL_COMMUNITY): Payer: Self-pay | Admitting: Anesthesiology

## 2021-07-29 ENCOUNTER — Encounter (HOSPITAL_COMMUNITY)
Admission: EM | Disposition: A | Discharge status: Home / Self Care | Payer: Self-pay | Source: Home / Self Care | Attending: Emergency Medicine

## 2021-07-29 ENCOUNTER — Emergency Department (HOSPITAL_COMMUNITY): Payer: Self-pay

## 2021-07-29 ENCOUNTER — Encounter (HOSPITAL_COMMUNITY): Payer: Self-pay | Admitting: Radiology

## 2021-07-29 ENCOUNTER — Other Ambulatory Visit: Payer: Self-pay

## 2021-07-29 ENCOUNTER — Observation Stay (HOSPITAL_COMMUNITY)
Admission: EM | Admit: 2021-07-29 | Discharge: 2021-07-30 | Disposition: A | Discharge status: Home / Self Care | Payer: Self-pay | Attending: Otolaryngology | Admitting: Otolaryngology

## 2021-07-29 DIAGNOSIS — J36 Peritonsillar abscess: Principal | ICD-10-CM | POA: Insufficient documentation

## 2021-07-29 DIAGNOSIS — Z87891 Personal history of nicotine dependence: Secondary | ICD-10-CM | POA: Insufficient documentation

## 2021-07-29 DIAGNOSIS — I10 Essential (primary) hypertension: Secondary | ICD-10-CM | POA: Insufficient documentation

## 2021-07-29 DIAGNOSIS — K118 Other diseases of salivary glands: Secondary | ICD-10-CM

## 2021-07-29 DIAGNOSIS — Z20822 Contact with and (suspected) exposure to covid-19: Secondary | ICD-10-CM | POA: Insufficient documentation

## 2021-07-29 DIAGNOSIS — B20 Human immunodeficiency virus [HIV] disease: Secondary | ICD-10-CM | POA: Insufficient documentation

## 2021-07-29 DIAGNOSIS — Z9089 Acquired absence of other organs: Secondary | ICD-10-CM

## 2021-07-29 HISTORY — PX: TONSILLECTOMY: SHX5217

## 2021-07-29 LAB — COMPREHENSIVE METABOLIC PANEL
ALT: 19 U/L (ref 0–44)
AST: 15 U/L (ref 15–41)
Albumin: 3.6 g/dL (ref 3.5–5.0)
Alkaline Phosphatase: 76 U/L (ref 38–126)
Anion gap: 11 (ref 5–15)
BUN: 13 mg/dL (ref 6–20)
CO2: 29 mmol/L (ref 22–32)
Calcium: 10 mg/dL (ref 8.9–10.3)
Chloride: 98 mmol/L (ref 98–111)
Creatinine, Ser: 1.1 mg/dL (ref 0.61–1.24)
GFR, Estimated: >60 mL/min (ref >60)
Glucose, Bld: 135 mg/dL — ABNORMAL HIGH (ref 70–99)
Potassium: 3.8 mmol/L (ref 3.5–5.1)
Sodium: 138 mmol/L (ref 135–145)
Total Bilirubin: 1.7 mg/dL — ABNORMAL HIGH (ref 0.3–1.2)
Total Protein: 9.1 g/dL — ABNORMAL HIGH (ref 6.5–8.1)

## 2021-07-29 LAB — CBC WITH DIFFERENTIAL/PLATELET
Abs Immature Granulocytes: 0.08 10*3/uL — ABNORMAL HIGH (ref 0.00–0.07)
Basophils Absolute: 0.1 10*3/uL (ref 0.0–0.1)
Basophils Relative: 0 %
Eosinophils Absolute: 0 10*3/uL (ref 0.0–0.5)
Eosinophils Relative: 0 %
HCT: 47.9 % (ref 39.0–52.0)
Hemoglobin: 15.6 g/dL (ref 13.0–17.0)
Immature Granulocytes: 0 %
Lymphocytes Relative: 14 %
Lymphs Abs: 2.7 10*3/uL (ref 0.7–4.0)
MCH: 31.3 pg (ref 26.0–34.0)
MCHC: 32.6 g/dL (ref 30.0–36.0)
MCV: 96 fL (ref 80.0–100.0)
Monocytes Absolute: 2.5 10*3/uL — ABNORMAL HIGH (ref 0.1–1.0)
Monocytes Relative: 13 %
Neutro Abs: 14.5 10*3/uL — ABNORMAL HIGH (ref 1.7–7.7)
Neutrophils Relative %: 73 %
Platelets: 428 10*3/uL — ABNORMAL HIGH (ref 150–400)
RBC: 4.99 MIL/uL (ref 4.22–5.81)
RDW: 11.8 % (ref 11.5–15.5)
WBC: 20 10*3/uL — ABNORMAL HIGH (ref 4.0–10.5)
nRBC: 0 % (ref 0.0–0.2)

## 2021-07-29 LAB — RESP PANEL BY RT-PCR (FLU A&B, COVID) ARPGX2
Influenza A by PCR: NEGATIVE
Influenza B by PCR: NEGATIVE
SARS Coronavirus 2 by RT PCR: NEGATIVE

## 2021-07-29 LAB — GROUP A STREP BY PCR: Group A Strep by PCR: DETECTED — AB

## 2021-07-29 SURGERY — TONSILLECTOMY
Anesthesia: General | Laterality: Bilateral

## 2021-07-29 MED ORDER — LIDOCAINE 2% (20 MG/ML) 5 ML SYRINGE
INTRAMUSCULAR | Status: DC | PRN
  Administered 2021-07-29: 60 mg via INTRAVENOUS

## 2021-07-29 MED ORDER — HYDROCODONE-ACETAMINOPHEN 7.5-325 MG/15ML PO SOLN
10.0000 mL | ORAL | Status: DC | PRN
  Filled 2021-07-29: qty 15

## 2021-07-29 MED ORDER — MIDAZOLAM HCL 2 MG/2ML IJ SOLN
INTRAMUSCULAR | Status: AC
  Filled 2021-07-29: qty 2

## 2021-07-29 MED ORDER — SUGAMMADEX SODIUM 200 MG/2ML IV SOLN
INTRAVENOUS | Status: DC | PRN
  Administered 2021-07-29: 200 mg via INTRAVENOUS

## 2021-07-29 MED ORDER — ACETAMINOPHEN 10 MG/ML IV SOLN
INTRAVENOUS | Status: DC | PRN
  Administered 2021-07-29: 1000 mg via INTRAVENOUS

## 2021-07-29 MED ORDER — ACETAMINOPHEN 10 MG/ML IV SOLN
INTRAVENOUS | Status: AC
  Filled 2021-07-29: qty 100

## 2021-07-29 MED ORDER — CLINDAMYCIN PHOSPHATE 600 MG/50ML IV SOLN: 600.0000 mg | Freq: Three times a day (TID) | INTRAVENOUS | Status: DC

## 2021-07-29 MED ORDER — MIDAZOLAM HCL 2 MG/2ML IJ SOLN
INTRAMUSCULAR | Status: DC | PRN
  Administered 2021-07-29: 2 mg via INTRAVENOUS

## 2021-07-29 MED ORDER — FENTANYL CITRATE (PF) 100 MCG/2ML IJ SOLN: 25.0000 ug | INTRAMUSCULAR | Status: DC | PRN

## 2021-07-29 MED ORDER — LAMIVUDINE 150 MG PO TABS
300.0000 mg | ORAL_TABLET | Freq: Every day | ORAL | Status: DC
  Administered 2021-07-30: 300 mg via ORAL
  Filled 2021-07-29 (×2): qty 2

## 2021-07-29 MED ORDER — SUCCINYLCHOLINE CHLORIDE 200 MG/10ML IV SOSY
PREFILLED_SYRINGE | INTRAVENOUS | Status: AC
  Filled 2021-07-29: qty 10

## 2021-07-29 MED ORDER — METHOCARBAMOL 500 MG PO TABS
500.0000 mg | ORAL_TABLET | Freq: Two times a day (BID) | ORAL | Status: DC
  Administered 2021-07-29 – 2021-07-30 (×2): 500 mg via ORAL
  Filled 2021-07-29 (×2): qty 1

## 2021-07-29 MED ORDER — CHLORHEXIDINE GLUCONATE 0.12 % MT SOLN
OROMUCOSAL | Status: AC
  Filled 2021-07-29: qty 15

## 2021-07-29 MED ORDER — ONDANSETRON HCL 4 MG PO TABS: 4.0000 mg | ORAL_TABLET | ORAL | Status: DC | PRN

## 2021-07-29 MED ORDER — DOLUTEGRAVIR SODIUM 50 MG PO TABS
50.0000 mg | ORAL_TABLET | Freq: Every day | ORAL | Status: DC
  Administered 2021-07-30: 50 mg via ORAL
  Filled 2021-07-29 (×2): qty 1

## 2021-07-29 MED ORDER — PROPOFOL 10 MG/ML IV BOLUS
INTRAVENOUS | Status: DC | PRN
  Administered 2021-07-29: 150 mg via INTRAVENOUS

## 2021-07-29 MED ORDER — 0.9 % SODIUM CHLORIDE (POUR BTL) OPTIME
TOPICAL | Status: DC | PRN
  Administered 2021-07-29: 1000 mL

## 2021-07-29 MED ORDER — AMLODIPINE BESYLATE 10 MG PO TABS
10.0000 mg | ORAL_TABLET | Freq: Every day | ORAL | Status: DC
  Administered 2021-07-30: 10 mg via ORAL
  Filled 2021-07-29: qty 1

## 2021-07-29 MED ORDER — GLYCOPYRROLATE PF 0.2 MG/ML IJ SOSY
PREFILLED_SYRINGE | INTRAMUSCULAR | Status: DC | PRN
  Administered 2021-07-29: .2 mg via INTRAVENOUS

## 2021-07-29 MED ORDER — GLYCOPYRROLATE PF 0.2 MG/ML IJ SOSY
PREFILLED_SYRINGE | INTRAMUSCULAR | Status: AC
  Filled 2021-07-29: qty 1

## 2021-07-29 MED ORDER — PANTOPRAZOLE SODIUM 40 MG PO TBEC
40.0000 mg | DELAYED_RELEASE_TABLET | Freq: Every day | ORAL | Status: DC
  Administered 2021-07-30: 40 mg via ORAL
  Filled 2021-07-29: qty 1

## 2021-07-29 MED ORDER — CHLORHEXIDINE GLUCONATE 0.12 % MT SOLN: 15.0000 mL | Freq: Once | OROMUCOSAL | Status: DC

## 2021-07-29 MED ORDER — ONDANSETRON HCL 4 MG/2ML IJ SOLN
INTRAMUSCULAR | Status: AC
  Filled 2021-07-29: qty 2

## 2021-07-29 MED ORDER — FENTANYL CITRATE (PF) 100 MCG/2ML IJ SOLN
INTRAMUSCULAR | Status: DC | PRN
  Administered 2021-07-29: 100 ug via INTRAVENOUS
  Administered 2021-07-29: 50 ug via INTRAVENOUS

## 2021-07-29 MED ORDER — ORAL CARE MOUTH RINSE: 15.0000 mL | Freq: Once | OROMUCOSAL | Status: DC

## 2021-07-29 MED ORDER — ROCURONIUM BROMIDE 10 MG/ML (PF) SYRINGE
PREFILLED_SYRINGE | INTRAVENOUS | Status: DC | PRN
Start: 2021-07-29 — End: 2021-07-29
  Administered 2021-07-29: 40 mg via INTRAVENOUS

## 2021-07-29 MED ORDER — SUCCINYLCHOLINE CHLORIDE 200 MG/10ML IV SOSY
PREFILLED_SYRINGE | INTRAVENOUS | Status: DC | PRN
Start: 2021-07-29 — End: 2021-07-29
  Administered 2021-07-29: 140 mg via INTRAVENOUS

## 2021-07-29 MED ORDER — IOHEXOL 300 MG/ML  SOLN
75.0000 mL | Freq: Once | INTRAMUSCULAR | Status: AC | PRN
  Administered 2021-07-29: 75 mL via INTRAVENOUS

## 2021-07-29 MED ORDER — CLINDAMYCIN HCL 300 MG PO CAPS: 300.0000 mg | ORAL_CAPSULE | Freq: Three times a day (TID) | ORAL | Status: DC

## 2021-07-29 MED ORDER — LACTATED RINGERS IV SOLN: INTRAVENOUS | Status: DC

## 2021-07-29 MED ORDER — ONDANSETRON HCL 4 MG/2ML IJ SOLN
INTRAMUSCULAR | Status: DC | PRN
  Administered 2021-07-29: 4 mg via INTRAVENOUS

## 2021-07-29 MED ORDER — DOLUTEGRAVIR-LAMIVUDINE 50-300 MG PO TABS: 1.0000 | ORAL_TABLET | Freq: Every day | ORAL | Status: DC

## 2021-07-29 MED ORDER — ONDANSETRON HCL 4 MG/2ML IJ SOLN: 4.0000 mg | INTRAMUSCULAR | Status: DC | PRN

## 2021-07-29 MED ORDER — DEXAMETHASONE SODIUM PHOSPHATE 10 MG/ML IJ SOLN
INTRAMUSCULAR | Status: AC
  Filled 2021-07-29: qty 1

## 2021-07-29 MED ORDER — NAPROXEN 250 MG PO TABS
500.0000 mg | ORAL_TABLET | Freq: Two times a day (BID) | ORAL | Status: DC
  Administered 2021-07-29 – 2021-07-30 (×2): 500 mg via ORAL
  Filled 2021-07-29 (×2): qty 2

## 2021-07-29 MED ORDER — HYDROCHLOROTHIAZIDE 25 MG PO TABS
25.0000 mg | ORAL_TABLET | Freq: Every day | ORAL | Status: DC
  Administered 2021-07-30: 25 mg via ORAL
  Filled 2021-07-29: qty 1

## 2021-07-29 MED ORDER — PROPOFOL 10 MG/ML IV BOLUS
INTRAVENOUS | Status: AC
  Filled 2021-07-29: qty 20

## 2021-07-29 MED ORDER — HYDROCODONE-ACETAMINOPHEN 7.5-325 MG/15ML PO SOLN
10.0000 mL | ORAL | Status: DC | PRN
  Administered 2021-07-29: 15 mL via ORAL

## 2021-07-29 MED ORDER — DEXTROSE-NACL 5-0.9 % IV SOLN: INTRAVENOUS | Status: DC

## 2021-07-29 MED ORDER — ROCURONIUM BROMIDE 10 MG/ML (PF) SYRINGE
PREFILLED_SYRINGE | INTRAVENOUS | Status: AC
  Filled 2021-07-29: qty 10

## 2021-07-29 MED ORDER — FENTANYL CITRATE (PF) 250 MCG/5ML IJ SOLN
INTRAMUSCULAR | Status: AC
  Filled 2021-07-29: qty 5

## 2021-07-29 MED ORDER — DEXAMETHASONE SODIUM PHOSPHATE 10 MG/ML IJ SOLN
INTRAMUSCULAR | Status: DC | PRN
  Administered 2021-07-29: 10 mg via INTRAVENOUS

## 2021-07-29 MED ORDER — CENTRUM PO CHEW
1.0000 | CHEWABLE_TABLET | Freq: Every day | ORAL | Status: DC
  Filled 2021-07-29: qty 1

## 2021-07-29 MED ORDER — CLINDAMYCIN PHOSPHATE 600 MG/50ML IV SOLN
600.0000 mg | Freq: Three times a day (TID) | INTRAVENOUS | Status: DC
  Administered 2021-07-29 – 2021-07-30 (×3): 600 mg via INTRAVENOUS
  Filled 2021-07-29 (×3): qty 50

## 2021-07-29 MED ORDER — PHENOL 1.4 % MT LIQD
1.0000 | OROMUCOSAL | Status: DC | PRN
  Administered 2021-07-29: 1 via OROMUCOSAL
  Filled 2021-07-29: qty 177

## 2021-07-29 MED ORDER — DEXAMETHASONE SODIUM PHOSPHATE 10 MG/ML IJ SOLN
10.0000 mg | Freq: Once | INTRAMUSCULAR | Status: AC
  Administered 2021-07-29: 10 mg via INTRAMUSCULAR
  Filled 2021-07-29: qty 1

## 2021-07-29 SURGICAL SUPPLY — 30 items
BAG COUNTER SPONGE SURGICOUNT (BAG) ×2 IMPLANT
CANISTER SUCT 3000ML PPV (MISCELLANEOUS) ×2 IMPLANT
CATH ROBINSON RED A/P 10FR (CATHETERS) ×2 IMPLANT
CLEANER TIP ELECTROSURG 2X2 (MISCELLANEOUS) ×2 IMPLANT
COAGULATOR SUCT SWTCH 10FR 6 (ELECTROSURGICAL) ×2 IMPLANT
DRAPE HALF SHEET 40X57 (DRAPES) ×2 IMPLANT
ELECT COATED BLADE 2.86 ST (ELECTRODE) ×2 IMPLANT
ELECT REM PT RETURN 9FT ADLT (ELECTROSURGICAL)
ELECT REM PT RETURN 9FT PED (ELECTROSURGICAL)
ELECTRODE REM PT RETRN 9FT PED (ELECTROSURGICAL) IMPLANT
ELECTRODE REM PT RTRN 9FT ADLT (ELECTROSURGICAL) IMPLANT
GAUZE 4X4 16PLY ~~LOC~~+RFID DBL (SPONGE) ×2 IMPLANT
GLOVE SURG LTX SZ7.5 (GLOVE) ×2 IMPLANT
GOWN STRL REUS W/ TWL LRG LVL3 (GOWN DISPOSABLE) ×2 IMPLANT
GOWN STRL REUS W/TWL LRG LVL3 (GOWN DISPOSABLE) ×2
KIT BASIN OR (CUSTOM PROCEDURE TRAY) ×2 IMPLANT
KIT TURNOVER KIT B (KITS) ×2 IMPLANT
NS IRRIG 1000ML POUR BTL (IV SOLUTION) ×2 IMPLANT
PAD ARMBOARD 7.5X6 YLW CONV (MISCELLANEOUS) ×4 IMPLANT
PENCIL FOOT CONTROL (ELECTRODE) ×2 IMPLANT
SPONGE TONSIL TAPE 1 RFD (DISPOSABLE) ×2 IMPLANT
SYR BULB EAR ULCER 3OZ GRN STR (SYRINGE) ×2 IMPLANT
TOWEL GREEN STERILE FF (TOWEL DISPOSABLE) ×4 IMPLANT
TRAY ENT MC OR (CUSTOM PROCEDURE TRAY) ×2 IMPLANT
TUBE CONNECTING 12X1/4 (SUCTIONS) ×2 IMPLANT
TUBE SALEM SUMP 10F W/ARV (TUBING) IMPLANT
TUBE SALEM SUMP 12R W/ARV (TUBING) IMPLANT
TUBE SALEM SUMP 14F W/ARV (TUBING) ×2 IMPLANT
TUBE SALEM SUMP 16 FR W/ARV (TUBING) IMPLANT
WATER STERILE IRR 1000ML POUR (IV SOLUTION) ×2 IMPLANT

## 2021-07-29 NOTE — ED Provider Notes (Signed)
[facility] EMERGENCY DEPARTMENT Provider Note   CSN: [id number] Arrival date & time: 07/29/21  0857     History Chief Complaint  Patient presents with   Otalgia   Sore Throat    Cody Barton is a 45 y.o. male.  Presenting to ER with concern for sore throat, throat swelling.  Symptoms ongoing for the past week or so.  Came to ER 4 days ago, concern for possible PTA, evaluated by ENT, full spectrum of management options seem to be discussed at the time, patient elected conservative management with p.o. antibiotics at home for now and plan to follow-up outpatient ENT.  Patient states initially he may have noticed a little bit of improvement but then things seem to get worse.  Swelling is worse.  Reports that he was able to swallow his antibiotics and has not had any missed doses.  Has had some difficulty speaking due to the swelling.  Denies any difficulty breathing.  Was prescribed clindamycin 300 mg 3 times daily for 10 days.  Patient has past history of HIV.  States that he does have history of prior PTA.  Additional history obtained from chart review, review of last ER visit and Dr. Lucky Rathke consult note.  HPI     Past Medical History:  Diagnosis Date   Atypical chest pain 04/08/2019   Epididymoorchitis 01/14/2020   Exposure to COVID-19 virus 09/09/2019   GERD (gastroesophageal reflux disease) 01/07/2019   Gonorrhea    HIV (human immunodeficiency virus infection) (HCC)    Hypertension    Major depressive disorder, single episode 04/19/2016   Otitis media 12/16/2014   Seasonal allergies 12/16/2014   Sore throat 12/16/2014   Vaccine counseling 08/31/2020    Patient Active Problem List   Diagnosis Date Noted   S/P tonsillectomy 07/29/2021   Vaccine counseling 08/31/2020   Epididymoorchitis 01/14/2020   Exposure to COVID-19 virus 09/09/2019   Atypical chest pain 04/08/2019   GERD (gastroesophageal reflux disease) 01/07/2019   Healthcare  maintenance 06/12/2018   Elevated serum creatinine 06/12/2018   Major depressive disorder, single episode 04/19/2016   Otitis media 12/16/2014   Seasonal allergies 12/16/2014   HIV disease (HCC) 10/10/2012   HTN (hypertension) 10/10/2012    History reviewed. No pertinent surgical history.     Family History  Problem Relation Age of Onset   Hypertension Father    Cancer Maternal Grandfather     Social History   Tobacco Use   Smoking status: Former    Packs/day: 0.30    Years: 1.50    Pack years: 0.45    Types: Cigars, Cigarettes    Quit date: 08/21/2017    Years since quitting: 3.9   Smokeless tobacco: Never  Vaping Use   Vaping Use: Never used  Substance Use Topics   Alcohol use: Yes    Alcohol/week: 0.0 standard drinks    Comment:    weekend   Drug use: Yes    Frequency: 3.0 times per week    Types: Marijuana    Comment: daily use     Home Medications Prior to Admission medications   Medication Sig Start Date End Date Taking? Authorizing Provider  amLODipine (NORVASC) 10 MG tablet Take 1 tablet (10 mg total) by mouth daily. 03/23/21   Randall Hiss, MD  clindamycin (CLEOCIN) 300 MG capsule Take 1 capsule (300 mg total) by mouth 3 (three) times daily for 10 days. 07/26/21 08/05/21  Serena Colonel, MD  Dolutegravir-lamiVUDine (DOVATO) 50-300 MG TABS  Take 1 tablet by mouth daily. 03/23/21   Truman Hayward, MD  hydrochlorothiazide (HYDRODIURIL) 25 MG tablet TAKE 1 TABLET(25 MG) BY MOUTH DAILY 03/23/21   Tommy Medal, Lavell Islam, MD  lidocaine (LIDODERM) 5 % Place 1 patch onto the skin daily. Remove & Discard patch within 12 hours or as directed by MD 06/30/19   Henderly, Britni A, PA-C  methocarbamol (ROBAXIN) 500 MG tablet Take 1 tablet (500 mg total) by mouth 2 (two) times daily. Patient not taking: Reported on 03/23/2021 06/30/19   Henderly, Britni A, PA-C  multivitamin-iron-minerals-folic acid (CENTRUM) chewable tablet Chew 1 tablet by mouth daily.    [provider]  naproxen (NAPROSYN) 500 MG tablet Take 1 tablet (500 mg total) by mouth 2 (two) times daily. 11/29/19   Jaynee Eagles, PA-C  Omega-3 Fatty Acids (FISH OIL) 1200 MG CPDR Take by mouth.    [provider]  omeprazole (PRILOSEC) 20 MG capsule Take 1 capsule (20 mg total) by mouth daily. 12/22/17   Raylene Everts, MD  omeprazole (PRILOSEC) 20 MG capsule Take 1 capsule (20 mg total) by mouth daily. Patient not taking: Reported on 03/23/2021 08/31/20   Tommy Medal, Lavell Islam, MD  oxyCODONE-acetaminophen (PERCOCET/ROXICET) 5-325 MG tablet Take 1-2 tablets by mouth every 8 (eight) hours as needed for severe pain. 07/26/21   Davonna Belling, MD    Allergies    Patient has no known allergies.  Review of Systems   Review of Systems  Constitutional:  Positive for chills and fever.  HENT:  Positive for sore throat and trouble swallowing. Negative for ear pain.   Eyes:  Negative for pain and visual disturbance.  Respiratory:  Negative for cough and shortness of breath.   Cardiovascular:  Negative for chest pain and palpitations.  Gastrointestinal:  Negative for abdominal pain and vomiting.  Genitourinary:  Negative for dysuria and hematuria.  Musculoskeletal:  Negative for arthralgias and back pain.  Skin:  Negative for color change and rash.  Neurological:  Negative for seizures and syncope.  All other systems reviewed and are negative.  Physical Exam Updated Vital Signs BP (!) 146/90 (BP Location: Left Arm)   Pulse 98   Temp 98 F (36.7 C) (Oral)   Resp 18   SpO2 96%   Physical Exam Vitals and nursing note reviewed.  Constitutional:      General: He is not in acute distress.    Appearance: He is well-developed.  HENT:     Head: Normocephalic and atraumatic.     Mouth/Throat:     Comments: Obvious swelling to left posterior oropharynx, airway does appear to be patent Eyes:     Conjunctiva/sclera: Conjunctivae normal.  Cardiovascular:     Rate and Rhythm: Normal  rate and regular rhythm.     Heart sounds: No murmur heard. Pulmonary:     Effort: Pulmonary effort is normal. No respiratory distress.     Breath sounds: Normal breath sounds.  Abdominal:     Palpations: Abdomen is soft.     Tenderness: There is no abdominal tenderness.  Musculoskeletal:        General: No swelling.     Cervical back: Neck supple.  Skin:    General: Skin is warm and dry.     Capillary Refill: Capillary refill takes less than 2 seconds.  Neurological:     Mental Status: He is alert.  Psychiatric:        Mood and Affect: Mood normal.  ED Results / Procedures / Treatments   Labs (all labs ordered are listed, but only abnormal results are displayed) Labs Reviewed  GROUP A STREP BY PCR - Abnormal; Notable for the following components:      Result Value   Group A Strep by PCR DETECTED (*)    All other components within normal limits  CBC WITH DIFFERENTIAL/PLATELET - Abnormal; Notable for the following components:   WBC 20.0 (*)    Platelets 428 (*)    Neutro Abs 14.5 (*)    Monocytes Absolute 2.5 (*)    Abs Immature Granulocytes 0.08 (*)    All other components within normal limits  COMPREHENSIVE METABOLIC PANEL - Abnormal; Notable for the following components:   Glucose, Bld 135 (*)    Total Protein 9.1 (*)    Total Bilirubin 1.7 (*)    All other components within normal limits  RESP PANEL BY RT-PCR (FLU A&B, COVID) ARPGX2    EKG None  Radiology CT Soft Tissue Neck W Contrast  Result Date: 07/29/2021 CLINICAL DATA:  Soft tissue swelling, infection suspected. Additional history provided: Patient reports sore throat for the last 8 days. EXAM: CT NECK WITH CONTRAST TECHNIQUE: Multidetector CT imaging of the neck was performed using the standard protocol following the bolus administration of intravenous contrast. CONTRAST:  50mL OMNIPAQUE IOHEXOL 300 MG/ML  SOLN COMPARISON:  None FINDINGS: Pharynx and larynx: Prominence of the palatine tonsils.  Additionally, in the region of the left palatine tonsil there is a peripherally enhancing, low-attenuation collection measuring 3.9 x 3.4 x 4.3 cm. The oropharyngeal airway remains patent. Retropharyngeal fluid collection measuring 0.6 cm in AP dimension and 4.5 cm in craniocaudal dimension (for instance as seen on series 3, image 51) (series 6, image 51). There is no well-defined enhancement surrounding this collection at this time. Salivary glands: 9 mm nodule within the inferior aspect of the left parotid gland (series 3, image 43) (series 7, image 58). The right parotid and bilateral submandibular glands are unremarkable. Thyroid: Unremarkable. Lymph nodes: 9 mm nodule within the inferior aspect of the left parotid gland, as described. Enlarged bilateral cervical chain lymph nodes, likely reactive. Vascular: The major vascular structures of the neck are patent. Limited intracranial: No evidence of acute intracranial abnormality within the field of view. Visualized orbits: Incompletely imaged. No mass or acute finding at the imaged levels. Mastoids and visualized paranasal sinuses: Mild mucosal thickening within the left ethmoid sinuses. No significant mastoid effusion. Skeleton: Straightening of the expected cervical lordosis. C6-C7 spondylosis. Upper chest: No consolidation within the imaged lung apices. Other: Nonspecific region of cutaneous/subcutaneous induration within the posterior left upper neck (at the C1 level), measuring 2.6 x 1.3 cm in transaxial dimensions (series 3, image 31). IMPRESSION: Constellation of findings compatible with acute pharyngitis/tonsillitis and large 4.3 cm left peritonsillar abscess. The oropharyngeal airway remains patent. Retropharyngeal collection eccentric to the left, measuring 0.6 cm in AP dimension and 4.5 cm in craniocaudal dimension. There is no well-defined enhancement surrounding this collection, and this likely reflects a retropharyngeal effusion. Enlarged lymph  nodes within the bilateral neck, likely reactive. 9 mm nodule within the inferior aspect of the left parotid gland. This may reflect an enlarged reactive lymph node. Alternatively, this may reflect a primary parotid neoplasm. ENT consultation recommended. Mild mucosal thickening within the left ethmoid sinus. 2.6 x 1.3 cm region of nonspecific cutaneous/subcutaneous soft tissue induration within the posterior left upper neck (at the C1 level). Direct visualization recommended. Electronically Signed   By:  Kellie Simmering D.O.   On: 07/29/2021 10:51    Procedures Procedures   Medications Ordered in ED Medications  clindamycin (CLEOCIN) IVPB 600 mg ( Intravenous MAR Unhold 07/29/21 1549)  multivitamin-iron-minerals-folic acid (CENTRUM) chewable tablet 1 tablet (has no administration in time range)  methocarbamol (ROBAXIN) tablet 500 mg (has no administration in time range)  naproxen (NAPROSYN) tablet 500 mg (has no administration in time range)  dolutegravir-lamiVUDine (DOVATO) 50-300 MG per tablet 1 tablet (has no administration in time range)  hydrochlorothiazide (HYDRODIURIL) tablet 25 mg (has no administration in time range)  amLODipine (NORVASC) tablet 10 mg (has no administration in time range)  clindamycin (CLEOCIN) capsule 300 mg (has no administration in time range)  pantoprazole (PROTONIX) EC tablet 40 mg (has no administration in time range)  dextrose 5 %-0.9 % sodium chloride infusion ( Intravenous New Bag/Given 07/29/21 1601)  phenol (CHLORASEPTIC) mouth spray 1 spray (has no administration in time range)  HYDROcodone-acetaminophen (HYCET) 7.5-325 mg/15 ml solution 10-15 mL (has no administration in time range)  ondansetron (ZOFRAN) tablet 4 mg (has no administration in time range)    Or  ondansetron (ZOFRAN) injection 4 mg (has no administration in time range)  dexamethasone (DECADRON) injection 10 mg (10 mg Intramuscular Given 07/29/21 0930)  iohexol (OMNIPAQUE) 300 MG/ML solution 75 mL  (75 mLs Intravenous Contrast Given 07/29/21 1032)    ED Course  I have reviewed the triage vital signs and the nursing notes.  Pertinent labs & imaging results that were available during my care of the patient were reviewed by me and considered in my medical decision making (see chart for details).    MDM Rules/Calculators/A&P                           45 year old male presenting to ER with concern for worsening sore throat, throat swelling.  CT concerning for large peritonsillar abscess.  Has been on outpatient antibiotics.  Also on CT radiologist commented on retropharyngeal collection likely retropharyngeal effusion.  Also with nodule in left parotid gland.  Placed consult to ENT.  Gave patient dose of IV antibiotics and steroids for now.  Airway is patent.   ENT will take patient to the OR.  Final Clinical Impression(s) / ED Diagnoses Final diagnoses:  Peritonsillar abscess  Parotid nodule    Rx / DC Orders ED Discharge Orders     None        Lucrezia Starch, MD 07/29/21 [phone]

## 2021-07-29 NOTE — Transfer of Care (Signed)
Immediate Anesthesia Transfer of Care Note  Patient: Cody Barton  Procedure(s) Performed: TONSILLECTOMY (Bilateral)  Patient Location: PACU  Anesthesia Type:General  Level of Consciousness: drowsy and patient cooperative  Airway & Oxygen Therapy: Patient Spontanous Breathing and Patient connected to nasal cannula oxygen  Post-op Assessment: Report given to RN, Post -op Vital signs reviewed and stable and Patient moving all extremities  Post vital signs: Reviewed and stable  Last Vitals:  Vitals Value Taken Time  BP 142/85 07/29/21 1453  Temp    Pulse 105 07/29/21 1453  Resp 18 07/29/21 1453  SpO2 96 % 07/29/21 1453  Vitals shown include unvalidated device data.  Last Pain:  Vitals:   07/29/21 1345  TempSrc:   PainSc: 6       Patients Stated Pain Goal: 0 (07/29/21 1345)  Complications: No notable events documented.

## 2021-07-29 NOTE — ED Provider Notes (Signed)
Emergency Medicine Provider Triage Evaluation Note  Cody Barton , a 45 y.o. male  was evaluated in triage.  Pt complains of continued fever and sore throat.  This has been going on for 8 days.  He was seen in the ER 4 days ago for the same, evaluated by ENT.  Decided to do antibiotics and hold on I&D.  He states he has been unable to follow-up with ENT because he cannot use the phone to make an appointment.  Symptoms are worsening despite taking the clindamycin as prescribed.  Review of Systems  Positive: St, fever Negative:  CP  Physical Exam  BP (!) 150/94 (BP Location: Left Arm)   Pulse (!) 116   Temp (!) 100.9 F (38.3 C) (Oral)   Resp 20   SpO2 96%  Gen:   Awake, no distress Resp:  Normal effort MSK:   Moves extremities without difficulty  Other:  Trismus limiting oral exam.  Left tonsillar swelling with unequal palate rise and uvular deviation.  Muffled voice.  Difficulty handling secretions.  Medical Decision Making  Medically screening exam initiated at 9:27 AM.  Appropriate orders placed.  Cody Barton was informed that the remainder of the evaluation will be completed by another provider, this initial triage assessment does not replace that evaluation, and the importance of remaining in the ED until their evaluation is complete.  Labs, ct, IM decadron. Concern for peritonsillar   Alveria Apley, PA-C 07/29/21 8676    Milagros Loll, MD 07/29/21 1019

## 2021-07-29 NOTE — Anesthesia Preprocedure Evaluation (Addendum)
Anesthesia Evaluation  Patient identified by MRN, date of birth, ID band Patient awake    Reviewed: Allergy & Precautions, H&P , NPO status , Patient's Chart, lab work & pertinent test results  Airway Mallampati: IV  TM Distance: >3 FB Neck ROM: Full  Mouth opening: Limited Mouth Opening  Dental no notable dental hx. (+) Teeth Intact, Dental Advisory Given   Pulmonary neg pulmonary ROS, former smoker,    Pulmonary exam normal breath sounds clear to auscultation       Cardiovascular hypertension, Pt. on medications  Rhythm:Regular Rate:Normal     Neuro/Psych Depression negative neurological ROS     GI/Hepatic Neg liver ROS, GERD  Medicated,  Endo/Other  negative endocrine ROS  Renal/GU negative Renal ROS  negative genitourinary   Musculoskeletal   Abdominal   Peds  Hematology  (+) HIV,   Anesthesia Other Findings   Reproductive/Obstetrics negative OB ROS                            Anesthesia Physical Anesthesia Plan  ASA: 2  Anesthesia Plan: General   Post-op Pain Management: Ofirmev IV (intra-op)   Induction: Intravenous  PONV Risk Score and Plan: 3 and Ondansetron, Dexamethasone and Midazolam  Airway Management Planned: Oral ETT  Additional Equipment:   Intra-op Plan:   Post-operative Plan: Extubation in OR  Informed Consent: I have reviewed the patients History and Physical, chart, labs and discussed the procedure including the risks, benefits and alternatives for the proposed anesthesia with the patient or authorized representative who has indicated his/her understanding and acceptance.     Dental advisory given  Plan Discussed with: CRNA  Anesthesia Plan Comments:         Anesthesia Quick Evaluation

## 2021-07-29 NOTE — ED Triage Notes (Signed)
Pt. Stated, Cody Barton got ear pain and sore throat for 8 days.

## 2021-07-29 NOTE — Anesthesia Procedure Notes (Signed)
Procedure Name: Intubation Date/Time: 07/29/2021 2:06 PM Performed by: Moshe Salisbury, CRNA Pre-anesthesia Checklist: Patient identified, Emergency Drugs available, Suction available and Patient being monitored Patient Re-evaluated:Patient Re-evaluated prior to induction Oxygen Delivery Method: Circle System Utilized Preoxygenation: Pre-oxygenation with 100% oxygen Induction Type: IV induction Ventilation: Mask ventilation without difficulty Laryngoscope Size: Mac and 4 Grade View: Grade II Tube type: Oral Tube size: 7.5 mm Number of attempts: 1 Airway Equipment and Method: Stylet Placement Confirmation: ETT inserted through vocal cords under direct vision, positive ETCO2 and breath sounds checked- equal and bilateral Secured at: 23 cm Tube secured with: Tape Dental Injury: Teeth and Oropharynx as per pre-operative assessment

## 2021-07-29 NOTE — ED Notes (Signed)
Report given to OR.

## 2021-07-29 NOTE — Op Note (Signed)
07/29/2021  2:35 PM  PATIENT:  Cody Barton  45 y.o. male  PRE-OPERATIVE DIAGNOSIS:  Recurrent Peritonsillar Abscess  POST-OPERATIVE DIAGNOSIS:  Recurrent Peritonsillar Abscess  PROCEDURE:  Procedure(s): TONSILLECTOMY  SURGEON:  Surgeon(s): Serena Colonel, MD  ANESTHESIA:   General  COUNTS: Correct  EBL: 100cc  DICTATION: The patient was taken to the operating room and placed on the operating table in the supine position. Following induction of general endotracheal anesthesia, the table was turned and the patient was draped in a standard fashion. A Crowe-Davis mouthgag was inserted into the oral cavity and used to retract the tongue and mandible, then attached to the Mayo stand.  The tonsillectomy was then performed using electrocautery dissection, carefully dissecting the avascular plane between the capsule and constrictor muscles. Suction Cautery was used for completion of hemostasis. The tonsils were very large and inflamed and a large abscess cavity filled with pus was evacuated on the left side, and were discarded.  The pharynx was irrigated with saline and suctioned. An oral gastric tube was used to aspirate the contents of the stomach. The patient was then awakened from anesthesia and transferred to PACU in stable condition.   PATIENT DISPOSITION:  To PACA, stable

## 2021-07-29 NOTE — H&P (Signed)
Cody Barton is an 45 y.o. male.   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  I saw him in the emergency department last week at that time he declined any surgical intervention.  He is gotten worse.  He returned to the emergency department.  CT scan shows a large left peritonsillar abscess.  Past Medical History:  Diagnosis Date   Atypical chest pain 04/08/2019   Epididymoorchitis 01/14/2020   Exposure to COVID-19 virus 09/09/2019   GERD (gastroesophageal reflux disease) 01/07/2019   Gonorrhea    HIV (human immunodeficiency virus infection) (HCC)    Hypertension    Major depressive disorder, single episode 04/19/2016   Otitis media 12/16/2014   Seasonal allergies 12/16/2014   Sore throat 12/16/2014   Vaccine counseling 08/31/2020    No past surgical history on file.  Family History  Problem Relation Age of Onset   Hypertension Father    Cancer Maternal Grandfather    Social History:  reports that he quit smoking about 3 years ago. His smoking use included cigars and cigarettes. He has a 0.45 pack-year smoking history. He has never used smokeless tobacco. He reports current alcohol use. He reports current drug use. Frequency: 3.00 times per week. Drug: Marijuana.  Allergies: No Known Allergies  (Not in a hospital admission)   Results for orders placed or performed during the hospital encounter of 07/29/21 (from the past 48 hour(s))  Resp Panel by RT-PCR (Flu A&B, Covid) Nasopharyngeal Swab     Status: None   Collection Time: 07/29/21  9:22 AM   Specimen: Nasopharyngeal Swab; Nasopharyngeal(NP) swabs in vial transport medium  Result Value Ref Range   SARS Coronavirus 2 by RT PCR NEGATIVE NEGATIVE    Comment: (NOTE) SARS-CoV-2 target nucleic acids are NOT DETECTED.  The SARS-CoV-2 RNA is generally detectable in upper respiratory specimens during the acute phase of infection. The lowest concentration of SARS-CoV-2  viral copies this assay can detect is 138 copies/mL. A negative result does not preclude SARS-Cov-2 infection and should not be used as the sole basis for treatment or other patient management decisions. A negative result may occur with  improper specimen collection/handling, submission of specimen other than nasopharyngeal swab, presence of viral mutation(s) within the areas targeted by this assay, and inadequate number of viral copies(<138 copies/mL). A negative result must be combined with clinical observations, patient history, and epidemiological information. The expected result is Negative.  Fact Sheet for Patients:  BloggerCourse.com  Fact Sheet for Healthcare Providers:  SeriousBroker.it  This test is no t yet approved or cleared by the [place] FDA and  has been authorized for detection and/or diagnosis of SARS-CoV-2 by FDA under an Emergency Use Authorization (EUA). This EUA will remain  in effect (meaning this test can be used) for the duration of the COVID-19 declaration under Section 564(b)(1) of the Act, 21 U.S.C.section 360bbb-3(b)(1), unless the authorization is terminated  or revoked sooner.       Influenza A by PCR NEGATIVE NEGATIVE   Influenza B by PCR NEGATIVE NEGATIVE    Comment: (NOTE) The Xpert Xpress SARS-CoV-2/FLU/RSV plus assay is intended as an aid in the diagnosis of influenza from Nasopharyngeal swab specimens and should not be used as a sole basis for treatment. Nasal washings and aspirates are unacceptable for Xpert Xpress SARS-CoV-2/FLU/RSV testing.  Fact Sheet for Patients: BloggerCourse.com  Fact Sheet for Healthcare Providers: SeriousBroker.it  This test is not yet approved or cleared by the [place] FDA  and has been authorized for detection and/or diagnosis of SARS-CoV-2 by FDA under an Emergency Use Authorization (EUA). This EUA  will remain in effect (meaning this test can be used) for the duration of the COVID-19 declaration under Section 564(b)(1) of the Act, 21 U.S.C. section 360bbb-3(b)(1), unless the authorization is terminated or revoked.  Performed at [facility], [address] [place], [place] [address]   Group A Strep by PCR     Status: Abnormal   Collection Time: 07/29/21  9:23 AM   Specimen: Throat; Sterile Swab  Result Value Ref Range   Group A Strep by PCR DETECTED (A) NOT DETECTED    Comment: Performed at [facility], [address] [place], [place] [address]  CBC with Differential     Status: Abnormal   Collection Time: 07/29/21  9:30 AM  Result Value Ref Range   WBC 20.0 (H) 4.0 - 10.5 K/uL   RBC 4.99 4.22 - 5.81 MIL/uL   Hemoglobin 15.6 13.0 - 17.0 g/dL   HCT 22.2 97.9 - 89.2 %   MCV 96.0 80.0 - 100.0 fL   MCH 31.3 26.0 - 34.0 pg   MCHC 32.6 30.0 - 36.0 g/dL   RDW 11.9 41.7 - 40.8 %   Platelets 428 (H) 150 - 400 K/uL   nRBC 0.0 0.0 - 0.2 %   Neutrophils Relative % 73 %   Neutro Abs 14.5 (H) 1.7 - 7.7 K/uL   Lymphocytes Relative 14 %   Lymphs Abs 2.7 0.7 - 4.0 K/uL   Monocytes Relative 13 %   Monocytes Absolute 2.5 (H) 0.1 - 1.0 K/uL   Eosinophils Relative 0 %   Eosinophils Absolute 0.0 0.0 - 0.5 K/uL   Basophils Relative 0 %   Basophils Absolute 0.1 0.0 - 0.1 K/uL   Immature Granulocytes 0 %   Abs Immature Granulocytes 0.08 (H) 0.00 - 0.07 K/uL    Comment: Performed at [facility], [address], [place], [place] [address]  Comprehensive metabolic panel     Status: Abnormal   Collection Time: 07/29/21  9:30 AM  Result Value Ref Range   Sodium 138 135 - 145 mmol/L   Potassium 3.8 3.5 - 5.1 mmol/L   Chloride 98 98 - 111 mmol/L   CO2 29 22 - 32 mmol/L   Glucose, Bld 135 (H) 70 - 99 mg/dL    Comment: Glucose reference range applies only to samples taken after fasting for at least 8 hours.   BUN 13 6 - 20 mg/dL   Creatinine, Ser 8.56 0.61 - 1.24 mg/dL    Calcium 31.4 8.9 - 97.0 mg/dL   Total Protein 9.1 (H) 6.5 - 8.1 g/dL   Albumin 3.6 3.5 - 5.0 g/dL   AST 15 15 - 41 U/L   ALT 19 0 - 44 U/L   Alkaline Phosphatase 76 38 - 126 U/L   Total Bilirubin 1.7 (H) 0.3 - 1.2 mg/dL   GFR, Estimated >26 >37 mL/min    Comment: (NOTE) Calculated using the CKD-EPI Creatinine Equation (2021)    Anion gap 11 5 - 15    Comment: Performed at [facility], [address] [place], [place] [address]   CT Soft Tissue Neck W Contrast  Result Date: 07/29/2021 CLINICAL DATA:  Soft tissue swelling, infection suspected. Additional history provided: Patient reports sore throat for the last 8 days. EXAM: CT NECK WITH CONTRAST TECHNIQUE: Multidetector CT imaging of the neck was performed  using the standard protocol following the bolus administration of intravenous contrast. CONTRAST:  78mL OMNIPAQUE IOHEXOL 300 MG/ML  SOLN COMPARISON:  None FINDINGS: Pharynx and larynx: Prominence of the palatine tonsils. Additionally, in the region of the left palatine tonsil there is a peripherally enhancing, low-attenuation collection measuring 3.9 x 3.4 x 4.3 cm. The oropharyngeal airway remains patent. Retropharyngeal fluid collection measuring 0.6 cm in AP dimension and 4.5 cm in craniocaudal dimension (for instance as seen on series 3, image 51) (series 6, image 51). There is no well-defined enhancement surrounding this collection at this time. Salivary glands: 9 mm nodule within the inferior aspect of the left parotid gland (series 3, image 43) (series 7, image 58). The right parotid and bilateral submandibular glands are unremarkable. Thyroid: Unremarkable. Lymph nodes: 9 mm nodule within the inferior aspect of the left parotid gland, as described. Enlarged bilateral cervical chain lymph nodes, likely reactive. Vascular: The major vascular structures of the neck are patent. Limited intracranial: No evidence of acute intracranial abnormality within the field of view. Visualized  orbits: Incompletely imaged. No mass or acute finding at the imaged levels. Mastoids and visualized paranasal sinuses: Mild mucosal thickening within the left ethmoid sinuses. No significant mastoid effusion. Skeleton: Straightening of the expected cervical lordosis. C6-C7 spondylosis. Upper chest: No consolidation within the imaged lung apices. Other: Nonspecific region of cutaneous/subcutaneous induration within the posterior left upper neck (at the C1 level), measuring 2.6 x 1.3 cm in transaxial dimensions (series 3, image 31). IMPRESSION: Constellation of findings compatible with acute pharyngitis/tonsillitis and large 4.3 cm left peritonsillar abscess. The oropharyngeal airway remains patent. Retropharyngeal collection eccentric to the left, measuring 0.6 cm in AP dimension and 4.5 cm in craniocaudal dimension. There is no well-defined enhancement surrounding this collection, and this likely reflects a retropharyngeal effusion. Enlarged lymph nodes within the bilateral neck, likely reactive. 9 mm nodule within the inferior aspect of the left parotid gland. This may reflect an enlarged reactive lymph node. Alternatively, this may reflect a primary parotid neoplasm. ENT consultation recommended. Mild mucosal thickening within the left ethmoid sinus. 2.6 x 1.3 cm region of nonspecific cutaneous/subcutaneous soft tissue induration within the posterior left upper neck (at the C1 level). Direct visualization recommended. Electronically Signed   By: Jackey Loge D.O.   On: 07/29/2021 10:51    ROS: otherwise negative  Blood pressure (!) 151/94, pulse 99, temperature 98.1 F (36.7 C), temperature source Oral, resp. rate 16, SpO2 94 %.  PHYSICAL EXAM: Overall appearance: Ill appearing, in no distress, but in obvious discomfort. Head:  Normocephalic, atraumatic. Ears: External ears look healthy. Nose: External nose is healthy in appearance. Internal nasal exam free of any lesions or obstruction. Oral  Cavity/pharynx: Moderate trismus.  Swelling and fullness of the left soft palate and displacement of the left tonsil. Hypopharynx/Larynx: no signs of any mucosal lesions or masses identified. Vocal cords move normally. Neuro:  No identifiable neurologic deficits. Neck: Tender left level 2 adenopathy.  Studies Reviewed: CT neck reviewed.      Assessment/Plan Recurrent peritonsillar abscess.  Options were discussed.  Recommendation was for acute tonsillectomy.  Risks and benefits were discussed.  All questions were answered.  Serena Colonel 07/29/2021, 12:40 PM

## 2021-07-29 NOTE — ED Notes (Signed)
Pt not wanting to change it to the gown

## 2021-07-29 NOTE — Progress Notes (Signed)
Pt admitted to 6N 06 from the PACU via stretcher. Pt oriented to room and dept. Daughter at bedside. Pt states no pain at present.

## 2021-07-30 ENCOUNTER — Encounter (HOSPITAL_COMMUNITY): Payer: Self-pay | Admitting: Otolaryngology

## 2021-07-30 ENCOUNTER — Other Ambulatory Visit (HOSPITAL_COMMUNITY): Payer: Self-pay

## 2021-07-30 MED ORDER — HYDROCODONE-ACETAMINOPHEN 7.5-325 MG PO TABS
2.0000 | ORAL_TABLET | Freq: Four times a day (QID) | ORAL | 0 refills | Status: DC | PRN
  Filled 2021-07-30: qty 56, 7d supply, fill #0

## 2021-07-30 MED ORDER — ADULT MULTIVITAMIN W/MINERALS CH
1.0000 | ORAL_TABLET | Freq: Every day | ORAL | Status: DC
  Administered 2021-07-30: 1 via ORAL
  Filled 2021-07-30: qty 1

## 2021-07-30 NOTE — Discharge Summary (Signed)
Physician Discharge Summary  Patient ID: Cody Barton MRN: [id number] DOB/AGE: 09-09-1975 45 y.o.  Admit date: 07/29/2021 Discharge date: 07/30/2021  Admission Diagnoses: Recurrent peritonsillar abscess  Discharge Diagnoses:  Principal Problem:   S/P tonsillectomy   Discharged Condition: good  Hospital Course: No complications  Consults: none  Significant Diagnostic Studies: none  Treatments: surgery: Tonsillectomy  Discharge Exam: Blood pressure 121/79, pulse 71, temperature 98.2 F (36.8 C), temperature source Oral, resp. rate 18, SpO2 100 %. PHYSICAL EXAM: Awake and alert.  Breathing well.  Tolerating p.o.  No bleeding.  Disposition: Discharge disposition: 01-Home or Self Care       Discharge Instructions     Diet - low sodium heart healthy   Complete by: As directed    Increase activity slowly   Complete by: As directed       Allergies as of 07/30/2021   No Known Allergies      Medication List     STOP taking these medications    oxyCODONE-acetaminophen 5-325 MG tablet Commonly known as: PERCOCET/ROXICET       TAKE these medications    amLODipine 10 MG tablet Commonly known as: NORVASC Take 1 tablet (10 mg total) by mouth daily.   clindamycin 300 MG capsule Commonly known as: Cleocin Take 1 capsule (300 mg total) by mouth 3 (three) times daily for 10 days.   Dovato 50-300 MG tablet Generic drug: dolutegravir-lamiVUDine Take 1 tablet by mouth daily.   Fish Oil 1200 MG Cpdr Take by mouth.   hydrochlorothiazide 25 MG tablet Commonly known as: HYDRODIURIL TAKE 1 TABLET(25 MG) BY MOUTH DAILY   HYDROcodone-acetaminophen 7.5-325 MG tablet Commonly known as: Norco Take 2 tablets by mouth every 6 (six) hours as needed for moderate pain.   lidocaine 5 % Commonly known as: Lidoderm Place 1 patch onto the skin daily. Remove & Discard patch within 12 hours or as directed by MD   methocarbamol 500 MG tablet Commonly known as:  ROBAXIN Take 1 tablet (500 mg total) by mouth 2 (two) times daily.   multivitamin-iron-minerals-folic acid chewable tablet Chew 1 tablet by mouth daily.   naproxen 500 MG tablet Commonly known as: NAPROSYN Take 1 tablet (500 mg total) by mouth 2 (two) times daily.   omeprazole 20 MG capsule Commonly known as: PRILOSEC Take 1 capsule (20 mg total) by mouth daily.   omeprazole 20 MG capsule Commonly known as: PRILOSEC Take 1 capsule (20 mg total) by mouth daily.        Follow-up Information     Serena Colonel, MD. Schedule an appointment as soon as possible for a visit in 2 week(s).   Specialty: Otolaryngology Contact information: [address] [place] [address] [phone]                 Signed: Serena Colonel 07/30/2021, 11:31 AM

## 2021-07-30 NOTE — Anesthesia Postprocedure Evaluation (Signed)
Anesthesia Post Note  Patient: Cody Barton  Procedure(s) Performed: TONSILLECTOMY (Bilateral)     Patient location during evaluation: Other Anesthesia Type: General Level of consciousness: awake and alert Pain management: pain level controlled Vital Signs Assessment: post-procedure vital signs reviewed and stable Respiratory status: spontaneous breathing, nonlabored ventilation and respiratory function stable Cardiovascular status: blood pressure returned to baseline and stable Postop Assessment: no apparent nausea or vomiting Anesthetic complications: no   No notable events documented.  Last Vitals:  Vitals:   07/30/21 0500 07/30/21 0830  BP: 128/89 121/79  Pulse: 70 71  Resp: 16 18  Temp: 36.9 C 36.8 C  SpO2: 98% 100%    Last Pain:  Vitals:   07/30/21 0830  TempSrc: Oral  PainSc:                  Mee Macdonnell,W. EDMOND

## 2021-08-04 ENCOUNTER — Other Ambulatory Visit: Payer: Self-pay

## 2021-08-04 DIAGNOSIS — I10 Essential (primary) hypertension: Secondary | ICD-10-CM

## 2021-08-04 DIAGNOSIS — B20 Human immunodeficiency virus [HIV] disease: Secondary | ICD-10-CM

## 2021-08-09 LAB — CBC WITH DIFFERENTIAL/PLATELET
Absolute Monocytes: 582 cells/uL (ref 200–950)
Basophils Absolute: 74 cells/uL (ref 0–200)
Basophils Relative: 0.9 %
Eosinophils Absolute: 82 cells/uL (ref 15–500)
Eosinophils Relative: 1 %
HCT: 40.3 % (ref 38.5–50.0)
Hemoglobin: 13.3 g/dL (ref 13.2–17.1)
Lymphs Abs: 2763 cells/uL (ref 850–3900)
MCH: 31.1 pg (ref 27.0–33.0)
MCHC: 33 g/dL (ref 32.0–36.0)
MCV: 94.2 fL (ref 80.0–100.0)
MPV: 9.2 fL (ref 7.5–12.5)
Monocytes Relative: 7.1 %
Neutro Abs: 4699 cells/uL (ref 1500–7800)
Neutrophils Relative %: 57.3 %
Platelets: 471 10*3/uL — ABNORMAL HIGH (ref 140–400)
RBC: 4.28 10*6/uL (ref 4.20–5.80)
RDW: 11.3 % (ref 11.0–15.0)
Total Lymphocyte: 33.7 %
WBC: 8.2 10*3/uL (ref 3.8–10.8)

## 2021-08-09 LAB — COMPLETE METABOLIC PANEL WITH GFR
AG Ratio: 1 (calc) (ref 1.0–2.5)
ALT: 12 U/L (ref 9–46)
AST: 13 U/L (ref 10–40)
Albumin: 4 g/dL (ref 3.6–5.1)
Alkaline phosphatase (APISO): 66 U/L (ref 36–130)
BUN: 15 mg/dL (ref 7–25)
CO2: 32 mmol/L (ref 20–32)
Calcium: 9.8 mg/dL (ref 8.6–10.3)
Chloride: 100 mmol/L (ref 98–110)
Creat: 1.07 mg/dL (ref 0.60–1.29)
Globulin: 4.1 g/dL (calc) — ABNORMAL HIGH (ref 1.9–3.7)
Glucose, Bld: 91 mg/dL (ref 65–99)
Potassium: 3.8 mmol/L (ref 3.5–5.3)
Sodium: 139 mmol/L (ref 135–146)
Total Bilirubin: 0.7 mg/dL (ref 0.2–1.2)
Total Protein: 8.1 g/dL (ref 6.1–8.1)
eGFR: 88 mL/min/{1.73_m2} (ref >=60)

## 2021-08-09 LAB — HIV-1 RNA QUANT-NO REFLEX-BLD
HIV 1 RNA Quant: NOT DETECTED Copies/mL
HIV-1 RNA Quant, Log: NOT DETECTED Log cps/mL

## 2021-08-09 LAB — RPR: RPR Ser Ql: REACTIVE — AB

## 2021-08-09 LAB — FLUORESCENT TREPONEMAL AB(FTA)-IGG-BLD: Fluorescent Treponemal ABS: REACTIVE — AB

## 2021-08-09 LAB — RPR TITER: RPR Titer: 1:1 {titer} — ABNORMAL HIGH

## 2021-08-10 ENCOUNTER — Telehealth: Payer: Self-pay

## 2021-08-10 NOTE — Telephone Encounter (Signed)
Patient came in the office as a walk in, said since his surgery by Dr. Pollyann Kennedy he feel's he has had some complications such as sweats and sore throat. He is a [employer] patient and has nothing until the month or February, is scheduled to come in the Dr. Orvan Falconer on 12/22 , but would like to have a nurse call him at [phone]. He is unable to [place] MyChart at this time

## 2021-08-10 NOTE — Telephone Encounter (Signed)
I spoke with the patient and he is stating having a sore throat and night sweats. Patient states he was having the symptoms prior to having the tonsillectomy on 07/29/21. Patient stated he did stop by Dr. Pollyann Kennedy office (ENT) and was advised that his throat looked fine. Patient denies any fevers or any other symptoms. Patient also does not have insurance or pcp.  Cody Barton

## 2021-08-10 NOTE — Telephone Encounter (Signed)
Patient stating he had a flu and COVID test a week ago and it was negative. His main concern is the night sweats. He is stating only noticing this at night time.

## 2021-08-12 ENCOUNTER — Ambulatory Visit (INDEPENDENT_AMBULATORY_CARE_PROVIDER_SITE_OTHER): Payer: Self-pay | Admitting: Internal Medicine

## 2021-08-12 ENCOUNTER — Encounter: Payer: Self-pay | Admitting: Internal Medicine

## 2021-08-12 ENCOUNTER — Other Ambulatory Visit: Payer: Self-pay

## 2021-08-12 DIAGNOSIS — B20 Human immunodeficiency virus [HIV] disease: Secondary | ICD-10-CM

## 2021-08-12 DIAGNOSIS — R61 Generalized hyperhidrosis: Secondary | ICD-10-CM | POA: Insufficient documentation

## 2021-08-12 DIAGNOSIS — J36 Peritonsillar abscess: Secondary | ICD-10-CM | POA: Insufficient documentation

## 2021-08-12 NOTE — Assessment & Plan Note (Signed)
His peritonsillar abscesses responded well to drainage and clindamycin therapy.  He will complete clindamycin today.

## 2021-08-12 NOTE — Assessment & Plan Note (Signed)
I suspect that his night sweats are just the last symptom to resolve after recent drainage of his pharyngeal abscess.  I do not find any other source for night sweats.  I will check lab work today.

## 2021-08-12 NOTE — Progress Notes (Signed)
Patient Active Problem List   Diagnosis Date Noted   Peritonsillar abscess 08/12/2021    Priority: High   Night sweats 08/12/2021    Priority: High   S/P tonsillectomy 07/29/2021    Priority: High   HIV disease (HCC) 10/10/2012    Priority: Medium    Vaccine counseling 08/31/2020   Epididymoorchitis 01/14/2020   Exposure to COVID-19 virus 09/09/2019   Atypical chest pain 04/08/2019   GERD (gastroesophageal reflux disease) 01/07/2019   Healthcare maintenance 06/12/2018   Elevated serum creatinine 06/12/2018   Major depressive disorder, single episode 04/19/2016   Otitis media 12/16/2014   Seasonal allergies 12/16/2014   HTN (hypertension) 10/10/2012    Patient's Medications  New Prescriptions   No medications on file  Previous Medications   AMLODIPINE (NORVASC) 10 MG TABLET    Take 1 tablet (10 mg total) by mouth daily.   DOLUTEGRAVIR-LAMIVUDINE (DOVATO) 50-300 MG TABS    Take 1 tablet by mouth daily.   HYDROCHLOROTHIAZIDE (HYDRODIURIL) 25 MG TABLET    TAKE 1 TABLET(25 MG) BY MOUTH DAILY   HYDROCODONE-ACETAMINOPHEN (NORCO) 7.5-325 MG TABLET    Take 2 tablets by mouth every 6 (six) hours as needed for moderate pain.   LIDOCAINE (LIDODERM) 5 %    Place 1 patch onto the skin daily. Remove & Discard patch within 12 hours or as directed by MD   MULTIPLE VITAMIN (MULTIVITAMIN WITH MINERALS) TABS TABLET    Take 1 tablet by mouth daily.   MULTIVITAMIN-IRON-MINERALS-FOLIC ACID (CENTRUM) CHEWABLE TABLET    Chew 1 tablet by mouth daily.   NAPROXEN (NAPROSYN) 500 MG TABLET    Take 1 tablet (500 mg total) by mouth 2 (two) times daily.   OMEGA-3 FATTY ACIDS (FISH OIL) 1200 MG CPDR    Take by mouth.   OMEPRAZOLE (PRILOSEC) 20 MG CAPSULE    Take 1 capsule (20 mg total) by mouth daily.   OMEPRAZOLE (PRILOSEC) 20 MG CAPSULE    Take 1 capsule (20 mg total) by mouth daily.  Modified Medications   No medications on file  Discontinued Medications   No medications on file     Subjective: Cody Barton is in for a hospital follow-up visit.  He has a history of recurrent tonsillitis and peritonsillar abscess.  This is required incision and drainage on several occasions in the remote past.  He recently developed progressive worsening sore throat and was seen in the ED on 07/26/2021.  Strep test was negative.  Left tonsillar swelling was noted on exam.  He was discharged on oral clindamycin but had progressive worsening of his pain leading to admission on 07/29/2021.  A CT scan showed a large left peritonsillar abscess.  He underwent incision and drainage and tonsillectomy that day.  No cultures were obtained.  He was discharged on clindamycin again and has 1 pill left.  Overall he is feeling much better.  His pain has resolved completely and he was able to eat some fried chicken yesterday.  He is no longer having fever or chills but he continues to have some night sweats.  He denies missing any doses of his Dovato.  He has not had any problems tolerating clindamycin.  He has not had any nausea, vomiting or diarrhea.  He followed up with Dr. Brynda Peon, his ENT surgeon 2 days ago.  He noted that his pharynx was healing appropriately without complications.  Review of Systems: Review of Systems  Constitutional:  Positive for diaphoresis  and weight loss. Negative for chills and fever.  HENT:  Negative for congestion, sinus pain and sore throat.   Respiratory:  Negative for cough and shortness of breath.   Cardiovascular:  Negative for chest pain.  Gastrointestinal:  Negative for abdominal pain, diarrhea, nausea and vomiting.  Genitourinary:  Negative for dysuria.  Skin:  Negative for rash.   Past Medical History:  Diagnosis Date   Atypical chest pain 04/08/2019   Epididymoorchitis 01/14/2020   Exposure to COVID-19 virus 09/09/2019   GERD (gastroesophageal reflux disease) 01/07/2019   Gonorrhea    HIV (human immunodeficiency virus infection) (HCC)    Hypertension     Major depressive disorder, single episode 04/19/2016   Otitis media 12/16/2014   Seasonal allergies 12/16/2014   Sore throat 12/16/2014   Vaccine counseling 08/31/2020    Social History   Tobacco Use   Smoking status: Former    Packs/day: 0.30    Years: 1.50    Pack years: 0.45    Types: Cigars, Cigarettes    Quit date: 08/21/2017    Years since quitting: 3.9   Smokeless tobacco: Never  Vaping Use   Vaping Use: Never used  Substance Use Topics   Alcohol use: Yes    Comment:    weekend   Drug use: Yes    Frequency: 3.0 times per week    Types: Marijuana    Comment: daily use     Family History  Problem Relation Age of Onset   Hypertension Father    Cancer Maternal Grandfather     No Known Allergies  Health Maintenance  Topic Date Due   TETANUS/TDAP  Never done   Pneumococcal Vaccine 14-19 Years old (3 - PPSV23 if available, else PCV20) 03/07/2019   INFLUENZA VACCINE  03/22/2021   COVID-19 Vaccine (2 - Pfizer risk series) 04/13/2021   Hepatitis C Screening  Completed   HIV Screening  Completed   HPV VACCINES  Aged Out    Objective:  Vitals:   08/12/21 1007  BP: 114/76  Pulse: 83  Temp: 97.7 F (36.5 C)  TempSrc: Oral  SpO2: 100%  Weight: 196 lb (88.9 kg)  Height: 6\' 1"  (1.854 m)   Body mass index is 25.86 kg/m.  Physical Exam Constitutional:      Comments: He is talkative and in good spirits.  HENT:     Mouth/Throat:     Mouth: Mucous membranes are moist.     Pharynx: Oropharynx is clear. No oropharyngeal exudate or posterior oropharyngeal erythema.     Comments: No pharyngeal asymmetry noted. Cardiovascular:     Rate and Rhythm: Normal rate and regular rhythm.     Heart sounds: No murmur heard. Pulmonary:     Effort: Pulmonary effort is normal.     Breath sounds: Normal breath sounds.  Abdominal:     Palpations: Abdomen is soft.     Tenderness: There is no abdominal tenderness.  Lymphadenopathy:     Head:     Right side of head: No  submandibular adenopathy.     Left side of head: No submandibular adenopathy.     Cervical: No cervical adenopathy.  Skin:    Findings: No rash.  Psychiatric:        Mood and Affect: Mood normal.    Lab Results Lab Results  Component Value Date   WBC 8.2 08/04/2021   HGB 13.3 08/04/2021   HCT 40.3 08/04/2021   MCV 94.2 08/04/2021   PLT 471 (H) 08/04/2021  Lab Results  Component Value Date   CREATININE 1.07 08/04/2021   BUN 15 08/04/2021   NA 139 08/04/2021   K 3.8 08/04/2021   CL 100 08/04/2021   CO2 32 08/04/2021    Lab Results  Component Value Date   ALT 12 08/04/2021   AST 13 08/04/2021   ALKPHOS 76 07/29/2021   BILITOT 0.7 08/04/2021    Lab Results  Component Value Date   CHOL 166 09/04/2018   HDL 31 (L) 09/04/2018   LDLCALC  09/04/2018     Comment:     . LDL cholesterol not calculated. Triglyceride levels greater than 400 mg/dL invalidate calculated LDL results. . Reference range: <100 . Desirable range <100 mg/dL for primary prevention;   <70 mg/dL for patients with CHD or diabetic patients  with > or = 2 CHD risk factors. Marland Kitchen LDL-C is now calculated using the Martin-Hopkins  calculation, which is a validated novel method providing  better accuracy than the Friedewald equation in the  estimation of LDL-C.  Horald Pollen et al. Lenox Ahr. 8588;502(77): 2061-2068  (http://education.[facility].com/faq/FAQ164)    TRIG 556 (H) 09/04/2018   CHOLHDL 5.4 (H) 09/04/2018   Lab Results  Component Value Date   LABRPR REACTIVE (A) 08/04/2021   RPRTITER 1:1 (H) 08/04/2021   HIV 1 RNA Quant (Copies/mL)  Date Value  08/04/2021 Not Detected  03/19/2021 Not Detected  05/27/2020 <20   CD4 T Cell Abs (/uL)  Date Value  05/27/2020 977  01/14/2020 983  08/28/2019 905     Problem List Items Addressed This Visit       High   Peritonsillar abscess    His peritonsillar abscesses responded well to drainage and clindamycin therapy.  He will complete  clindamycin today.      Night sweats    I suspect that his night sweats are just the last symptom to resolve after recent drainage of his pharyngeal abscess.  I do not find any other source for night sweats.  I will check lab work today.      Relevant Orders   Blood culture (routine single)   Blood culture (routine single)     Medium    HIV disease (HCC)    He is very concerned that his abscess might have been a complication of his HIV infection.  I reassured him that it is not.  His viral load has been consistently undetectable and his CD4 count was very high normal a little over 1 year ago.  I will repeat his CD4 count today.  He will continue Dovato and follow-up with Dr. Algis Liming next month.      Relevant Orders   CBC   Basic metabolic panel   T-helper cell (CD4)- ([facility] clinic only)      Cliffton Asters, MD [facility] [phone] pager   [phone] cell 08/12/2021, 11:08 AM

## 2021-08-12 NOTE — Assessment & Plan Note (Signed)
He is very concerned that his abscess might have been a complication of his HIV infection.  I reassured him that it is not.  His viral load has been consistently undetectable and his CD4 count was very high normal a little over 1 year ago.  I will repeat his CD4 count today.  He will continue Dovato and follow-up with Dr. Algis Liming next month.

## 2021-08-13 LAB — T-HELPER CELL (CD4) - (RCID CLINIC ONLY)
CD4 % Helper T Cell: 39 % (ref 33–65)
CD4 T Cell Abs: 803 /uL (ref 400–1790)

## 2021-08-18 LAB — CULTURE, BLOOD (SINGLE)
MICRO NUMBER:: 12791781
MICRO NUMBER:: 12791782
Result:: NO GROWTH
Result:: NO GROWTH
SPECIMEN QUALITY:: ADEQUATE
SPECIMEN QUALITY:: ADEQUATE

## 2021-08-18 LAB — CBC
HCT: 39.6 % (ref 38.5–50.0)
Hemoglobin: 12.9 g/dL — ABNORMAL LOW (ref 13.2–17.1)
MCH: 30.8 pg (ref 27.0–33.0)
MCHC: 32.6 g/dL (ref 32.0–36.0)
MCV: 94.5 fL (ref 80.0–100.0)
MPV: 9.9 fL (ref 7.5–12.5)
Platelets: 550 10*3/uL — ABNORMAL HIGH (ref 140–400)
RBC: 4.19 10*6/uL — ABNORMAL LOW (ref 4.20–5.80)
RDW: 11.5 % (ref 11.0–15.0)
WBC: 5.2 10*3/uL (ref 3.8–10.8)

## 2021-08-18 LAB — BASIC METABOLIC PANEL
BUN: 14 mg/dL (ref 7–25)
CO2: 28 mmol/L (ref 20–32)
Calcium: 10.3 mg/dL (ref 8.6–10.3)
Chloride: 102 mmol/L (ref 98–110)
Creat: 0.94 mg/dL (ref 0.60–1.29)
Glucose, Bld: 99 mg/dL (ref 65–99)
Potassium: 3.9 mmol/L (ref 3.5–5.3)
Sodium: 139 mmol/L (ref 135–146)

## 2021-09-06 ENCOUNTER — Other Ambulatory Visit: Payer: Self-pay

## 2021-09-20 ENCOUNTER — Encounter: Payer: Self-pay | Admitting: Infectious Disease

## 2021-09-20 ENCOUNTER — Ambulatory Visit (INDEPENDENT_AMBULATORY_CARE_PROVIDER_SITE_OTHER): Payer: Self-pay

## 2021-09-20 ENCOUNTER — Other Ambulatory Visit: Payer: Self-pay

## 2021-09-20 ENCOUNTER — Ambulatory Visit (INDEPENDENT_AMBULATORY_CARE_PROVIDER_SITE_OTHER): Payer: Self-pay | Admitting: Infectious Disease

## 2021-09-20 VITALS — BP 138/94 | HR 67 | Temp 97.5°F | Ht 73.0 in | Wt 210.0 lb

## 2021-09-20 DIAGNOSIS — I10 Essential (primary) hypertension: Secondary | ICD-10-CM

## 2021-09-20 DIAGNOSIS — K219 Gastro-esophageal reflux disease without esophagitis: Secondary | ICD-10-CM

## 2021-09-20 DIAGNOSIS — Z7185 Encounter for immunization safety counseling: Secondary | ICD-10-CM

## 2021-09-20 DIAGNOSIS — Z9089 Acquired absence of other organs: Secondary | ICD-10-CM

## 2021-09-20 DIAGNOSIS — J36 Peritonsillar abscess: Secondary | ICD-10-CM

## 2021-09-20 DIAGNOSIS — R61 Generalized hyperhidrosis: Secondary | ICD-10-CM

## 2021-09-20 DIAGNOSIS — A549 Gonococcal infection, unspecified: Secondary | ICD-10-CM

## 2021-09-20 DIAGNOSIS — B20 Human immunodeficiency virus [HIV] disease: Secondary | ICD-10-CM

## 2021-09-20 DIAGNOSIS — Z23 Encounter for immunization: Secondary | ICD-10-CM

## 2021-09-20 MED ORDER — HYDROCHLOROTHIAZIDE 25 MG PO TABS: ORAL_TABLET | ORAL | 11 refills | Status: DC

## 2021-09-20 MED ORDER — AMLODIPINE BESYLATE 10 MG PO TABS: 10.0000 mg | ORAL_TABLET | Freq: Every day | ORAL | 11 refills | Status: DC

## 2021-09-20 MED ORDER — DOVATO 50-300 MG PO TABS: 1.0000 | ORAL_TABLET | Freq: Every day | ORAL | 11 refills | Status: DC

## 2021-09-20 MED ORDER — OMEPRAZOLE 20 MG PO CPDR
20.0000 mg | DELAYED_RELEASE_CAPSULE | Freq: Every day | ORAL | 11 refills | Status: DC
Start: 2021-09-20 — End: 2023-09-20

## 2021-09-20 NOTE — Progress Notes (Signed)
° °  Covid-19 Vaccination Clinic  Name:  Cody Barton    MRN: [id number] DOB: [birth year]  09/20/2021  [name] was observed post Covid-19 immunization for 15 minutes without incident. He was provided with Vaccine Information Sheet and instruction to access the V-Safe system.   [name] was instructed to call 911 with any severe reactions post vaccine: Difficulty breathing  Swelling of face and throat  A fast heartbeat  A bad rash all over body  Dizziness and weakness   Immunizations Administered     Name Date Dose VIS Date Route   PFIZER Comrnaty(Gray TOP) Covid-19 Vaccine 09/20/2021 11:49 AM 0.3 mL 04/21/2021 Intramuscular   Manufacturer: [employer], [employer]   Lot: [id number]   NDC: [phone]      Manali Mcelmurry T Pricilla Loveless

## 2021-09-20 NOTE — Progress Notes (Signed)
Subjective:  Chief complaint: Still having drenching sweats at night.  Patient ID: Cody Barton, male    DOB: [birth year], 46 y.o.   MRN: [id number]  HPI  Cody Barton is a 46 year old 19 man living with HIV most recently perfectly controlled on Dovato  He has had some atypical chest pain and I rx him PPI for this in case this was GERD.  Cody Barton was recently hospitalized for a severe peritonsillar abscess and underwent tonsillectomy with I&D of abscess.  No culture appears to been done notably was positive for group A strep on screen.  He completed clindamycin and saw my partner to [place] shortly before the winter holidays.  He has had no more pain in the mouth or neck but does continue to have drenching sweats at night on a nightly basis these drenching sweats began when he had his peritonsillar abscess but it persisted despite having been treated.     Past Medical History:  Diagnosis Date   Atypical chest pain 04/08/2019   Epididymoorchitis 01/14/2020   Exposure to COVID-19 virus 09/09/2019   GERD (gastroesophageal reflux disease) 01/07/2019   Gonorrhea    HIV (human immunodeficiency virus infection) ([place])    Hypertension    Major depressive disorder, single episode 04/19/2016   Otitis media 12/16/2014   Seasonal allergies 12/16/2014   Sore throat 12/16/2014   Vaccine counseling 08/31/2020    Past Surgical History:  Procedure Laterality Date   TONSILLECTOMY Bilateral 07/29/2021   Procedure: TONSILLECTOMY;  Surgeon: Izora Gala, MD;  Location: [facility] OR;  Service: ENT;  Laterality: Bilateral;    Family History  Problem Relation Age of Onset   Hypertension Father    Cancer Maternal Grandfather       Social History   Socioeconomic History   Marital status: Single    Spouse name: Not on file   Number of children: Not on file   Years of education: Not on file   Highest education level: Not on file  Occupational History   Not on file  Tobacco Use    Smoking status: Former    Packs/day: 0.30    Years: 1.50    Pack years: 0.45    Types: Cigars, Cigarettes    Quit date: 08/21/2017    Years since quitting: 4.0   Smokeless tobacco: Never  Vaping Use   Vaping Use: Never used  Substance and Sexual Activity   Alcohol use: Yes    Comment:    weekend   Drug use: Yes    Frequency: 3.0 times per week    Types: Marijuana    Comment: daily use    Sexual activity: Yes    Partners: Male    Birth control/protection: None    Comment: declined condoms  Other Topics Concern   Not on file  Social History Narrative   ** Merged History Encounter **       ** Merged History Encounter **       Social Determinants of Health   Financial Resource Strain: Not on file  Food Insecurity: Not on file  Transportation Needs: Not on file  Physical Activity: Not on file  Stress: Not on file  Social Connections: Not on file    No Known Allergies   Current Outpatient Medications:    Dolutegravir-lamiVUDine (DOVATO) 50-300 MG TABS, Take 1 tablet by mouth daily., Disp: 30 tablet, Rfl: 5   HYDROcodone-acetaminophen (NORCO) 7.5-325 MG tablet, Take 2 tablets by mouth every 6 (six) hours as needed for moderate  pain., Disp: 56 tablet, Rfl: 0   lidocaine (LIDODERM) 5 %, Place 1 patch onto the skin daily. Remove & Discard patch within 12 hours or as directed by MD, Disp: 30 patch, Rfl: 0   Multiple Vitamin (MULTIVITAMIN WITH MINERALS) TABS tablet, Take 1 tablet by mouth daily., Disp: , Rfl:    multivitamin-iron-minerals-folic acid (CENTRUM) chewable tablet, Chew 1 tablet by mouth daily., Disp: , Rfl:    naproxen (NAPROSYN) 500 MG tablet, Take 1 tablet (500 mg total) by mouth 2 (two) times daily., Disp: 30 tablet, Rfl: 0   Omega-3 Fatty Acids (FISH OIL) 1200 MG CPDR, Take by mouth., Disp: , Rfl:    omeprazole (PRILOSEC) 20 MG capsule, Take 1 capsule (20 mg total) by mouth daily., Disp: 30 capsule, Rfl: 11   amLODipine (NORVASC) 10 MG tablet, Take 1 tablet (10  mg total) by mouth daily. (Patient not taking: Reported on 09/20/2021), Disp: 30 tablet, Rfl: 11   hydrochlorothiazide (HYDRODIURIL) 25 MG tablet, TAKE 1 TABLET(25 MG) BY MOUTH DAILY (Patient not taking: Reported on 09/20/2021), Disp: 30 tablet, Rfl: 11   omeprazole (PRILOSEC) 20 MG capsule, Take 1 capsule (20 mg total) by mouth daily. (Patient not taking: Reported on 09/20/2021), Disp: 30 capsule, Rfl: 0    Review of Systems  Constitutional:  Positive for diaphoresis. Negative for activity change, appetite change, chills, fatigue, fever and unexpected weight change.  HENT:  Negative for congestion, rhinorrhea, sinus pressure, sneezing, sore throat and trouble swallowing.   Eyes:  Negative for photophobia and visual disturbance.  Respiratory:  Negative for cough, chest tightness, shortness of breath, wheezing and stridor.   Cardiovascular:  Negative for chest pain, palpitations and leg swelling.  Gastrointestinal:  Negative for abdominal distention, abdominal pain, anal bleeding, blood in stool, constipation, diarrhea, nausea and vomiting.  Genitourinary:  Negative for difficulty urinating, dysuria, flank pain and hematuria.  Musculoskeletal:  Negative for arthralgias, back pain, gait problem, joint swelling and myalgias.  Skin:  Negative for color change, pallor, rash and wound.  Neurological:  Negative for dizziness, tremors, weakness, light-headedness and headaches.  Hematological:  Negative for adenopathy. Does not bruise/bleed easily.  Psychiatric/Behavioral:  Negative for agitation, behavioral problems, confusion, decreased concentration, dysphoric mood, sleep disturbance and suicidal ideas.       Objective:   Physical Exam Constitutional:      General: He is not in acute distress.    Appearance: Normal appearance. He is well-developed. He is not ill-appearing or diaphoretic.  HENT:     Head: Normocephalic and atraumatic.     Right Ear: Hearing and external ear normal.     Left Ear:  Hearing and external ear normal.     Nose: No nasal deformity or rhinorrhea.     Mouth/Throat:     Mouth: Mucous membranes are moist.     Tongue: No lesions.     Palate: No mass and lesions.     Pharynx: Oropharynx is clear. Uvula midline. No pharyngeal swelling or oropharyngeal exudate.     Tonsils: No tonsillar exudate or tonsillar abscesses.  Eyes:     General: No scleral icterus.    Conjunctiva/sclera: Conjunctivae normal.     Right eye: Right conjunctiva is not injected.     Left eye: Left conjunctiva is not injected.     Pupils: Pupils are equal, round, and reactive to light.  Neck:     Vascular: No JVD.  Cardiovascular:     Rate and Rhythm: Normal rate and regular rhythm.  Heart sounds: Normal heart sounds, S1 normal and S2 normal. No murmur heard.   No friction rub. No gallop.  Pulmonary:     Effort: Pulmonary effort is normal. No respiratory distress.     Breath sounds: Normal breath sounds. No stridor. No wheezing or rhonchi.  Abdominal:     General: Bowel sounds are normal. There is no distension.     Palpations: Abdomen is soft.     Tenderness: There is no abdominal tenderness.  Musculoskeletal:        General: Normal range of motion.     Right shoulder: Normal.     Left shoulder: Normal.     Cervical back: Normal range of motion and neck supple.     Right hip: Normal.     Left hip: Normal.     Right knee: Normal.     Left knee: Normal.  Lymphadenopathy:     Head:     Right side of head: No submandibular, preauricular or posterior auricular adenopathy.     Left side of head: No submandibular, preauricular or posterior auricular adenopathy.     Cervical: No cervical adenopathy.     Right cervical: No superficial or deep cervical adenopathy.    Left cervical: No superficial or deep cervical adenopathy.  Skin:    General: Skin is warm and dry.     Coloration: Skin is not pale.     Findings: No abrasion, bruising, ecchymosis, erythema, lesion or rash.      Nails: There is no clubbing.  Neurological:     Mental Status: He is alert and oriented to person, place, and time.     Sensory: No sensory deficit.     Coordination: Coordination normal.     Gait: Gait normal.  Psychiatric:        Attention and Perception: He is attentive.        Mood and Affect: Mood normal.        Speech: Speech normal.        Behavior: Behavior normal. Behavior is cooperative.        Thought Content: Thought content normal.        Judgment: Judgment normal.          Assessment & Plan:  Night sweats: Not sure why he still would be having this after his peritonsillar abscess was drained.  Blood cultures when he was admitted were negative though he was already on an oral antibiotic.  I will repeat the blood set of blood cultures today and check QuantiFERON gold.  He has no other focal localizing symptoms.  Also check thyroid function is a differential copies of metabolic panel   HIV disease: I reviewed his most recent viral load which was not detected on December 14 Lab Results  Component Value Date   HIV1RNAQUANT Not Detected 08/04/2021    Reviewed his most recent CD4 count that it of 3  Lab Results  Component Value Date   CD4TABS 803 08/12/2021   CD4TABS 977 05/27/2020   CD4TABS 983 01/14/2020   I am continue his Dovato prescription  Hypertension: Vitals reviewed today I am sending his prescriptions for amlodipine hydrochlorothiazide Vitals:   09/20/21 0944  BP: (!) 138/94  Pulse: 67  Temp: (!) 97.5 F (36.4 C)   Gastroesophageal reflux disease renewing his omeprazole and also counseled him on lifestyle changes to potentially help with this.  Vaccine counseling recommended to get updated COVID-19 booster and Prevnar 20 which she received  I spent 41  minutes with the patient including than 50% of the time in face to face counseling of the patient regarding his night sweats peritonsillar abscess his HIV disease current medications personally  reviewing T of the neck done in December along with review of medical records in preparation for the visit and during the visit and in coordination of his care.

## 2021-09-21 LAB — T-HELPER CELL (CD4) - (RCID CLINIC ONLY)
CD4 % Helper T Cell: 36 % (ref 33–65)
CD4 T Cell Abs: 926 /uL (ref 400–1790)

## 2021-09-25 LAB — CBC WITH DIFFERENTIAL/PLATELET
Absolute Monocytes: 592 cells/uL (ref 200–950)
Basophils Absolute: 81 cells/uL (ref 0–200)
Basophils Relative: 1.4 %
Eosinophils Absolute: 99 cells/uL (ref 15–500)
Eosinophils Relative: 1.7 %
HCT: 44.7 % (ref 38.5–50.0)
Hemoglobin: 14.5 g/dL (ref 13.2–17.1)
Lymphs Abs: 2749 cells/uL (ref 850–3900)
MCH: 31.2 pg (ref 27.0–33.0)
MCHC: 32.4 g/dL (ref 32.0–36.0)
MCV: 96.1 fL (ref 80.0–100.0)
MPV: 9.7 fL (ref 7.5–12.5)
Monocytes Relative: 10.2 %
Neutro Abs: 2279 cells/uL (ref 1500–7800)
Neutrophils Relative %: 39.3 %
Platelets: 354 10*3/uL (ref 140–400)
RBC: 4.65 10*6/uL (ref 4.20–5.80)
RDW: 11.7 % (ref 11.0–15.0)
Total Lymphocyte: 47.4 %
WBC: 5.8 10*3/uL (ref 3.8–10.8)

## 2021-09-25 LAB — CULTURE, BLOOD (SINGLE)
MICRO NUMBER:: 12937695
MICRO NUMBER:: 12937696
Result:: NO GROWTH
Result:: NO GROWTH
SPECIMEN QUALITY:: ADEQUATE
SPECIMEN QUALITY:: ADEQUATE

## 2021-09-25 LAB — QUANTIFERON-TB GOLD PLUS
Mitogen-NIL: >10 IU/mL
NIL: 0.2 IU/mL
QuantiFERON-TB Gold Plus: NEGATIVE
TB1-NIL: 0.01 IU/mL
TB2-NIL: 0.01 IU/mL

## 2021-09-25 LAB — TSH+FREE T4: TSH W/REFLEX TO FT4: 0.69 mIU/L (ref 0.40–4.50)

## 2021-09-25 LAB — HIV-1 RNA QUANT-NO REFLEX-BLD
HIV 1 RNA Quant: NOT DETECTED Copies/mL
HIV-1 RNA Quant, Log: NOT DETECTED Log cps/mL

## 2021-12-22 ENCOUNTER — Other Ambulatory Visit: Payer: Self-pay

## 2021-12-22 DIAGNOSIS — B20 Human immunodeficiency virus [HIV] disease: Secondary | ICD-10-CM

## 2021-12-23 LAB — T-HELPER CELL (CD4) - (RCID CLINIC ONLY)
CD4 % Helper T Cell: 32 % — ABNORMAL LOW (ref 33–65)
CD4 T Cell Abs: 863 /uL (ref 400–1790)

## 2021-12-25 LAB — COMPLETE METABOLIC PANEL WITH GFR
AG Ratio: 1.2 (calc) (ref 1.0–2.5)
ALT: 16 U/L (ref 9–46)
AST: 18 U/L (ref 10–40)
Albumin: 4.2 g/dL (ref 3.6–5.1)
Alkaline phosphatase (APISO): 73 U/L (ref 36–130)
BUN: 15 mg/dL (ref 7–25)
CO2: 27 mmol/L (ref 20–32)
Calcium: 10 mg/dL (ref 8.6–10.3)
Chloride: 104 mmol/L (ref 98–110)
Creat: 1.12 mg/dL (ref 0.60–1.29)
Globulin: 3.5 g/dL (calc) (ref 1.9–3.7)
Glucose, Bld: 123 mg/dL — ABNORMAL HIGH (ref 65–99)
Potassium: 4.3 mmol/L (ref 3.5–5.3)
Sodium: 139 mmol/L (ref 135–146)
Total Bilirubin: 0.9 mg/dL (ref 0.2–1.2)
Total Protein: 7.7 g/dL (ref 6.1–8.1)
eGFR: 83 mL/min/{1.73_m2} (ref >=60)

## 2021-12-25 LAB — LIPID PANEL
Cholesterol: 156 mg/dL (ref <200)
HDL: 49 mg/dL (ref >=40)
LDL Cholesterol (Calc): 90 mg/dL (calc)
Non-HDL Cholesterol (Calc): 107 mg/dL (calc) (ref <130)
Total CHOL/HDL Ratio: 3.2 (calc) (ref <5.0)
Triglycerides: 83 mg/dL (ref <150)

## 2021-12-25 LAB — CBC WITH DIFFERENTIAL/PLATELET
Absolute Monocytes: 665 cells/uL (ref 200–950)
Basophils Absolute: 42 cells/uL (ref 0–200)
Basophils Relative: 0.6 %
Eosinophils Absolute: 49 cells/uL (ref 15–500)
Eosinophils Relative: 0.7 %
HCT: 44 % (ref 38.5–50.0)
Hemoglobin: 14.5 g/dL (ref 13.2–17.1)
Lymphs Abs: 3045 cells/uL (ref 850–3900)
MCH: 31.8 pg (ref 27.0–33.0)
MCHC: 33 g/dL (ref 32.0–36.0)
MCV: 96.5 fL (ref 80.0–100.0)
MPV: 10.1 fL (ref 7.5–12.5)
Monocytes Relative: 9.5 %
Neutro Abs: 3199 cells/uL (ref 1500–7800)
Neutrophils Relative %: 45.7 %
Platelets: 292 10*3/uL (ref 140–400)
RBC: 4.56 10*6/uL (ref 4.20–5.80)
RDW: 11.3 % (ref 11.0–15.0)
Total Lymphocyte: 43.5 %
WBC: 7 10*3/uL (ref 3.8–10.8)

## 2021-12-25 LAB — HIV-1 RNA QUANT-NO REFLEX-BLD
HIV 1 RNA Quant: NOT DETECTED copies/mL
HIV-1 RNA Quant, Log: NOT DETECTED Log copies/mL

## 2021-12-25 LAB — RPR: RPR Ser Ql: NONREACTIVE

## 2022-01-04 ENCOUNTER — Other Ambulatory Visit: Payer: Self-pay

## 2022-01-04 ENCOUNTER — Ambulatory Visit (INDEPENDENT_AMBULATORY_CARE_PROVIDER_SITE_OTHER): Payer: Self-pay | Admitting: Infectious Disease

## 2022-01-04 ENCOUNTER — Ambulatory Visit: Payer: Self-pay | Admitting: Infectious Disease

## 2022-01-04 ENCOUNTER — Encounter: Payer: Self-pay | Admitting: Infectious Disease

## 2022-01-04 VITALS — BP 143/92 | HR 67 | Temp 98.7°F | Wt 206.8 lb

## 2022-01-04 DIAGNOSIS — K219 Gastro-esophageal reflux disease without esophagitis: Secondary | ICD-10-CM

## 2022-01-04 DIAGNOSIS — R0789 Other chest pain: Secondary | ICD-10-CM

## 2022-01-04 DIAGNOSIS — I10 Essential (primary) hypertension: Secondary | ICD-10-CM

## 2022-01-04 DIAGNOSIS — B20 Human immunodeficiency virus [HIV] disease: Secondary | ICD-10-CM

## 2022-01-04 DIAGNOSIS — R739 Hyperglycemia, unspecified: Secondary | ICD-10-CM

## 2022-01-04 HISTORY — DX: Hyperglycemia, unspecified: R73.9

## 2022-01-04 MED ORDER — PITAVASTATIN CALCIUM 4 MG PO TABS: 4.0000 mg | ORAL_TABLET | Freq: Every day | ORAL | 11 refills | Status: DC

## 2022-01-04 NOTE — Progress Notes (Signed)
? ?Subjective:  ?Chief complaint: Complaining of atypical chest pain patient ID: Cody Barton, male    DOB: October 06, 1975, 46 y.o.   MRN: [id number] ? ?HPI ? ?Cody Barton is a 46 year old 46 man living with HIV most recently perfectly controlled on Dovato ? ?He had been hospitalized with peritonsillar abscess that is now resolved. ? ?At one point time I also saw him and he was having some problems with drenching night sweats but this is also resolved he is continue to have atypical chest pain which last for a few seconds to minutes at most and is in the middle of his chest.  He believes it is due to gastroesophageal reflux disease but it is not gone away with omeprazole. ? ? ? ? ? ?Past Medical History:  ?Diagnosis Date  ? Atypical chest pain 04/08/2019  ? Epididymoorchitis 01/14/2020  ? Exposure to COVID-19 virus 09/09/2019  ? GERD (gastroesophageal reflux disease) 01/07/2019  ? Gonorrhea   ? HIV (human immunodeficiency virus infection) ([place])   ? Hypertension   ? Major depressive disorder, single episode 04/19/2016  ? Otitis media 12/16/2014  ? Seasonal allergies 12/16/2014  ? Sore throat 12/16/2014  ? Vaccine counseling 08/31/2020  ? ? ?Past Surgical History:  ?Procedure Laterality Date  ? TONSILLECTOMY Bilateral 07/29/2021  ? Procedure: TONSILLECTOMY;  Surgeon: Izora Gala, MD;  Location: [facility];  Service: ENT;  Laterality: Bilateral;  ? ? ?Family History  ?Problem Relation Age of Onset  ? Hypertension Father   ? Cancer Maternal Grandfather   ? ? ?  ?Social History  ? ?Socioeconomic History  ? Marital status: Single  ?  Spouse name: Not on file  ? Number of children: Not on file  ? Years of education: Not on file  ? Highest education level: Not on file  ?Occupational History  ? Not on file  ?Tobacco Use  ? Smoking status: Former  ?  Packs/day: 0.30  ?  Years: 1.50  ?  Pack years: 0.45  ?  Types: Cigars, Cigarettes  ?  Quit date: 08/21/2017  ?  Years since quitting: 4.3  ? Smokeless tobacco: Never  ?Vaping Use   ? Vaping Use: Never used  ?Substance and Sexual Activity  ? Alcohol use: Yes  ?  Comment:    weekend  ? Drug use: Yes  ?  Frequency: 3.0 times per week  ?  Types: Marijuana  ?  Comment: daily use   ? Sexual activity: Yes  ?  Partners: Male  ?  Birth control/protection: None  ?  Comment: declined condoms  ?Other Topics Concern  ? Not on file  ?Social History Narrative  ? ** Merged History Encounter **  ?    ? ** Merged History Encounter **  ?    ? ?Social Determinants of Health  ? ?Financial Resource Strain: Not on file  ?Food Insecurity: Not on file  ?Transportation Needs: Not on file  ?Physical Activity: Not on file  ?Stress: Not on file  ?Social Connections: Not on file  ? ? ?No Known Allergies ? ? ?Current Outpatient Medications:  ?  amLODipine (NORVASC) 10 MG tablet, Take 1 tablet (10 mg total) by mouth daily., Disp: 30 tablet, Rfl: 11 ?  dolutegravir-lamiVUDine (DOVATO) 50-300 MG tablet, Take 1 tablet by mouth daily., Disp: 30 tablet, Rfl: 11 ?  hydrochlorothiazide (HYDRODIURIL) 25 MG tablet, TAKE 1 TABLET(25 MG) BY MOUTH DAILY, Disp: 30 tablet, Rfl: 11 ?  Multiple Vitamin (MULTIVITAMIN WITH MINERALS) TABS tablet, Take 1  tablet by mouth daily., Disp: , Rfl:  ?  naproxen (NAPROSYN) 500 MG tablet, Take 1 tablet (500 mg total) by mouth 2 (two) times daily., Disp: 30 tablet, Rfl: 0 ?  Omega-3 Fatty Acids (FISH OIL) 1200 MG CPDR, Take by mouth., Disp: , Rfl:  ?  omeprazole (PRILOSEC) 20 MG capsule, Take 1 capsule (20 mg total) by mouth daily., Disp: 30 capsule, Rfl: 11 ?  HYDROcodone-acetaminophen (NORCO) 7.5-325 MG tablet, Take 2 tablets by mouth every 6 (six) hours as needed for moderate pain. (Patient not taking: Reported on 01/04/2022), Disp: 56 tablet, Rfl: 0 ?  lidocaine (LIDODERM) 5 %, Place 1 patch onto the skin daily. Remove & Discard patch within 12 hours or as directed by MD (Patient not taking: Reported on 01/04/2022), Disp: 30 patch, Rfl: 0 ?  multivitamin-iron-minerals-folic acid (CENTRUM) chewable  tablet, Chew 1 tablet by mouth daily. (Patient not taking: Reported on 01/04/2022), Disp: , Rfl:  ?  omeprazole (PRILOSEC) 20 MG capsule, Take 1 capsule (20 mg total) by mouth daily. (Patient not taking: Reported on 09/20/2021), Disp: 30 capsule, Rfl: 0 ? ? ? ?Review of Systems  ?Constitutional:  Negative for activity change, appetite change, chills, diaphoresis, fatigue, fever and unexpected weight change.  ?HENT:  Negative for congestion, rhinorrhea, sinus pressure, sneezing, sore throat and trouble swallowing.   ?Eyes:  Negative for photophobia and visual disturbance.  ?Respiratory:  Negative for cough, chest tightness, shortness of breath, wheezing and stridor.   ?Cardiovascular:  Positive for chest pain. Negative for palpitations and leg swelling.  ?Gastrointestinal:  Negative for abdominal distention, abdominal pain, anal bleeding, blood in stool, constipation, diarrhea, nausea and vomiting.  ?Genitourinary:  Negative for difficulty urinating, dysuria, flank pain and hematuria.  ?Musculoskeletal:  Negative for arthralgias, back pain, gait problem, joint swelling and myalgias.  ?Skin:  Negative for color change, pallor, rash and wound.  ?Neurological:  Negative for dizziness, tremors, weakness and light-headedness.  ?Hematological:  Negative for adenopathy. Does not bruise/bleed easily.  ?Psychiatric/Behavioral:  Negative for agitation, behavioral problems, confusion, decreased concentration, dysphoric mood and sleep disturbance.   ? ?   ?Objective:  ? Physical Exam ?Constitutional:   ?   Appearance: He is well-developed.  ?HENT:  ?   Head: Normocephalic and atraumatic.  ?Eyes:  ?   Conjunctiva/sclera: Conjunctivae normal.  ?Cardiovascular:  ?   Rate and Rhythm: Normal rate and regular rhythm.  ?   Heart sounds: No murmur heard. ?  No gallop.  ?Pulmonary:  ?   Effort: Pulmonary effort is normal. No respiratory distress.  ?   Breath sounds: No stridor. No wheezing.  ?Chest:  ?   Chest wall: No tenderness.   ?Abdominal:  ?   General: There is no distension.  ?   Palpations: Abdomen is soft.  ?Musculoskeletal:     ?   General: No tenderness. Normal range of motion.  ?   Cervical back: Normal range of motion and neck supple.  ?Skin: ?   General: Skin is warm and dry.  ?   Coloration: Skin is not pale.  ?   Findings: No erythema or rash.  ?Neurological:  ?   General: No focal deficit present.  ?   Mental Status: He is alert and oriented to person, place, and time.  ?Psychiatric:     ?   Mood and Affect: Mood normal.     ?   Behavior: Behavior normal.     ?   Thought Content: Thought content normal.     ?  Judgment: Judgment normal.  ? ? ? ? ? ?   ?Assessment & Plan:  ? ?HIV disease: I reviewed his most recent viral load which was not detected on May 3 and his CD4 count which was 863. ? ?I am continue his Dovato prescription he will need to renew his HIV medication assistance program in July or August. ? ?Atypical chest pain: Have offered to refer him to cardiology but he wants to wait for now and see how he does with diet modification and continuing his PPI. ? ?Gastroesophageal reflux disease continue PPI. ? ?Cardiovascular prevention: We will initiate [place] statin based on results from [place] ? ?HTN: I am continuing hydrochlorothiazide and amlodipine. ? ?Hyperglycemia on some of his blood tests: I think these are likely because he ate food but he is worried about diabetes mellitus I will check an A1c ? ?

## 2022-01-05 LAB — HEMOGLOBIN A1C
Hgb A1c MFr Bld: 4.5 % of total Hgb (ref <5.7)
Mean Plasma Glucose: 82 mg/dL
eAG (mmol/L): 4.6 mmol/L

## 2022-03-15 ENCOUNTER — Ambulatory Visit: Payer: Self-pay | Admitting: Infectious Disease

## 2022-03-24 ENCOUNTER — Ambulatory Visit: Payer: Self-pay | Admitting: Infectious Disease

## 2022-03-29 ENCOUNTER — Other Ambulatory Visit: Payer: Self-pay

## 2022-03-29 DIAGNOSIS — Z113 Encounter for screening for infections with a predominantly sexual mode of transmission: Secondary | ICD-10-CM

## 2022-03-29 DIAGNOSIS — B20 Human immunodeficiency virus [HIV] disease: Secondary | ICD-10-CM

## 2022-04-11 ENCOUNTER — Ambulatory Visit: Payer: Self-pay | Admitting: Infectious Disease

## 2022-04-11 NOTE — Progress Notes (Deleted)
Subjective:  Chief complaint: Follow-up for HIV disease on medications    patient ID: Cody Barton, male    DOB: 08-05-1976, 46 y.o.   MRN: [id number]  HPI  Cody Barton is a 46 year old Black man living with HIV most recently perfectly controlled on Dovato        Past Medical History:  Diagnosis Date   Atypical chest pain 04/08/2019   Epididymoorchitis 01/14/2020   Exposure to COVID-19 virus 09/09/2019   GERD (gastroesophageal reflux disease) 01/07/2019   Gonorrhea    HIV (human immunodeficiency virus infection) ([facility])    Hyperglycemia 01/04/2022   Hypertension    Major depressive disorder, single episode 04/19/2016   Otitis media 12/16/2014   Seasonal allergies 12/16/2014   Sore throat 12/16/2014   Vaccine counseling 08/31/2020    Past Surgical History:  Procedure Laterality Date   TONSILLECTOMY Bilateral 07/29/2021   Procedure: TONSILLECTOMY;  Surgeon: Serena Colonel, MD;  Location: [facility];  Service: ENT;  Laterality: Bilateral;    Family History  Problem Relation Age of Onset   Hypertension Father    Cancer Maternal Grandfather       Social History   Socioeconomic History   Marital status: Single    Spouse name: Not on file   Number of children: Not on file   Years of education: Not on file   Highest education level: Not on file  Occupational History   Not on file  Tobacco Use   Smoking status: Former    Packs/day: 0.30    Years: 1.50    Total pack years: 0.45    Types: Cigars, Cigarettes    Quit date: 08/21/2017    Years since quitting: 4.6   Smokeless tobacco: Never  Vaping Use   Vaping Use: Never used  Substance and Sexual Activity   Alcohol use: Yes    Comment:    weekend   Drug use: Yes    Frequency: 3.0 times per week    Types: Marijuana    Comment: daily use    Sexual activity: Yes    Partners: Male    Birth control/protection: None    Comment: declined condoms  Other Topics Concern   Not on file  Social History Narrative    ** Merged History Encounter **       ** Merged History Encounter **       Social Determinants of Health   Financial Resource Strain: Not on file  Food Insecurity: Not on file  Transportation Needs: Not on file  Physical Activity: Not on file  Stress: Not on file  Social Connections: Not on file    No Known Allergies   Current Outpatient Medications:    amLODipine (NORVASC) 10 MG tablet, Take 1 tablet (10 mg total) by mouth daily., Disp: 30 tablet, Rfl: 11   dolutegravir-lamiVUDine (DOVATO) 50-300 MG tablet, Take 1 tablet by mouth daily., Disp: 30 tablet, Rfl: 11   hydrochlorothiazide (HYDRODIURIL) 25 MG tablet, TAKE 1 TABLET(25 MG) BY MOUTH DAILY, Disp: 30 tablet, Rfl: 11   lidocaine (LIDODERM) 5 %, Place 1 patch onto the skin daily. Remove & Discard patch within 12 hours or as directed by MD (Patient not taking: Reported on 01/04/2022), Disp: 30 patch, Rfl: 0   Multiple Vitamin (MULTIVITAMIN WITH MINERALS) TABS tablet, Take 1 tablet by mouth daily., Disp: , Rfl:    multivitamin-iron-minerals-folic acid (CENTRUM) chewable tablet, Chew 1 tablet by mouth daily. (Patient not taking: Reported on 01/04/2022), Disp: , Rfl:  naproxen (NAPROSYN) 500 MG tablet, Take 1 tablet (500 mg total) by mouth 2 (two) times daily., Disp: 30 tablet, Rfl: 0   Omega-3 Fatty Acids (FISH OIL) 1200 MG CPDR, Take by mouth., Disp: , Rfl:    omeprazole (PRILOSEC) 20 MG capsule, Take 1 capsule (20 mg total) by mouth daily. (Patient not taking: Reported on 09/20/2021), Disp: 30 capsule, Rfl: 0   omeprazole (PRILOSEC) 20 MG capsule, Take 1 capsule (20 mg total) by mouth daily., Disp: 30 capsule, Rfl: 11   Pitavastatin Calcium 4 MG TABS, Take 1 tablet (4 mg total) by mouth daily., Disp: 30 tablet, Rfl: 11    Review of Systems     Objective:   Physical Exam        Assessment & Plan:   HIV disease: I am recheck a viral load CD4 count CBC CMP  I am continuing his Dovato prescription  Lab Results   Component Value Date   HIV1RNAQUANT NOT DETECTED 12/22/2021   Lab Results  Component Value Date   CD4TABS 863 12/22/2021   CD4TABS 926 09/20/2021   CD4TABS 803 08/12/2021    Hyperlipidemia: Continue pitavastatin, check lipid panel  HTN:

## 2022-04-12 ENCOUNTER — Other Ambulatory Visit: Payer: Self-pay

## 2022-04-12 DIAGNOSIS — B20 Human immunodeficiency virus [HIV] disease: Secondary | ICD-10-CM

## 2022-04-12 DIAGNOSIS — Z113 Encounter for screening for infections with a predominantly sexual mode of transmission: Secondary | ICD-10-CM

## 2022-04-13 LAB — T-HELPER CELL (CD4) - (RCID CLINIC ONLY)
CD4 % Helper T Cell: 38 % (ref 33–65)
CD4 T Cell Abs: 903 /uL (ref 400–1790)

## 2022-04-14 ENCOUNTER — Telehealth: Payer: Self-pay

## 2022-04-14 DIAGNOSIS — Z202 Contact with and (suspected) exposure to infections with a predominantly sexual mode of transmission: Secondary | ICD-10-CM

## 2022-04-14 DIAGNOSIS — A749 Chlamydial infection, unspecified: Secondary | ICD-10-CM

## 2022-04-14 LAB — URINE CYTOLOGY ANCILLARY ONLY
Chlamydia: POSITIVE — AB
Comment: NEGATIVE
Comment: NORMAL
Neisseria Gonorrhea: NEGATIVE

## 2022-04-14 NOTE — Telephone Encounter (Signed)
Called patient to relay results and see if he is interested in doxy PEP, no answer. Left HIPAA compliant voicemail requesting callback.   Sandie Ano, RN

## 2022-04-14 NOTE — Telephone Encounter (Signed)
-----   Message from Randall Hiss, MD sent at 04/14/2022 12:54 PM EDT ----- Regarding: FW: He should receive doxy 100 mg bid x 7 days. He should then have doxy as 200 mg after sex to prevent these stis ----- Message ----- From: Interface, [facility] Results In Sent: 04/12/2022  11:19 PM EDT To: Randall Hiss, MD

## 2022-04-15 LAB — CBC WITH DIFFERENTIAL/PLATELET
Absolute Monocytes: 500 cells/uL (ref 200–950)
Basophils Absolute: 61 cells/uL (ref 0–200)
Basophils Relative: 1.2 %
Eosinophils Absolute: 41 cells/uL (ref 15–500)
Eosinophils Relative: 0.8 %
HCT: 43.9 % (ref 38.5–50.0)
Hemoglobin: 14.6 g/dL (ref 13.2–17.1)
Lymphs Abs: 2423 cells/uL (ref 850–3900)
MCH: 32 pg (ref 27.0–33.0)
MCHC: 33.3 g/dL (ref 32.0–36.0)
MCV: 96.3 fL (ref 80.0–100.0)
MPV: 9.5 fL (ref 7.5–12.5)
Monocytes Relative: 9.8 %
Neutro Abs: 2076 cells/uL (ref 1500–7800)
Neutrophils Relative %: 40.7 %
Platelets: 302 10*3/uL (ref 140–400)
RBC: 4.56 10*6/uL (ref 4.20–5.80)
RDW: 11.1 % (ref 11.0–15.0)
Total Lymphocyte: 47.5 %
WBC: 5.1 10*3/uL (ref 3.8–10.8)

## 2022-04-15 LAB — COMPLETE METABOLIC PANEL WITH GFR
AG Ratio: 1.3 (calc) (ref 1.0–2.5)
ALT: 21 U/L (ref 9–46)
AST: 23 U/L (ref 10–40)
Albumin: 4.2 g/dL (ref 3.6–5.1)
Alkaline phosphatase (APISO): 63 U/L (ref 36–130)
BUN: 13 mg/dL (ref 7–25)
CO2: 27 mmol/L (ref 20–32)
Calcium: 9.9 mg/dL (ref 8.6–10.3)
Chloride: 104 mmol/L (ref 98–110)
Creat: 1.17 mg/dL (ref 0.60–1.29)
Globulin: 3.2 g/dL (calc) (ref 1.9–3.7)
Glucose, Bld: 110 mg/dL — ABNORMAL HIGH (ref 65–99)
Potassium: 3.8 mmol/L (ref 3.5–5.3)
Sodium: 140 mmol/L (ref 135–146)
Total Bilirubin: 1 mg/dL (ref 0.2–1.2)
Total Protein: 7.4 g/dL (ref 6.1–8.1)
eGFR: 78 mL/min/{1.73_m2} (ref >=60)

## 2022-04-15 LAB — HIV-1 RNA QUANT-NO REFLEX-BLD
HIV 1 RNA Quant: NOT DETECTED Copies/mL
HIV-1 RNA Quant, Log: NOT DETECTED Log cps/mL

## 2022-04-15 LAB — RPR: RPR Ser Ql: NONREACTIVE

## 2022-04-15 NOTE — Telephone Encounter (Signed)
Attempted to call patient to relay results and information about doxyPEP. No answer. Voicemailbox full; unable to leave another message.  Wyvonne Lenz, RN

## 2022-04-18 MED ORDER — DOXYCYCLINE HYCLATE 100 MG PO TABS: 100.0000 mg | ORAL_TABLET | Freq: Two times a day (BID) | ORAL | 0 refills | Status: DC

## 2022-04-18 MED ORDER — DOXYCYCLINE HYCLATE 100 MG PO TABS: ORAL_TABLET | ORAL | 5 refills | Status: DC

## 2022-04-18 NOTE — Addendum Note (Signed)
Addended by: Linna Hoff D on: 04/18/2022 09:50 AM   Modules accepted: Orders

## 2022-04-18 NOTE — Telephone Encounter (Signed)
Spoke with Cody Barton, relayed need for chlamydia treatment. He is interested in doxy PEP for prevention of chlamydia and syphilis.   Advised him that two separate prescriptions for doxycycline will be sent in, one 7-day course to treat his current chlamydia infection and one prescription for PEP.   Advised him to refrain from sex until treatment is completed and to notify sexual partners for testing and treatment.   Sandie Ano, RN

## 2022-05-03 ENCOUNTER — Ambulatory Visit: Payer: Self-pay | Admitting: Infectious Disease

## 2022-05-03 ENCOUNTER — Other Ambulatory Visit: Payer: Self-pay

## 2022-05-03 ENCOUNTER — Ambulatory Visit: Payer: Self-pay

## 2022-05-03 ENCOUNTER — Telehealth: Payer: Self-pay

## 2022-05-03 NOTE — Telephone Encounter (Signed)
Called patient to confirm his appointment today. Was able to connect with patient who states he forgot about his visit, but should be able to make it. Informed him that he has a 15 minute grace period before he needs to reschedule. Verbalized understanding.  If patient is late to visit can see financial and reschedule with provider.  Juanita Laster, RMA

## 2022-05-04 ENCOUNTER — Ambulatory Visit: Payer: Self-pay | Admitting: Infectious Disease

## 2022-08-09 ENCOUNTER — Encounter: Payer: Self-pay | Admitting: Infectious Disease

## 2022-08-09 DIAGNOSIS — E785 Hyperlipidemia, unspecified: Secondary | ICD-10-CM | POA: Insufficient documentation

## 2022-08-09 HISTORY — DX: Hyperlipidemia, unspecified: E78.5

## 2022-08-09 NOTE — Progress Notes (Unsigned)
Subjective:  Chief complaint: For HIV disease on medications    patient ID: Cody Barton, male    DOB: 08-12-1976, 46 y.o.   MRN: [id number]  HPI  Cody Barton is a 46 year old Black man living with HIV most recently perfectly controlled on Dovato  He had been hospitalized with peritonsillar abscess that is now resolved.  At one point time I also saw him and he was having some problems with drenching night sweats but this is also resolved he is continue to have atypical chest pain which last for a few seconds to minutes at most and is in the middle of his chest.  He believes it is due to gastroesophageal reflux disease but it is not gone away with omeprazole.  Also was having some atypical pain that now is no longer happening as frequently now that his heartburn is better controlled.  I wanted to send to cardiology but time he did not have insurance I think he may be eligible for Medicaid which would change that picture.       Past Medical History:  Diagnosis Date   Atypical chest pain 04/08/2019   Epididymoorchitis 01/14/2020   Exposure to COVID-19 virus 09/09/2019   GERD (gastroesophageal reflux disease) 01/07/2019   Gonorrhea    HIV (human immunodeficiency virus infection) ([facility])    Hyperglycemia 01/04/2022   Hypertension    Major depressive disorder, single episode 04/19/2016   Otitis media 12/16/2014   Seasonal allergies 12/16/2014   Sore throat 12/16/2014   Vaccine counseling 08/31/2020    Past Surgical History:  Procedure Laterality Date   TONSILLECTOMY Bilateral 07/29/2021   Procedure: TONSILLECTOMY;  Surgeon: Serena Colonel, MD;  Location: [facility];  Service: ENT;  Laterality: Bilateral;    Family History  Problem Relation Age of Onset   Hypertension Father    Cancer Maternal Grandfather       Social History   Socioeconomic History   Marital status: Single    Spouse name: Not on file   Number of children: Not on file   Years of education: Not on file    Highest education level: Not on file  Occupational History   Not on file  Tobacco Use   Smoking status: Former    Packs/day: 0.30    Years: 1.50    Total pack years: 0.45    Types: Cigars, Cigarettes    Quit date: 08/21/2017    Years since quitting: 4.9   Smokeless tobacco: Never  Vaping Use   Vaping Use: Never used  Substance and Sexual Activity   Alcohol use: Yes    Comment:    weekend   Drug use: Yes    Frequency: 3.0 times per week    Types: Marijuana    Comment: daily use    Sexual activity: Yes    Partners: Male    Birth control/protection: None    Comment: declined condoms  Other Topics Concern   Not on file  Social History Narrative   ** Merged History Encounter **       ** Merged History Encounter **       Social Determinants of Health   Financial Resource Strain: Not on file  Food Insecurity: Not on file  Transportation Needs: Not on file  Physical Activity: Not on file  Stress: Not on file  Social Connections: Not on file    No Known Allergies   Current Outpatient Medications:    amLODipine (NORVASC) 10 MG tablet, Take 1 tablet (10  mg total) by mouth daily., Disp: 30 tablet, Rfl: 11   dolutegravir-lamiVUDine (DOVATO) 50-300 MG tablet, Take 1 tablet by mouth daily., Disp: 30 tablet, Rfl: 11   doxycycline (VIBRA-TABS) 100 MG tablet, Take 1 tablet (100 mg total) by mouth 2 (two) times daily., Disp: 14 tablet, Rfl: 0   doxycycline (VIBRA-TABS) 100 MG tablet, Take 200 mg (2 tablets) after unprotected sex to prevent chlamydia and syphilis, Disp: 60 tablet, Rfl: 5   hydrochlorothiazide (HYDRODIURIL) 25 MG tablet, TAKE 1 TABLET(25 MG) BY MOUTH DAILY, Disp: 30 tablet, Rfl: 11   lidocaine (LIDODERM) 5 %, Place 1 patch onto the skin daily. Remove & Discard patch within 12 hours or as directed by MD (Patient not taking: Reported on 01/04/2022), Disp: 30 patch, Rfl: 0   Multiple Vitamin (MULTIVITAMIN WITH MINERALS) TABS tablet, Take 1 tablet by mouth daily., Disp:  , Rfl:    multivitamin-iron-minerals-folic acid (CENTRUM) chewable tablet, Chew 1 tablet by mouth daily. (Patient not taking: Reported on 01/04/2022), Disp: , Rfl:    naproxen (NAPROSYN) 500 MG tablet, Take 1 tablet (500 mg total) by mouth 2 (two) times daily., Disp: 30 tablet, Rfl: 0   Omega-3 Fatty Acids (FISH OIL) 1200 MG CPDR, Take by mouth., Disp: , Rfl:    omeprazole (PRILOSEC) 20 MG capsule, Take 1 capsule (20 mg total) by mouth daily. (Patient not taking: Reported on 09/20/2021), Disp: 30 capsule, Rfl: 0   omeprazole (PRILOSEC) 20 MG capsule, Take 1 capsule (20 mg total) by mouth daily., Disp: 30 capsule, Rfl: 11   Pitavastatin Calcium 4 MG TABS, Take 1 tablet (4 mg total) by mouth daily., Disp: 30 tablet, Rfl: 11    Review of Systems  Constitutional:  Negative for activity change, appetite change, chills, diaphoresis, fatigue, fever and unexpected weight change.  HENT:  Negative for congestion, rhinorrhea, sinus pressure, sneezing, sore throat and trouble swallowing.   Eyes:  Negative for photophobia and visual disturbance.  Respiratory:  Negative for cough, chest tightness, shortness of breath, wheezing and stridor.   Cardiovascular:  Negative for chest pain, palpitations and leg swelling.  Gastrointestinal:  Negative for abdominal distention, abdominal pain, anal bleeding, blood in stool, constipation, diarrhea, nausea and vomiting.  Genitourinary:  Negative for difficulty urinating, dysuria, flank pain and hematuria.  Musculoskeletal:  Negative for arthralgias, back pain, gait problem, joint swelling and myalgias.  Skin:  Negative for color change, pallor, rash and wound.  Neurological:  Negative for dizziness, tremors, weakness and light-headedness.  Hematological:  Negative for adenopathy. Does not bruise/bleed easily.  Psychiatric/Behavioral:  Negative for agitation, behavioral problems, confusion, decreased concentration, dysphoric mood and sleep disturbance.        Objective:    Physical Exam Constitutional:      Appearance: He is well-developed.  HENT:     Head: Normocephalic and atraumatic.  Eyes:     Conjunctiva/sclera: Conjunctivae normal.  Cardiovascular:     Rate and Rhythm: Normal rate and regular rhythm.  Pulmonary:     Effort: Pulmonary effort is normal. No respiratory distress.     Breath sounds: No wheezing.  Abdominal:     General: There is no distension.     Palpations: Abdomen is soft.  Musculoskeletal:        General: No tenderness. Normal range of motion.     Cervical back: Normal range of motion and neck supple.  Skin:    General: Skin is warm and dry.     Coloration: Skin is not pale.  Findings: No erythema or rash.  Neurological:     General: No focal deficit present.     Mental Status: He is alert and oriented to person, place, and time.  Psychiatric:        Mood and Affect: Mood normal.        Behavior: Behavior normal.        Thought Content: Thought content normal.        Judgment: Judgment normal.           Assessment & Plan:   I will add order HIV viral load CD4 count CBC with differential CMP, RPR GC and chlamydia and I will continue  [employer] prescription   Atypical chest pain: Will refer to cardiology if he gets Medicaid it does seem like it is possibly related to heartburn but would like to make sure he is formally evaluated  Gastroesophageal reflux disease continue omeprazole prescription  Hypertension: Will continue hydrochlorothiazide and amlodipine.  STI screening: checking GC and ID and oropharynx and rectum as well as urine.

## 2022-08-10 ENCOUNTER — Encounter: Payer: Self-pay | Admitting: Infectious Disease

## 2022-08-10 ENCOUNTER — Ambulatory Visit (INDEPENDENT_AMBULATORY_CARE_PROVIDER_SITE_OTHER): Payer: Self-pay | Admitting: Infectious Disease

## 2022-08-10 ENCOUNTER — Ambulatory Visit: Payer: Self-pay

## 2022-08-10 ENCOUNTER — Other Ambulatory Visit: Payer: Self-pay

## 2022-08-10 VITALS — BP 130/76 | HR 72 | Temp 97.7°F | Ht 73.0 in | Wt 204.0 lb

## 2022-08-10 DIAGNOSIS — I10 Essential (primary) hypertension: Secondary | ICD-10-CM

## 2022-08-10 DIAGNOSIS — Z7185 Encounter for immunization safety counseling: Secondary | ICD-10-CM

## 2022-08-10 DIAGNOSIS — K219 Gastro-esophageal reflux disease without esophagitis: Secondary | ICD-10-CM

## 2022-08-10 DIAGNOSIS — A549 Gonococcal infection, unspecified: Secondary | ICD-10-CM

## 2022-08-10 DIAGNOSIS — R0789 Other chest pain: Secondary | ICD-10-CM

## 2022-08-10 DIAGNOSIS — B20 Human immunodeficiency virus [HIV] disease: Secondary | ICD-10-CM

## 2022-08-10 DIAGNOSIS — E785 Hyperlipidemia, unspecified: Secondary | ICD-10-CM

## 2022-08-10 DIAGNOSIS — A749 Chlamydial infection, unspecified: Secondary | ICD-10-CM

## 2022-08-10 MED ORDER — PITAVASTATIN MAGNESIUM 4 MG PO TABS: 4.0000 mg | ORAL_TABLET | Freq: Every day | ORAL | 11 refills | Status: DC

## 2022-08-10 MED ORDER — DOXYCYCLINE HYCLATE 100 MG PO TABS: 100.0000 mg | ORAL_TABLET | Freq: Two times a day (BID) | ORAL | 0 refills | Status: DC

## 2022-08-10 MED ORDER — AMLODIPINE BESYLATE 10 MG PO TABS: 10.0000 mg | ORAL_TABLET | Freq: Every day | ORAL | 11 refills | Status: DC

## 2022-08-10 MED ORDER — DOVATO 50-300 MG PO TABS: 1.0000 | ORAL_TABLET | Freq: Every day | ORAL | 11 refills | Status: DC

## 2022-08-11 LAB — CYTOLOGY, (ORAL, ANAL, URETHRAL) ANCILLARY ONLY
Chlamydia: NEGATIVE
Chlamydia: NEGATIVE
Comment: NEGATIVE
Comment: NEGATIVE
Comment: NORMAL
Comment: NORMAL
Neisseria Gonorrhea: NEGATIVE
Neisseria Gonorrhea: NEGATIVE

## 2022-08-11 LAB — URINE CYTOLOGY ANCILLARY ONLY
Chlamydia: NEGATIVE
Comment: NEGATIVE
Comment: NORMAL
Neisseria Gonorrhea: NEGATIVE

## 2022-08-11 LAB — T-HELPER CELLS (CD4) COUNT (NOT AT ARMC)
CD4 % Helper T Cell: 31 % — ABNORMAL LOW (ref 33–65)
CD4 T Cell Abs: 512 /uL (ref 400–1790)

## 2022-08-13 LAB — COMPLETE METABOLIC PANEL WITH GFR
AG Ratio: 1.2 (calc) (ref 1.0–2.5)
ALT: 32 U/L (ref 9–46)
AST: 31 U/L (ref 10–40)
Albumin: 4.6 g/dL (ref 3.6–5.1)
Alkaline phosphatase (APISO): 76 U/L (ref 36–130)
BUN: 20 mg/dL (ref 7–25)
CO2: 29 mmol/L (ref 20–32)
Calcium: 10.7 mg/dL — ABNORMAL HIGH (ref 8.6–10.3)
Chloride: 100 mmol/L (ref 98–110)
Creat: 1.16 mg/dL (ref 0.60–1.29)
Globulin: 4 g/dL (calc) — ABNORMAL HIGH (ref 1.9–3.7)
Glucose, Bld: 106 mg/dL — ABNORMAL HIGH (ref 65–99)
Potassium: 3.7 mmol/L (ref 3.5–5.3)
Sodium: 139 mmol/L (ref 135–146)
Total Bilirubin: 0.8 mg/dL (ref 0.2–1.2)
Total Protein: 8.6 g/dL — ABNORMAL HIGH (ref 6.1–8.1)
eGFR: 79 mL/min/{1.73_m2} (ref >=60)

## 2022-08-13 LAB — CBC WITH DIFFERENTIAL/PLATELET
Absolute Monocytes: 774 cells/uL (ref 200–950)
Basophils Absolute: 42 cells/uL (ref 0–200)
Basophils Relative: 0.8 %
Eosinophils Absolute: 42 cells/uL (ref 15–500)
Eosinophils Relative: 0.8 %
HCT: 49 % (ref 38.5–50.0)
Hemoglobin: 16.4 g/dL (ref 13.2–17.1)
Lymphs Abs: 1749 cells/uL (ref 850–3900)
MCH: 32.3 pg (ref 27.0–33.0)
MCHC: 33.5 g/dL (ref 32.0–36.0)
MCV: 96.5 fL (ref 80.0–100.0)
MPV: 9.6 fL (ref 7.5–12.5)
Monocytes Relative: 14.6 %
Neutro Abs: 2692 cells/uL (ref 1500–7800)
Neutrophils Relative %: 50.8 %
Platelets: 322 10*3/uL (ref 140–400)
RBC: 5.08 10*6/uL (ref 4.20–5.80)
RDW: 11 % (ref 11.0–15.0)
Total Lymphocyte: 33 %
WBC: 5.3 10*3/uL (ref 3.8–10.8)

## 2022-08-13 LAB — RPR: RPR Ser Ql: NONREACTIVE

## 2022-08-13 LAB — LIPID PANEL
Cholesterol: 145 mg/dL (ref <200)
HDL: 59 mg/dL (ref >=40)
LDL Cholesterol (Calc): 70 mg/dL (calc)
Non-HDL Cholesterol (Calc): 86 mg/dL (calc) (ref <130)
Total CHOL/HDL Ratio: 2.5 (calc) (ref <5.0)
Triglycerides: 79 mg/dL (ref <150)

## 2022-08-13 LAB — HIV-1 RNA QUANT-NO REFLEX-BLD
HIV 1 RNA Quant: NOT DETECTED Copies/mL
HIV-1 RNA Quant, Log: NOT DETECTED Log cps/mL

## 2022-10-15 IMAGING — CT CT NECK W/ CM
5 of 6 series · 14 of 33 positions shown, 16 images · IV contrast (APPLIED)
Comparison: None

CLINICAL DATA: Soft tissue swelling, infection suspected.
Additional history provided: Patient reports sore throat for the
last 8 days.

EXAM:
CT NECK WITH CONTRAST
TECHNIQUE: Multidetector CT imaging of the neck was performed using the
standard protocol following the bolus administration of intravenous
contrast.
CONTRAST:  75mL OMNIPAQUE IOHEXOL 300 MG/ML  SOLN

[Series 3: axial neck · axial · 0.58mm/px · z∈[-255,-173]mm · 2 of 124 slices shown]
[im 42/124  bone]
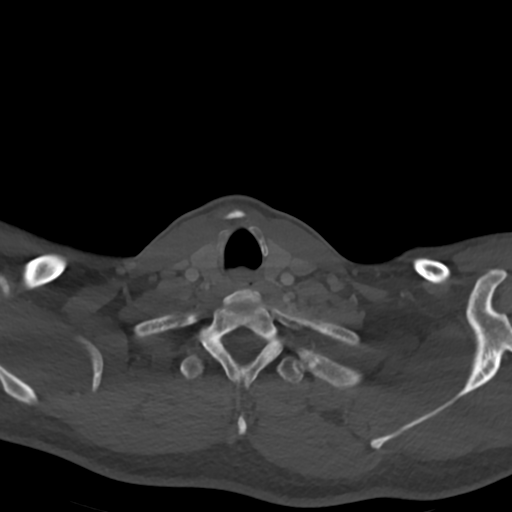
[im 83/124  bone]
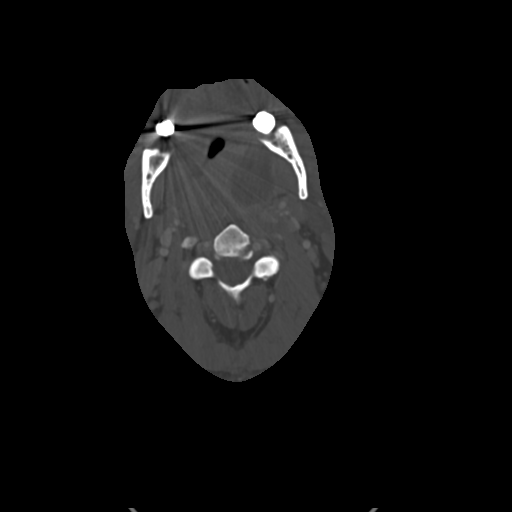

[Series 5: axial bone · axial · 0.58mm/px · z∈[-255,-173]mm · 2 of 124 slices shown]
[im 42/124  bone]
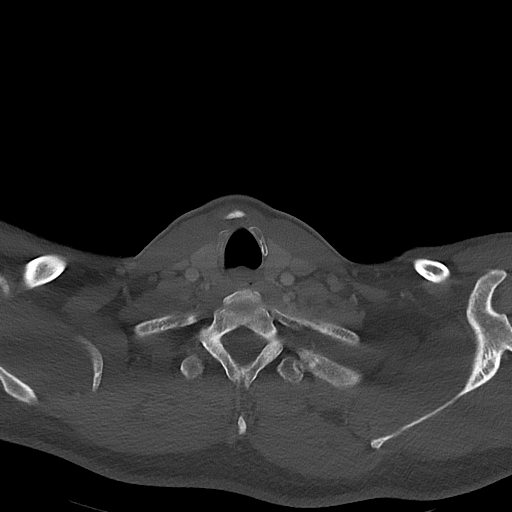
[im 83/124  bone]
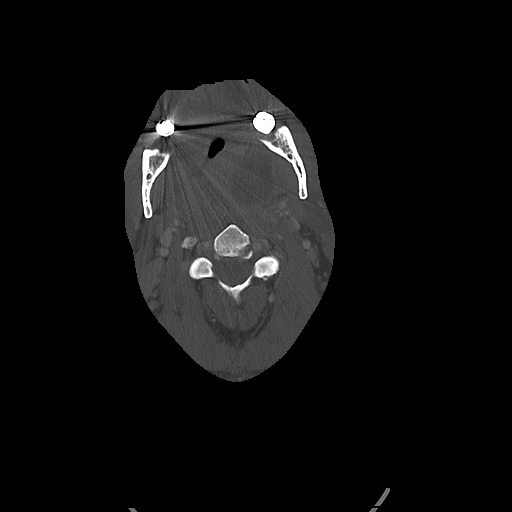

[Series 6: sag neck · sagittal · 0.52mm/px · 5 of 93 slices shown, 6 images]
[im 31/93  bone]
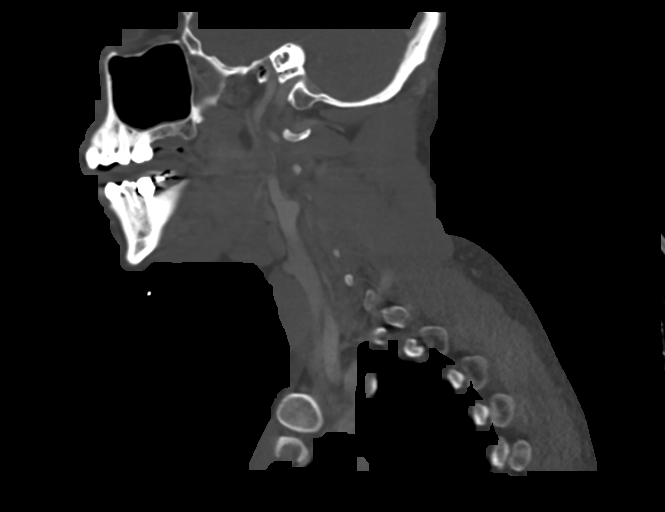
[im 39/93  bone]
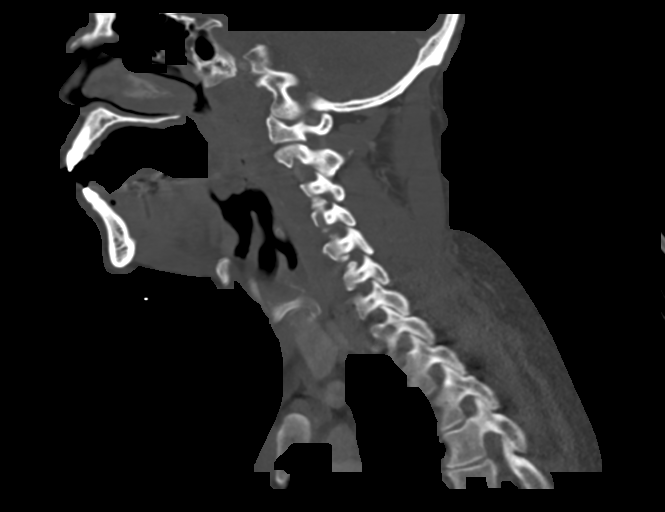
[im 47/93  soft-tissue]
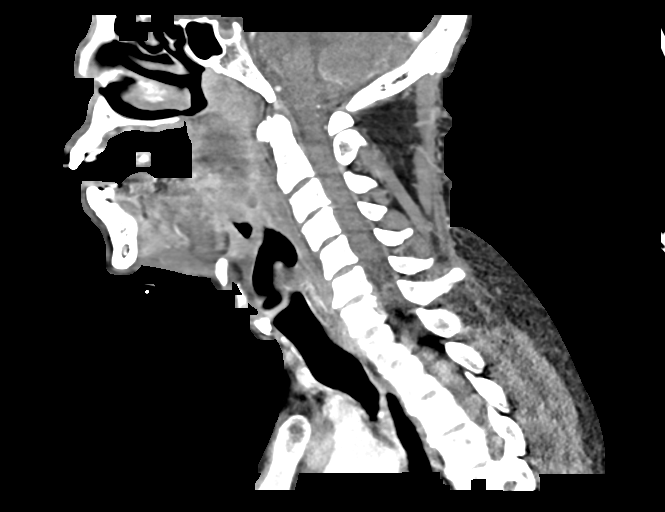
[im 47/93  bone]
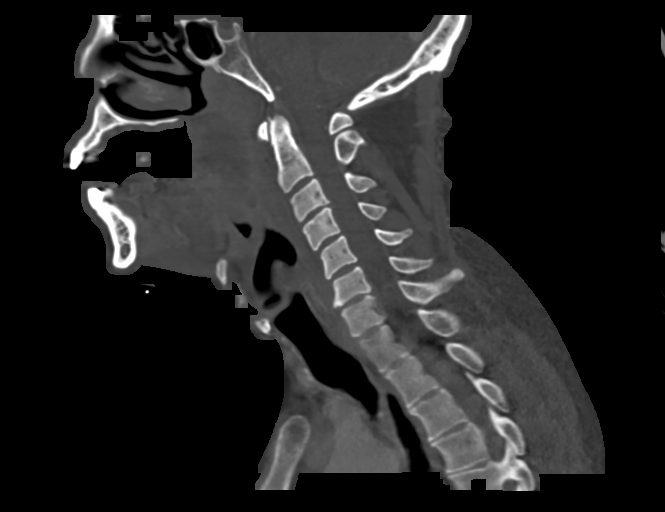
[im 54/93  bone]
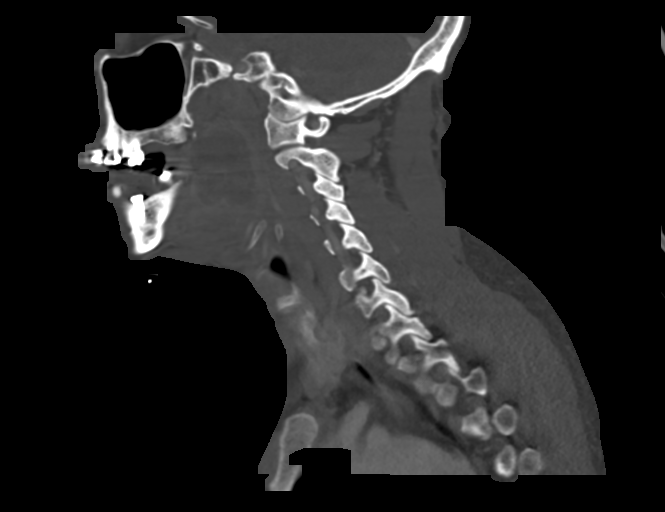
[im 62/93  bone]
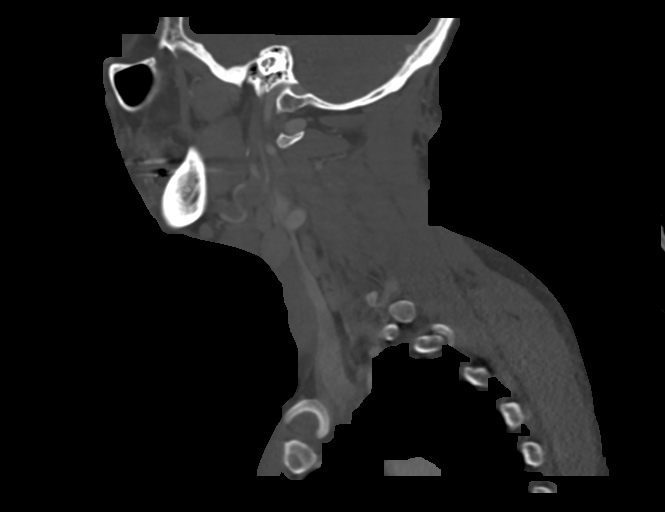

[Series 7: cor neck · coronal · 0.53mm/px · 3 of 139 slices shown]
[im 28/139  bone]
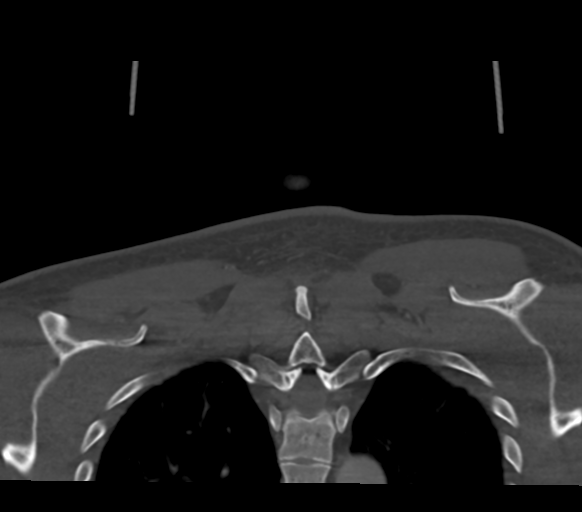
[im 56/139  bone]
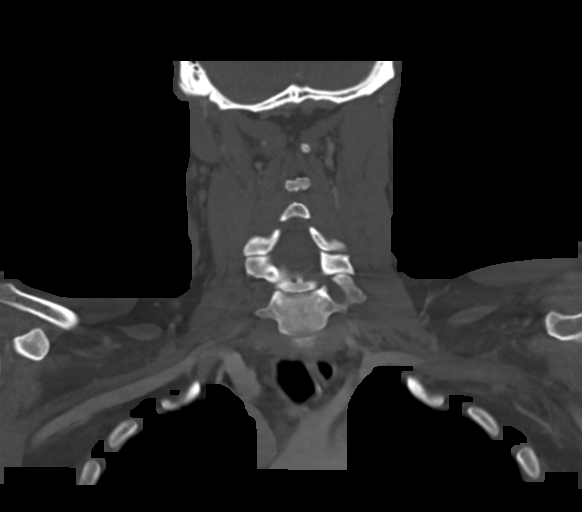
[im 83/139  bone]
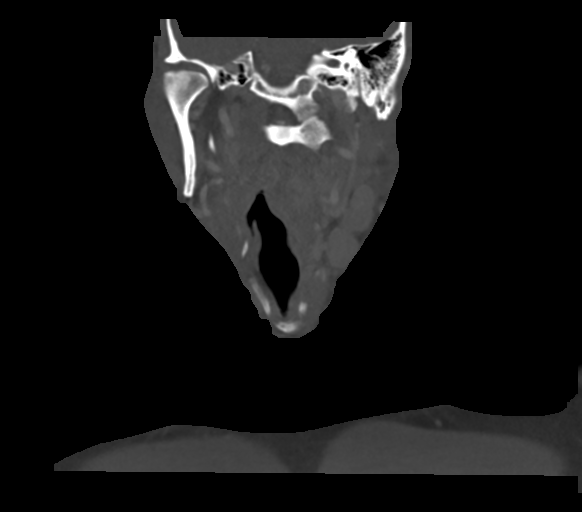

[Series 8: ax oropharynx · axial · 0.59mm/px · z∈[-278,-189]mm · 2 of 135 slices shown, 3 images]
[im 45/135  soft-tissue]
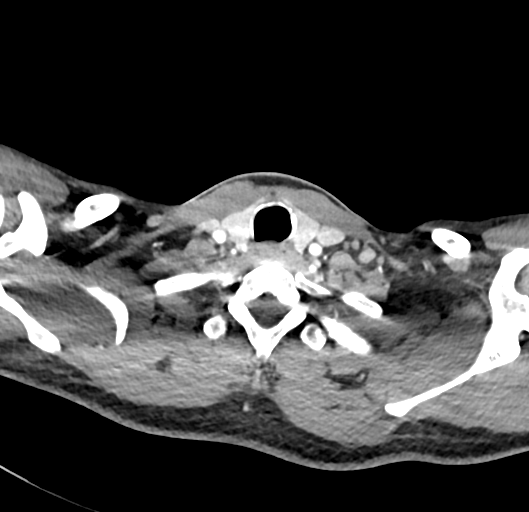
[im 45/135  bone]
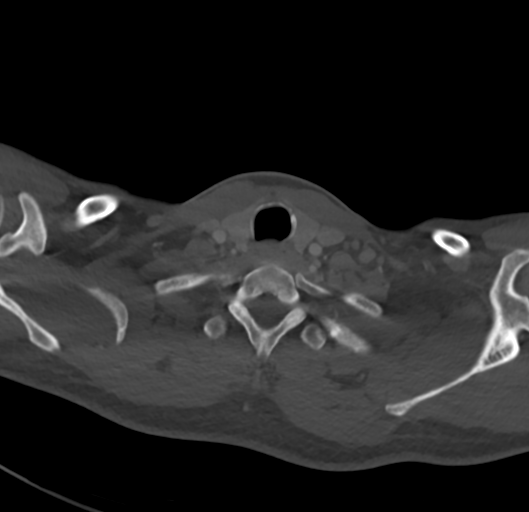
[im 90/135  bone]
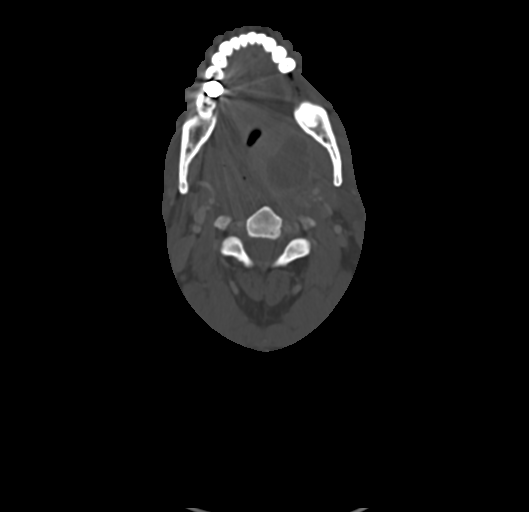

[14 of 33 positions shown; findings below may reference images not displayed]

FINDINGS: Pharynx and larynx: Prominence of the palatine tonsils.
Additionally, in the region of the left palatine tonsil there is a
peripherally enhancing, low-attenuation collection measuring 3.9 x
3.4 x 4.3 cm. The oropharyngeal airway remains patent.
Retropharyngeal fluid collection measuring 0.6 cm in AP dimension
and 4.5 cm in craniocaudal dimension (for instance as seen on series
3, image 51) (series 6, image 51). There is no well-defined
enhancement surrounding this collection at this time.

Salivary glands: 9 mm nodule within the inferior aspect of the left
parotid gland (series 3, image 43) (series 7, image 58). The right
parotid and bilateral submandibular glands are unremarkable.

Thyroid: Unremarkable.

Lymph nodes: 9 mm nodule within the inferior aspect of the left
parotid gland, as described. Enlarged bilateral cervical chain lymph
nodes, likely reactive.

Vascular: The major vascular structures of the neck are patent.

Limited intracranial: No evidence of acute intracranial abnormality
within the field of view.

Visualized orbits: Incompletely imaged. No mass or acute finding at
the imaged levels.

Mastoids and visualized paranasal sinuses: Mild mucosal thickening
within the left ethmoid sinuses. No significant mastoid effusion.

Skeleton: Straightening of the expected cervical lordosis. C6-C7
spondylosis.

Upper chest: No consolidation within the imaged lung apices.

Other: Nonspecific region of cutaneous/subcutaneous induration
within the posterior left upper neck (at the C1 level), measuring
2.6 x 1.3 cm in transaxial dimensions (series 3, image 31).
IMPRESSION: Constellation of findings compatible with acute
pharyngitis/tonsillitis and large 4.3 cm left peritonsillar abscess.
The oropharyngeal airway remains patent.

Retropharyngeal collection eccentric to the left, measuring 0.6 cm
in AP dimension and 4.5 cm in craniocaudal dimension. There is no
well-defined enhancement surrounding this collection, and this
likely reflects a retropharyngeal effusion.

Enlarged lymph nodes within the bilateral neck, likely reactive.

9 mm nodule within the inferior aspect of the left parotid gland.
This may reflect an enlarged reactive lymph node. Alternatively,
this may reflect a primary parotid neoplasm. ENT consultation
recommended.

Mild mucosal thickening within the left ethmoid sinus.

2.6 x 1.3 cm region of nonspecific cutaneous/subcutaneous soft
tissue induration within the posterior left upper neck (at the C1
level). Direct visualization recommended.

## 2022-12-20 ENCOUNTER — Ambulatory Visit: Payer: Self-pay

## 2022-12-28 ENCOUNTER — Other Ambulatory Visit (HOSPITAL_COMMUNITY): Admission: RE | Admit: 2022-12-28 | Payer: Self-pay | Source: Ambulatory Visit

## 2022-12-28 ENCOUNTER — Encounter: Payer: Self-pay | Admitting: Infectious Diseases

## 2022-12-28 ENCOUNTER — Other Ambulatory Visit: Payer: Self-pay

## 2022-12-28 ENCOUNTER — Ambulatory Visit (INDEPENDENT_AMBULATORY_CARE_PROVIDER_SITE_OTHER): Payer: Self-pay | Admitting: Infectious Diseases

## 2022-12-28 VITALS — BP 135/90 | HR 70 | Temp 98.2°F | Wt 204.0 lb

## 2022-12-28 DIAGNOSIS — Z1212 Encounter for screening for malignant neoplasm of rectum: Secondary | ICD-10-CM

## 2022-12-28 DIAGNOSIS — M21941 Unspecified acquired deformity of hand, right hand: Secondary | ICD-10-CM | POA: Insufficient documentation

## 2022-12-28 DIAGNOSIS — B20 Human immunodeficiency virus [HIV] disease: Secondary | ICD-10-CM

## 2022-12-28 DIAGNOSIS — I10 Essential (primary) hypertension: Secondary | ICD-10-CM

## 2022-12-28 DIAGNOSIS — Z113 Encounter for screening for infections with a predominantly sexual mode of transmission: Secondary | ICD-10-CM

## 2022-12-28 MED ORDER — AMLODIPINE BESYLATE 10 MG PO TABS: 10.0000 mg | ORAL_TABLET | Freq: Every day | ORAL | 11 refills | Status: DC

## 2022-12-28 MED ORDER — HYDROCHLOROTHIAZIDE 25 MG PO TABS: ORAL_TABLET | ORAL | 11 refills | Status: DC

## 2022-12-28 NOTE — Assessment & Plan Note (Signed)
Asymptomatic screening today.  
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
## 2022-12-28 NOTE — Assessment & Plan Note (Signed)
Counseled on recommendations to do yearly anorectal cancer screens with pap testing.  He elected to self collect sample - counseled on technique.   Will notify results and if needed further recommendations.

## 2022-12-28 NOTE — Assessment & Plan Note (Signed)
Refill amlodipine and  lisinopril
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
## 2022-12-28 NOTE — Assessment & Plan Note (Addendum)
Very well controlled on once daily Dovato. No concerns with access or adherence to medication. They are tolerating the medication well without side effects. No drug interactions identified. Pertinent lab tests ordered today.  Plans to have him see financial team today for Medicaid enrollment.  No dental needs today.  Continue statin for cardiac prevention - lipid panel in December looks good and he does a good job with lifestyle changes to help with this.  No concern over anxious/depressed mood.  Sexual health and family planning discussed - asymptomatic screening collected - continues on doxy pep.  Vaccines deferred today - to be discussed with Dr Daiva Eves at return.   Return in about 4 months (around 04/30/2023).

## 2022-12-28 NOTE — Addendum Note (Signed)
Addended by: Marcell Anger on: 12/28/2022 04:40 PM   Modules accepted: Orders

## 2022-12-28 NOTE — Patient Instructions (Signed)
Nice to see you again   I will let you know about your results via mychart - will call with anything we need to get in touch with sooner.   Continue all your medication.   Please come back to see Dr. Zenaida Niece dam in 4-6 months.

## 2022-12-29 LAB — CYTOLOGY, (ORAL, ANAL, URETHRAL) ANCILLARY ONLY
Chlamydia: NEGATIVE
Chlamydia: NEGATIVE
Comment: NEGATIVE
Comment: NEGATIVE
Comment: NORMAL
Comment: NORMAL
Neisseria Gonorrhea: NEGATIVE
Neisseria Gonorrhea: NEGATIVE

## 2022-12-29 LAB — URINE CYTOLOGY ANCILLARY ONLY
Chlamydia: NEGATIVE
Comment: NEGATIVE
Comment: NORMAL
Neisseria Gonorrhea: NEGATIVE

## 2022-12-29 LAB — T-HELPER CELLS (CD4) COUNT (NOT AT ARMC)
CD4 % Helper T Cell: 36 % (ref 33–65)
CD4 T Cell Abs: 711 /uL (ref 400–1790)

## 2022-12-30 LAB — CYTOLOGY - PAP: Diagnosis: NEGATIVE

## 2022-12-30 LAB — HIV-1 RNA QUANT-NO REFLEX-BLD
HIV 1 RNA Quant: NOT DETECTED Copies/mL
HIV-1 RNA Quant, Log: NOT DETECTED Log cps/mL

## 2022-12-30 LAB — RPR: RPR Ser Ql: NONREACTIVE

## 2023-09-06 ENCOUNTER — Other Ambulatory Visit (HOSPITAL_COMMUNITY)
Admission: RE | Admit: 2023-09-06 | Discharge: 2023-09-06 | Disposition: A | Discharge status: Home / Self Care | Payer: Medicaid Other | Source: Ambulatory Visit | Attending: Infectious Disease | Admitting: Infectious Disease

## 2023-09-06 ENCOUNTER — Other Ambulatory Visit: Payer: Self-pay

## 2023-09-06 ENCOUNTER — Other Ambulatory Visit: Payer: Medicaid Other

## 2023-09-06 DIAGNOSIS — Z79899 Other long term (current) drug therapy: Secondary | ICD-10-CM

## 2023-09-06 DIAGNOSIS — Z113 Encounter for screening for infections with a predominantly sexual mode of transmission: Secondary | ICD-10-CM

## 2023-09-06 DIAGNOSIS — B20 Human immunodeficiency virus [HIV] disease: Secondary | ICD-10-CM

## 2023-09-07 LAB — URINE CYTOLOGY ANCILLARY ONLY
Chlamydia: NEGATIVE
Comment: NEGATIVE
Comment: NORMAL
Neisseria Gonorrhea: NEGATIVE

## 2023-09-09 LAB — CBC WITH DIFFERENTIAL/PLATELET
Absolute Lymphocytes: 2106 {cells}/uL (ref 850–3900)
Absolute Monocytes: 765 {cells}/uL (ref 200–950)
Basophils Absolute: 72 {cells}/uL (ref 0–200)
Basophils Relative: 0.8 %
Eosinophils Absolute: 261 {cells}/uL (ref 15–500)
Eosinophils Relative: 2.9 %
HCT: 45.3 % (ref 38.5–50.0)
Hemoglobin: 14.6 g/dL (ref 13.2–17.1)
MCH: 30.7 pg (ref 27.0–33.0)
MCHC: 32.2 g/dL (ref 32.0–36.0)
MCV: 95.2 fL (ref 80.0–100.0)
MPV: 9.3 fL (ref 7.5–12.5)
Monocytes Relative: 8.5 %
Neutro Abs: 5796 {cells}/uL (ref 1500–7800)
Neutrophils Relative %: 64.4 %
Platelets: 347 10*3/uL (ref 140–400)
RBC: 4.76 10*6/uL (ref 4.20–5.80)
RDW: 11.1 % (ref 11.0–15.0)
Total Lymphocyte: 23.4 %
WBC: 9 10*3/uL (ref 3.8–10.8)

## 2023-09-09 LAB — COMPLETE METABOLIC PANEL WITH GFR
AG Ratio: 1.2 (calc) (ref 1.0–2.5)
ALT: 14 U/L (ref 9–46)
AST: 16 U/L (ref 10–40)
Albumin: 4.2 g/dL (ref 3.6–5.1)
Alkaline phosphatase (APISO): 90 U/L (ref 36–130)
BUN: 11 mg/dL (ref 7–25)
CO2: 27 mmol/L (ref 20–32)
Calcium: 9.5 mg/dL (ref 8.6–10.3)
Chloride: 104 mmol/L (ref 98–110)
Creat: 0.82 mg/dL (ref 0.60–1.29)
Globulin: 3.5 g/dL (ref 1.9–3.7)
Glucose, Bld: 68 mg/dL (ref 65–99)
Potassium: 4.3 mmol/L (ref 3.5–5.3)
Sodium: 140 mmol/L (ref 135–146)
Total Bilirubin: 0.7 mg/dL (ref 0.2–1.2)
Total Protein: 7.7 g/dL (ref 6.1–8.1)
eGFR: 110 mL/min/{1.73_m2} (ref >=60)

## 2023-09-09 LAB — T-HELPER CELLS (CD4) COUNT (NOT AT ARMC)
Absolute CD4: 840 {cells}/uL (ref 490–1740)
CD4 T Helper %: 36 % (ref 30–61)
Total lymphocyte count: 2346 {cells}/uL (ref 850–3900)

## 2023-09-09 LAB — LIPID PANEL
Cholesterol: 157 mg/dL (ref <200)
HDL: 61 mg/dL (ref >=40)
LDL Cholesterol (Calc): 79 mg/dL
Non-HDL Cholesterol (Calc): 96 mg/dL (ref <130)
Total CHOL/HDL Ratio: 2.6 (calc) (ref <5.0)
Triglycerides: 85 mg/dL (ref <150)

## 2023-09-09 LAB — HIV-1 RNA QUANT-NO REFLEX-BLD
HIV 1 RNA Quant: NOT DETECTED {copies}/mL
HIV-1 RNA Quant, Log: NOT DETECTED {Log}

## 2023-09-09 LAB — RPR: RPR Ser Ql: NONREACTIVE

## 2023-09-20 ENCOUNTER — Other Ambulatory Visit: Payer: Self-pay

## 2023-09-20 ENCOUNTER — Ambulatory Visit: Payer: Self-pay | Admitting: Internal Medicine

## 2023-09-20 ENCOUNTER — Encounter: Payer: Self-pay | Admitting: Internal Medicine

## 2023-09-20 VITALS — BP 179/78 | HR 72 | Temp 98.2°F | Ht 73.0 in | Wt 190.0 lb

## 2023-09-20 DIAGNOSIS — Z113 Encounter for screening for infections with a predominantly sexual mode of transmission: Secondary | ICD-10-CM

## 2023-09-20 DIAGNOSIS — E785 Hyperlipidemia, unspecified: Secondary | ICD-10-CM

## 2023-09-20 DIAGNOSIS — B20 Human immunodeficiency virus [HIV] disease: Secondary | ICD-10-CM

## 2023-09-20 DIAGNOSIS — Z7252 High risk homosexual behavior: Secondary | ICD-10-CM

## 2023-09-20 MED ORDER — ATORVASTATIN CALCIUM 40 MG PO TABS: 40.0000 mg | ORAL_TABLET | Freq: Every day | ORAL | 11 refills | Status: DC

## 2023-09-20 NOTE — Patient Instructions (Signed)
Start atorvastatin for risk for heart attack   Continue dovato   See Zenaida Niece dam in 4 months as before  Standing lab a week before hand
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
## 2023-09-27 ENCOUNTER — Other Ambulatory Visit: Payer: Self-pay

## 2023-09-27 DIAGNOSIS — B20 Human immunodeficiency virus [HIV] disease: Secondary | ICD-10-CM

## 2023-09-27 MED ORDER — DOVATO 50-300 MG PO TABS: 1.0000 | ORAL_TABLET | Freq: Every day | ORAL | 11 refills | Status: DC

## 2023-10-03 ENCOUNTER — Telehealth: Payer: Self-pay

## 2023-10-03 NOTE — Telephone Encounter (Signed)
Received fax from [facility] stating Dovato needs PA and is not covered by plan. Fax returned to [facility] stating patient is covered by [employer].   Sandie Ano, RN

## 2023-12-29 ENCOUNTER — Other Ambulatory Visit: Payer: Self-pay

## 2024-01-02 NOTE — Progress Notes (Signed)
 The 10-year ASCVD risk score (Arnett DK, et al., 2019) is: 19.3%   Values used to calculate the score:     Age: 48 years     Sex: Male     Is Non-Hispanic African American: Yes     Diabetic: No     Tobacco smoker: Yes     Systolic Blood Pressure: 179 mmHg     Is BP treated: Yes     HDL Cholesterol: 61 mg/dL     Total Cholesterol: 157 mg/dL  Arlon Bergamo, BSN, RN

## 2024-01-31 ENCOUNTER — Other Ambulatory Visit

## 2024-01-31 ENCOUNTER — Telehealth: Payer: Self-pay

## 2024-01-31 ENCOUNTER — Other Ambulatory Visit: Payer: Self-pay | Admitting: Infectious Diseases

## 2024-01-31 DIAGNOSIS — I10 Essential (primary) hypertension: Secondary | ICD-10-CM

## 2024-01-31 DIAGNOSIS — Z202 Contact with and (suspected) exposure to infections with a predominantly sexual mode of transmission: Secondary | ICD-10-CM

## 2024-01-31 DIAGNOSIS — B20 Human immunodeficiency virus [HIV] disease: Secondary | ICD-10-CM

## 2024-01-31 MED ORDER — DOXYCYCLINE HYCLATE 100 MG PO TABS: ORAL_TABLET | ORAL | 0 refills | Status: DC

## 2024-01-31 NOTE — Telephone Encounter (Signed)
 Patient called office today requesting refill on Doxy Pep and BP medication. Was told by pharmacy that prescriptions have expired. Refills sent today.  Patient scheduled for follow up with Dr. Ernie Heal and labs today. Julien Odor, RMA

## 2024-02-12 NOTE — Progress Notes (Unsigned)
   Subjective:   Chief complaint: follow-up for HIV disease on medications   Patient ID: Cody Barton, male    DOB: 1976-07-25, 48 y.o.   MRN: [id number]  HPI  Past Medical History:  Diagnosis Date   Atypical chest pain 04/08/2019   Epididymoorchitis 01/14/2020   Exposure to COVID-19 virus 09/09/2019   GERD (gastroesophageal reflux disease) 01/07/2019   Gonorrhea    HIV (human immunodeficiency virus infection) (HCC)    Hyperglycemia 01/04/2022   Hyperlipidemia 08/09/2022   Hypertension    Major depressive disorder, single episode 04/19/2016   Otitis media 12/16/2014   Seasonal allergies 12/16/2014   Sore throat 12/16/2014   Vaccine counseling 08/31/2020    Past Surgical History:  Procedure Laterality Date   TONSILLECTOMY Bilateral 07/29/2021   Procedure: TONSILLECTOMY;  Surgeon: Jesus Oliphant, MD;  Location: [facility] OR;  Service: ENT;  Laterality: Bilateral;    Family History  Problem Relation Age of Onset   Hypertension Father    Cancer Maternal Grandfather       Social History   Socioeconomic History   Marital status: Single    Spouse name: Not on file   Number of children: Not on file   Years of education: Not on file   Highest education level: Not on file  Occupational History   Not on file  Tobacco Use   Smoking status: Every Day    Current packs/day: 0.00    Average packs/day: 0.3 packs/day for 1.5 years (0.4 ttl pk-yrs)    Types: Cigars, Cigarettes    Start date: 02/21/2016    Last attempt to quit: 08/21/2017    Years since quitting: 6.4   Smokeless tobacco: Never  Vaping Use   Vaping status: Never Used  Substance and Sexual Activity   Alcohol use: Yes    Comment:    weekend   Drug use: Yes    Frequency: 3.0 times per week    Types: Marijuana    Comment: daily use    Sexual activity: Yes    Partners: Male    Birth control/protection: None    Comment: declined condoms  Other Topics Concern   Not on file  Social History Narrative   ** Merged  History Encounter **       ** Merged History Encounter **       Social Drivers of Corporate investment banker Strain: Not on file  Food Insecurity: Not on file  Transportation Needs: Not on file  Physical Activity: Not on file  Stress: Not on file  Social Connections: Not on file    No Known Allergies   Current Outpatient Medications:    amLODipine  (NORVASC ) 10 MG tablet, TAKE 1 TABLET(10 MG) BY MOUTH DAILY, Disp: 30 tablet, Rfl: 5   atorvastatin  (LIPITOR) 40 MG tablet, Take 1 tablet (40 mg total) by mouth daily., Disp: 30 tablet, Rfl: 11   dolutegravir -lamiVUDine  (DOVATO ) 50-300 MG tablet, Take 1 tablet by mouth daily., Disp: 30 tablet, Rfl: 11   doxycycline  (VIBRA -TABS) 100 MG tablet, Take 200 mg (2 tablets) after unprotected sex to prevent chlamydia and syphilis, Disp: 60 tablet, Rfl: 0   hydrochlorothiazide  (HYDRODIURIL ) 25 MG tablet, TAKE 1 TABLET(25 MG) BY MOUTH DAILY, Disp: 30 tablet, Rfl: 5    Review of Systems     Objective:   Physical Exam        Assessment & Plan:

## 2024-02-13 ENCOUNTER — Encounter: Payer: Self-pay | Admitting: Infectious Disease

## 2024-02-13 DIAGNOSIS — F17200 Nicotine dependence, unspecified, uncomplicated: Secondary | ICD-10-CM | POA: Insufficient documentation

## 2024-02-14 ENCOUNTER — Ambulatory Visit (INDEPENDENT_AMBULATORY_CARE_PROVIDER_SITE_OTHER): Admitting: Infectious Disease

## 2024-02-14 DIAGNOSIS — Z1212 Encounter for screening for malignant neoplasm of rectum: Secondary | ICD-10-CM

## 2024-02-14 DIAGNOSIS — I10 Essential (primary) hypertension: Secondary | ICD-10-CM

## 2024-02-14 DIAGNOSIS — E785 Hyperlipidemia, unspecified: Secondary | ICD-10-CM

## 2024-02-14 DIAGNOSIS — F17200 Nicotine dependence, unspecified, uncomplicated: Secondary | ICD-10-CM

## 2024-02-14 DIAGNOSIS — B20 Human immunodeficiency virus [HIV] disease: Secondary | ICD-10-CM

## 2024-02-15 ENCOUNTER — Other Ambulatory Visit: Payer: Self-pay

## 2024-02-15 ENCOUNTER — Other Ambulatory Visit (HOSPITAL_COMMUNITY)
Admission: RE | Admit: 2024-02-15 | Discharge: 2024-02-15 | Disposition: A | Discharge status: Home / Self Care | Source: Ambulatory Visit | Attending: Infectious Disease | Admitting: Infectious Disease

## 2024-02-15 ENCOUNTER — Other Ambulatory Visit

## 2024-02-15 DIAGNOSIS — F17200 Nicotine dependence, unspecified, uncomplicated: Secondary | ICD-10-CM | POA: Insufficient documentation

## 2024-02-15 DIAGNOSIS — B20 Human immunodeficiency virus [HIV] disease: Secondary | ICD-10-CM | POA: Insufficient documentation

## 2024-02-15 DIAGNOSIS — Z1212 Encounter for screening for malignant neoplasm of rectum: Secondary | ICD-10-CM

## 2024-02-15 DIAGNOSIS — E785 Hyperlipidemia, unspecified: Secondary | ICD-10-CM | POA: Insufficient documentation

## 2024-02-16 LAB — URINE CYTOLOGY ANCILLARY ONLY
Chlamydia: NEGATIVE
Comment: NORMAL
Comment: NEGATIVE
Neisseria Gonorrhea: NEGATIVE

## 2024-02-16 LAB — T-HELPER CELLS (CD4) COUNT (NOT AT ARMC)
CD4 % Helper T Cell: 33 % (ref 33–65)
CD4 T Cell Abs: 639 /uL (ref 400–1790)

## 2024-02-17 LAB — COMPLETE METABOLIC PANEL WITHOUT GFR
AG Ratio: 1.4 (calc) (ref 1.0–2.5)
ALT: 15 U/L (ref 9–46)
AST: 22 U/L (ref 10–40)
Albumin: 4.6 g/dL (ref 3.6–5.1)
Alkaline phosphatase (APISO): 88 U/L (ref 36–130)
BUN: 17 mg/dL (ref 7–25)
CO2: 30 mmol/L (ref 20–32)
Calcium: 10.1 mg/dL (ref 8.6–10.3)
Chloride: 99 mmol/L (ref 98–110)
Creat: 1.03 mg/dL (ref 0.60–1.29)
Globulin: 3.4 g/dL (ref 1.9–3.7)
Glucose, Bld: 124 mg/dL — ABNORMAL HIGH (ref 65–99)
Potassium: 3.7 mmol/L (ref 3.5–5.3)
Sodium: 140 mmol/L (ref 135–146)
Total Bilirubin: 1.3 mg/dL — ABNORMAL HIGH (ref 0.2–1.2)
Total Protein: 8 g/dL (ref 6.1–8.1)

## 2024-02-17 LAB — CBC WITH DIFFERENTIAL/PLATELET
Absolute Lymphocytes: 2268 {cells}/uL (ref 850–3900)
Absolute Monocytes: 394 {cells}/uL (ref 200–950)
Basophils Absolute: 59 {cells}/uL (ref 0–200)
Basophils Relative: 1.1 %
Eosinophils Absolute: 243 {cells}/uL (ref 15–500)
Eosinophils Relative: 4.5 %
HCT: 52 % — ABNORMAL HIGH (ref 38.5–50.0)
Hemoglobin: 16.6 g/dL (ref 13.2–17.1)
MCH: 31.3 pg (ref 27.0–33.0)
MCHC: 31.9 g/dL — ABNORMAL LOW (ref 32.0–36.0)
MCV: 98.1 fL (ref 80.0–100.0)
MPV: 9.5 fL (ref 7.5–12.5)
Monocytes Relative: 7.3 %
Neutro Abs: 2435 {cells}/uL (ref 1500–7800)
Neutrophils Relative %: 45.1 %
Platelets: 367 10*3/uL (ref 140–400)
RBC: 5.3 10*6/uL (ref 4.20–5.80)
RDW: 10.9 % — ABNORMAL LOW (ref 11.0–15.0)
Total Lymphocyte: 42 %
WBC: 5.4 10*3/uL (ref 3.8–10.8)

## 2024-02-17 LAB — HIV-1 RNA QUANT-NO REFLEX-BLD
HIV 1 RNA Quant: NOT DETECTED {copies}/mL
HIV-1 RNA Quant, Log: NOT DETECTED {Log_copies}/mL

## 2024-02-17 LAB — RPR: RPR Ser Ql: NONREACTIVE

## 2024-02-21 LAB — CYTOLOGY - PAP
Comment: NEGATIVE
Diagnosis: NEGATIVE
High risk HPV: NEGATIVE

## 2024-03-12 NOTE — Progress Notes (Unsigned)
 Subjective:  Chief complaint: follow-up for HIV disease on medications   Patient ID: Cody Barton, male    DOB: 09-Feb-1976, 48 y.o.   MRN: [id number]  HPI  Discussed the use of AI scribe software for clinical note transcription with the patient, who gave verbal consent to proceed.  History of Present Illness   Cody Barton is a 48 year old male with HIV who presents for routine follow-up.  He continues on Dovato  for HIV management. Recent labs from June show an undetectable viral load and a CD4 count of 639. No sexually transmitted infections are detected, and an anal Pap smear is normal. He requests a refill of doxycycline  for STI prevention post-sexual activity.       Past Medical History:  Diagnosis Date   Atypical chest pain 04/08/2019   Epididymoorchitis 01/14/2020   Exposure to COVID-19 virus 09/09/2019   GERD (gastroesophageal reflux disease) 01/07/2019   Gonorrhea    HIV (human immunodeficiency virus infection) (HCC)    Hyperglycemia 01/04/2022   Hyperlipidemia 08/09/2022   Hypertension    Major depressive disorder, single episode 04/19/2016   Otitis media 12/16/2014   Seasonal allergies 12/16/2014   Smoker 02/13/2024   Sore throat 12/16/2014   Vaccine counseling 08/31/2020    Past Surgical History:  Procedure Laterality Date   TONSILLECTOMY Bilateral 07/29/2021   Procedure: TONSILLECTOMY;  Surgeon: Jesus Oliphant, MD;  Location: [facility] OR;  Service: ENT;  Laterality: Bilateral;    Family History  Problem Relation Age of Onset   Hypertension Father    Cancer Maternal Grandfather       Social History   Socioeconomic History   Marital status: Single    Spouse name: Not on file   Number of children: Not on file   Years of education: Not on file   Highest education level: Not on file  Occupational History   Not on file  Tobacco Use   Smoking status: Every Day    Current packs/day: 0.00    Average packs/day: 0.3 packs/day for 1.5 years (0.4 ttl  pk-yrs)    Types: Cigars, Cigarettes    Start date: 02/21/2016    Last attempt to quit: 08/21/2017    Years since quitting: 6.5   Smokeless tobacco: Never  Vaping Use   Vaping status: Never Used  Substance and Sexual Activity   Alcohol use: Yes    Comment:    weekend   Drug use: Yes    Frequency: 3.0 times per week    Types: Marijuana    Comment: daily use    Sexual activity: Yes    Partners: Male    Birth control/protection: None    Comment: declined condoms  Other Topics Concern   Not on file  Social History Narrative   ** Merged History Encounter **       ** Merged History Encounter **       Social Drivers of Corporate investment banker Strain: Not on file  Food Insecurity: Not on file  Transportation Needs: Not on file  Physical Activity: Not on file  Stress: Not on file  Social Connections: Not on file    No Known Allergies   Current Outpatient Medications:    amLODipine  (NORVASC ) 10 MG tablet, TAKE 1 TABLET(10 MG) BY MOUTH DAILY, Disp: 30 tablet, Rfl: 5   atorvastatin  (LIPITOR) 40 MG tablet, Take 1 tablet (40 mg total) by mouth daily., Disp: 30 tablet, Rfl: 11   dolutegravir -lamiVUDine  (DOVATO ) 50-300 MG tablet, Take 1  tablet by mouth daily., Disp: 30 tablet, Rfl: 11   doxycycline  (VIBRA -TABS) 100 MG tablet, Take 200 mg (2 tablets) after unprotected sex to prevent chlamydia and syphilis, Disp: 60 tablet, Rfl: 0   hydrochlorothiazide  (HYDRODIURIL ) 25 MG tablet, TAKE 1 TABLET(25 MG) BY MOUTH DAILY, Disp: 30 tablet, Rfl: 5    Review of Systems  Constitutional:  Negative for activity change, appetite change, chills, diaphoresis, fatigue, fever and unexpected weight change.  HENT:  Negative for congestion, rhinorrhea, sinus pressure, sneezing, sore throat and trouble swallowing.   Eyes:  Negative for photophobia and visual disturbance.  Respiratory:  Negative for cough, chest tightness, shortness of breath, wheezing and stridor.   Cardiovascular:  Negative for  chest pain, palpitations and leg swelling.  Gastrointestinal:  Negative for abdominal distention, abdominal pain, anal bleeding, blood in stool, constipation, diarrhea, nausea and vomiting.  Genitourinary:  Negative for difficulty urinating, dysuria, flank pain and hematuria.  Musculoskeletal:  Negative for arthralgias, back pain, gait problem, joint swelling and myalgias.  Skin:  Negative for color change, pallor, rash and wound.  Neurological:  Negative for dizziness, tremors, weakness and light-headedness.  Hematological:  Negative for adenopathy. Does not bruise/bleed easily.  Psychiatric/Behavioral:  Negative for agitation, behavioral problems, confusion, decreased concentration, dysphoric mood and sleep disturbance.        Objective:   Physical Exam Constitutional:      Appearance: He is well-developed.  HENT:     Head: Normocephalic and atraumatic.  Eyes:     Conjunctiva/sclera: Conjunctivae normal.  Cardiovascular:     Rate and Rhythm: Normal rate and regular rhythm.  Pulmonary:     Effort: Pulmonary effort is normal. No respiratory distress.     Breath sounds: No wheezing.  Abdominal:     General: There is no distension.     Palpations: Abdomen is soft.  Musculoskeletal:        General: No tenderness. Normal range of motion.     Cervical back: Normal range of motion and neck supple.  Skin:    General: Skin is warm and dry.     Coloration: Skin is not pale.     Findings: No erythema or rash.  Neurological:     General: No focal deficit present.     Mental Status: He is alert and oriented to person, place, and time.  Psychiatric:        Mood and Affect: Mood normal.        Behavior: Behavior normal.        Thought Content: Thought content normal.        Judgment: Judgment normal.           Assessment & Plan:   Assessment and Plan    HIV infection, controlled HIV infection well-controlled with undetectable viral load and CD4 count of 639. No STIs detected.  Normal anal Pap smear. - Continue Dovato .   Rectal cancer screening: - Repeat anal Pap smear in one year.   Vaccine counseling:  - Administer HPV vaccine no 1 today  Hypertension Hypertension managed with hydrochlorothiazide  and amlodipine .  Hyperlipidemia Hyperlipidemia management with atorvastatin  ongoing.

## 2024-03-13 ENCOUNTER — Ambulatory Visit (INDEPENDENT_AMBULATORY_CARE_PROVIDER_SITE_OTHER): Admitting: Infectious Disease

## 2024-03-13 ENCOUNTER — Other Ambulatory Visit: Payer: Self-pay

## 2024-03-13 ENCOUNTER — Encounter: Payer: Self-pay | Admitting: Infectious Disease

## 2024-03-13 VITALS — BP 151/94 | HR 64 | Temp 97.8°F | Ht 73.0 in | Wt 192.0 lb

## 2024-03-13 DIAGNOSIS — Z23 Encounter for immunization: Secondary | ICD-10-CM | POA: Diagnosis present

## 2024-03-13 DIAGNOSIS — E785 Hyperlipidemia, unspecified: Secondary | ICD-10-CM | POA: Diagnosis not present

## 2024-03-13 DIAGNOSIS — Z7185 Encounter for immunization safety counseling: Secondary | ICD-10-CM

## 2024-03-13 DIAGNOSIS — Z1212 Encounter for screening for malignant neoplasm of rectum: Secondary | ICD-10-CM | POA: Diagnosis not present

## 2024-03-13 DIAGNOSIS — F17200 Nicotine dependence, unspecified, uncomplicated: Secondary | ICD-10-CM

## 2024-03-13 DIAGNOSIS — Z113 Encounter for screening for infections with a predominantly sexual mode of transmission: Secondary | ICD-10-CM | POA: Diagnosis not present

## 2024-03-13 DIAGNOSIS — B20 Human immunodeficiency virus [HIV] disease: Secondary | ICD-10-CM

## 2024-03-13 DIAGNOSIS — Z202 Contact with and (suspected) exposure to infections with a predominantly sexual mode of transmission: Secondary | ICD-10-CM | POA: Diagnosis not present

## 2024-03-13 DIAGNOSIS — M353 Polymyalgia rheumatica: Secondary | ICD-10-CM

## 2024-03-13 DIAGNOSIS — I10 Essential (primary) hypertension: Secondary | ICD-10-CM

## 2024-03-13 DIAGNOSIS — M791 Myalgia, unspecified site: Secondary | ICD-10-CM

## 2024-03-13 MED ORDER — HYDROCHLOROTHIAZIDE 25 MG PO TABS: ORAL_TABLET | ORAL | 5 refills | Status: DC

## 2024-03-13 MED ORDER — DOXYCYCLINE HYCLATE 100 MG PO TABS: ORAL_TABLET | ORAL | 0 refills | Status: DC

## 2024-03-13 MED ORDER — ATORVASTATIN CALCIUM 40 MG PO TABS: 40.0000 mg | ORAL_TABLET | Freq: Every day | ORAL | 11 refills | Status: AC

## 2024-03-13 MED ORDER — AMLODIPINE BESYLATE 10 MG PO TABS: 10.0000 mg | ORAL_TABLET | Freq: Every day | ORAL | 11 refills | Status: AC

## 2024-03-13 MED ORDER — DOVATO 50-300 MG PO TABS: 1.0000 | ORAL_TABLET | Freq: Every day | ORAL | 11 refills | Status: AC

## 2024-06-20 ENCOUNTER — Other Ambulatory Visit: Payer: Self-pay

## 2024-06-20 ENCOUNTER — Other Ambulatory Visit

## 2024-06-20 DIAGNOSIS — Z79899 Other long term (current) drug therapy: Secondary | ICD-10-CM

## 2024-06-20 DIAGNOSIS — Z113 Encounter for screening for infections with a predominantly sexual mode of transmission: Secondary | ICD-10-CM

## 2024-06-20 DIAGNOSIS — B20 Human immunodeficiency virus [HIV] disease: Secondary | ICD-10-CM

## 2024-08-29 ENCOUNTER — Other Ambulatory Visit: Payer: Self-pay

## 2024-08-29 ENCOUNTER — Telehealth: Payer: Self-pay

## 2024-08-29 ENCOUNTER — Other Ambulatory Visit: Payer: MEDICAID

## 2024-08-29 ENCOUNTER — Other Ambulatory Visit (HOSPITAL_COMMUNITY)
Admission: RE | Admit: 2024-08-29 | Discharge: 2024-08-29 | Disposition: A | Payer: MEDICAID | Source: Ambulatory Visit | Attending: Family | Admitting: Family

## 2024-08-29 DIAGNOSIS — B20 Human immunodeficiency virus [HIV] disease: Secondary | ICD-10-CM

## 2024-08-29 DIAGNOSIS — Z202 Contact with and (suspected) exposure to infections with a predominantly sexual mode of transmission: Secondary | ICD-10-CM

## 2024-08-29 DIAGNOSIS — Z113 Encounter for screening for infections with a predominantly sexual mode of transmission: Secondary | ICD-10-CM | POA: Insufficient documentation

## 2024-08-29 DIAGNOSIS — I10 Essential (primary) hypertension: Secondary | ICD-10-CM

## 2024-08-29 DIAGNOSIS — Z79899 Other long term (current) drug therapy: Secondary | ICD-10-CM

## 2024-08-29 MED ORDER — HYDROCHLOROTHIAZIDE 25 MG PO TABS: ORAL_TABLET | ORAL | 0 refills | Status: AC

## 2024-08-29 MED ORDER — DOXYCYCLINE HYCLATE 100 MG PO TABS: ORAL_TABLET | ORAL | 0 refills | Status: AC

## 2024-08-29 NOTE — Telephone Encounter (Signed)
 Notified by front desk that patient requested refill on all maintenance medications. Appointment scheduled for 1/23.   [facility], notified him he has refills on file for atorvastatin , amlodipine , and Dovato . He would like refills of hydrochlorothiazide  and PRN doxy.  Abi Shoults, BSN, RN

## 2024-08-30 LAB — T-HELPER CELL (CD4) - (RCID CLINIC ONLY)
CD4 % Helper T Cell: 36 % (ref 33–65)
CD4 T Cell Abs: 738 /uL (ref 400–1790)

## 2024-08-30 LAB — URINE CYTOLOGY ANCILLARY ONLY
Chlamydia: NEGATIVE
Comment: NEGATIVE
Comment: NORMAL
Neisseria Gonorrhea: NEGATIVE

## 2024-08-31 LAB — COMPLETE METABOLIC PANEL WITHOUT GFR
AG Ratio: 1.3 (calc) (ref 1.0–2.5)
ALT: 17 U/L (ref 9–46)
AST: 17 U/L (ref 10–40)
Albumin: 4.4 g/dL (ref 3.6–5.1)
Alkaline phosphatase (APISO): 67 U/L (ref 36–130)
BUN: 15 mg/dL (ref 7–25)
CO2: 29 mmol/L (ref 20–32)
Calcium: 9.8 mg/dL (ref 8.6–10.3)
Chloride: 102 mmol/L (ref 98–110)
Creat: 0.93 mg/dL (ref 0.60–1.29)
Globulin: 3.3 g/dL (ref 1.9–3.7)
Glucose, Bld: 114 mg/dL — ABNORMAL HIGH (ref 65–99)
Potassium: 3.8 mmol/L (ref 3.5–5.3)
Sodium: 139 mmol/L (ref 135–146)
Total Bilirubin: 0.6 mg/dL (ref 0.2–1.2)
Total Protein: 7.7 g/dL (ref 6.1–8.1)

## 2024-08-31 LAB — CBC WITH DIFFERENTIAL/PLATELET
Absolute Lymphocytes: 2336 {cells}/uL (ref 850–3900)
Absolute Monocytes: 677 {cells}/uL (ref 200–950)
Basophils Absolute: 49 {cells}/uL (ref 0–200)
Basophils Relative: 0.8 %
Eosinophils Absolute: 140 {cells}/uL (ref 15–500)
Eosinophils Relative: 2.3 %
HCT: 45.6 % (ref 39.4–51.1)
Hemoglobin: 14.7 g/dL (ref 13.2–17.1)
MCH: 30.6 pg (ref 27.0–33.0)
MCHC: 32.2 g/dL (ref 31.6–35.4)
MCV: 94.8 fL (ref 81.4–101.7)
MPV: 9.3 fL (ref 7.5–12.5)
Monocytes Relative: 11.1 %
Neutro Abs: 2898 {cells}/uL (ref 1500–7800)
Neutrophils Relative %: 47.5 %
Platelets: 387 Thousand/uL (ref 140–400)
RBC: 4.81 Million/uL (ref 4.20–5.80)
RDW: 11.2 % (ref 11.0–15.0)
Total Lymphocyte: 38.3 %
WBC: 6.1 Thousand/uL (ref 3.8–10.8)

## 2024-08-31 LAB — HIV-1 RNA QUANT-NO REFLEX-BLD
HIV 1 RNA Quant: NOT DETECTED {copies}/mL
HIV-1 RNA Quant, Log: NOT DETECTED {Log_copies}/mL

## 2024-08-31 LAB — SYPHILIS: RPR W/REFLEX TO RPR TITER AND TREPONEMAL ANTIBODIES, TRADITIONAL SCREENING AND DIAGNOSIS ALGORITHM: RPR Ser Ql: NONREACTIVE

## 2024-08-31 LAB — LIPID PANEL
Cholesterol: 129 mg/dL
HDL: 68 mg/dL
LDL Cholesterol (Calc): 48 mg/dL
Non-HDL Cholesterol (Calc): 61 mg/dL
Total CHOL/HDL Ratio: 1.9 (calc)
Triglycerides: 52 mg/dL

## 2024-09-13 ENCOUNTER — Ambulatory Visit: Payer: MEDICAID | Admitting: Family
